# Patient Record
Sex: Male | Born: 1955
Health system: Southern US, Community
[De-identification: ages and names within clinical notes are randomized; demographics above are authoritative.]

## PROBLEM LIST (undated history)

## (undated) DIAGNOSIS — D6862 Lupus anticoagulant syndrome: Secondary | ICD-10-CM

## (undated) DIAGNOSIS — I10 Essential (primary) hypertension: Secondary | ICD-10-CM

## (undated) DIAGNOSIS — M359 Systemic involvement of connective tissue, unspecified: Secondary | ICD-10-CM

## (undated) DIAGNOSIS — Z87442 Personal history of urinary calculi: Secondary | ICD-10-CM

## (undated) DIAGNOSIS — F419 Anxiety disorder, unspecified: Secondary | ICD-10-CM

## (undated) DIAGNOSIS — I82409 Acute embolism and thrombosis of unspecified deep veins of unspecified lower extremity: Secondary | ICD-10-CM

## (undated) DIAGNOSIS — R519 Headache, unspecified: Secondary | ICD-10-CM

## (undated) DIAGNOSIS — M1990 Unspecified osteoarthritis, unspecified site: Secondary | ICD-10-CM

## (undated) DIAGNOSIS — E119 Type 2 diabetes mellitus without complications: Secondary | ICD-10-CM

## (undated) DIAGNOSIS — G4730 Sleep apnea, unspecified: Secondary | ICD-10-CM

## (undated) DIAGNOSIS — K429 Umbilical hernia without obstruction or gangrene: Secondary | ICD-10-CM

## (undated) DIAGNOSIS — F32A Depression, unspecified: Secondary | ICD-10-CM

## (undated) HISTORY — PX: PLANTAR FASCIA SURGERY: SHX746

## (undated) HISTORY — PX: URETEROSCOPY WITH HOLMIUM LASER LITHOTRIPSY: SHX6645

## (undated) HISTORY — PX: ROTATOR CUFF REPAIR: SHX139

## (undated) HISTORY — PX: THUMB FUSION: SUR636

## (undated) HISTORY — PX: CERVICAL FUSION: SHX112

---

## 2005-12-01 ENCOUNTER — Inpatient Hospital Stay: Payer: Self-pay | Admitting: Internal Medicine

## 2007-11-26 ENCOUNTER — Ambulatory Visit: Payer: Self-pay | Admitting: Internal Medicine

## 2009-03-08 ENCOUNTER — Ambulatory Visit: Payer: Self-pay | Admitting: Gastroenterology

## 2010-01-05 ENCOUNTER — Ambulatory Visit: Payer: Self-pay | Admitting: Orthopedic Surgery

## 2010-03-29 ENCOUNTER — Ambulatory Visit: Payer: Self-pay | Admitting: Oncology

## 2010-04-29 LAB — HEXAGONAL PHOSPHOLIPID NEUTRALIZATION

## 2010-05-30 ENCOUNTER — Ambulatory Visit: Payer: Self-pay | Admitting: Oncology

## 2010-06-03 LAB — CBC WITH DIFFERENTIAL/PLATELET
BASO%: 1.3 % (ref 0.0–2.0)
Basophils Absolute: 0.1 10*3/uL (ref 0.0–0.1)
EOS%: 2.8 % (ref 0.0–7.0)
Eosinophils Absolute: 0.2 10*3/uL (ref 0.0–0.5)
HCT: 49.2 % (ref 38.4–49.9)
HGB: 16.5 g/dL (ref 13.0–17.1)
LYMPH%: 24.1 % (ref 14.0–49.0)
MCH: 30 pg (ref 27.2–33.4)
MCHC: 33.5 g/dL (ref 32.0–36.0)
MCV: 89.5 fL (ref 79.3–98.0)
MONO#: 0.5 10*3/uL (ref 0.1–0.9)
MONO%: 9.7 % (ref 0.0–14.0)
NEUT#: 3.3 10*3/uL (ref 1.5–6.5)
NEUT%: 62.1 % (ref 39.0–75.0)
Platelets: 185 10*3/uL (ref 140–400)
RBC: 5.5 10*6/uL (ref 4.20–5.82)
RDW: 14.6 % (ref 11.0–14.6)
WBC: 5.3 10*3/uL (ref 4.0–10.3)
lymph#: 1.3 10*3/uL (ref 0.9–3.3)
nRBC: 0 % (ref 0–0)

## 2010-06-03 LAB — PROTIME-INR
INR: 2.8 (ref 2.00–3.50)
Protime: 33.6 Seconds — ABNORMAL HIGH (ref 10.6–13.4)

## 2010-06-09 ENCOUNTER — Inpatient Hospital Stay (HOSPITAL_COMMUNITY): Admission: EM | Admit: 2010-06-09 | Discharge: 2010-06-17 | Payer: Self-pay | Admitting: Oncology

## 2010-06-10 ENCOUNTER — Ambulatory Visit: Payer: Self-pay | Admitting: Internal Medicine

## 2010-06-12 ENCOUNTER — Encounter: Payer: Self-pay | Admitting: Neurosurgery

## 2010-06-30 ENCOUNTER — Ambulatory Visit: Payer: Self-pay | Admitting: Oncology

## 2010-07-01 LAB — CBC WITH DIFFERENTIAL/PLATELET
BASO%: 0.8 % (ref 0.0–2.0)
Basophils Absolute: 0.1 10*3/uL (ref 0.0–0.1)
EOS%: 2.7 % (ref 0.0–7.0)
Eosinophils Absolute: 0.2 10*3/uL (ref 0.0–0.5)
HCT: 43.8 % (ref 38.4–49.9)
HGB: 15 g/dL (ref 13.0–17.1)
LYMPH%: 19.8 % (ref 14.0–49.0)
MCH: 29.8 pg (ref 27.2–33.4)
MCHC: 34.2 g/dL (ref 32.0–36.0)
MCV: 86.9 fL (ref 79.3–98.0)
MONO#: 0.5 10*3/uL (ref 0.1–0.9)
MONO%: 6.8 % (ref 0.0–14.0)
NEUT#: 5.2 10*3/uL (ref 1.5–6.5)
NEUT%: 69.9 % (ref 39.0–75.0)
Platelets: 304 10*3/uL (ref 140–400)
RBC: 5.04 10*6/uL (ref 4.20–5.82)
RDW: 13.9 % (ref 11.0–14.6)
WBC: 7.4 10*3/uL (ref 4.0–10.3)
lymph#: 1.5 10*3/uL (ref 0.9–3.3)
nRBC: 0 % (ref 0–0)

## 2010-07-01 LAB — PROTIME-INR
INR: 2.5 (ref 2.00–3.50)
Protime: 30 Seconds — ABNORMAL HIGH (ref 10.6–13.4)

## 2010-09-17 ENCOUNTER — Ambulatory Visit: Payer: Self-pay | Admitting: Internal Medicine

## 2010-10-11 ENCOUNTER — Other Ambulatory Visit: Payer: Self-pay | Admitting: Neurosurgery

## 2010-10-22 LAB — BASIC METABOLIC PANEL
BUN: 13 mg/dL (ref 6–23)
BUN: 9 mg/dL (ref 6–23)
CO2: 29 mEq/L (ref 19–32)
CO2: 29 mEq/L (ref 19–32)
Calcium: 8.9 mg/dL (ref 8.4–10.5)
Calcium: 9.1 mg/dL (ref 8.4–10.5)
Chloride: 102 mEq/L (ref 96–112)
Chloride: 102 mEq/L (ref 96–112)
Creatinine, Ser: 1.27 mg/dL (ref 0.4–1.5)
Creatinine, Ser: 1.34 mg/dL (ref 0.4–1.5)
GFR calc Af Amer: >60 mL/min (ref >60)
GFR calc Af Amer: >60 mL/min (ref >60)
GFR calc non Af Amer: 56 mL/min — ABNORMAL LOW (ref >60)
GFR calc non Af Amer: 59 mL/min — ABNORMAL LOW (ref >60)
Glucose, Bld: 125 mg/dL — ABNORMAL HIGH (ref 70–99)
Glucose, Bld: 161 mg/dL — ABNORMAL HIGH (ref 70–99)
Potassium: 4.3 mEq/L (ref 3.5–5.1)
Potassium: 4.8 mEq/L (ref 3.5–5.1)
Sodium: 136 mEq/L (ref 135–145)
Sodium: 137 mEq/L (ref 135–145)

## 2010-10-22 LAB — HEPARIN LEVEL (UNFRACTIONATED)
Heparin Unfractionated: 0.59 IU/mL (ref 0.30–0.70)
Heparin Unfractionated: <0.1 IU/mL — ABNORMAL LOW (ref 0.30–0.70)
Heparin Unfractionated: <0.1 IU/mL — ABNORMAL LOW (ref 0.30–0.70)
Heparin Unfractionated: <0.1 IU/mL — ABNORMAL LOW (ref 0.30–0.70)
Heparin Unfractionated: <0.1 IU/mL — ABNORMAL LOW (ref 0.30–0.70)

## 2010-10-22 LAB — CBC
HCT: 42.3 % (ref 39.0–52.0)
HCT: 42.4 % (ref 39.0–52.0)
HCT: 43.3 % (ref 39.0–52.0)
HCT: 45.5 % (ref 39.0–52.0)
HCT: 46.1 % (ref 39.0–52.0)
HCT: 47.1 % (ref 39.0–52.0)
HCT: 47.3 % (ref 39.0–52.0)
Hemoglobin: 14 g/dL (ref 13.0–17.0)
Hemoglobin: 14.1 g/dL (ref 13.0–17.0)
Hemoglobin: 14.4 g/dL (ref 13.0–17.0)
Hemoglobin: 15.2 g/dL (ref 13.0–17.0)
Hemoglobin: 15.6 g/dL (ref 13.0–17.0)
Hemoglobin: 15.8 g/dL (ref 13.0–17.0)
Hemoglobin: 15.9 g/dL (ref 13.0–17.0)
MCH: 29.5 pg (ref 26.0–34.0)
MCH: 29.6 pg (ref 26.0–34.0)
MCH: 29.9 pg (ref 26.0–34.0)
MCH: 29.9 pg (ref 26.0–34.0)
MCH: 29.9 pg (ref 26.0–34.0)
MCH: 30 pg (ref 26.0–34.0)
MCH: 30.1 pg (ref 26.0–34.0)
MCHC: 33.1 g/dL (ref 30.0–36.0)
MCHC: 33.3 g/dL (ref 30.0–36.0)
MCHC: 33.3 g/dL (ref 30.0–36.0)
MCHC: 33.4 g/dL (ref 30.0–36.0)
MCHC: 33.4 g/dL (ref 30.0–36.0)
MCHC: 33.8 g/dL (ref 30.0–36.0)
MCHC: 33.8 g/dL (ref 30.0–36.0)
MCV: 87.5 fL (ref 78.0–100.0)
MCV: 88.7 fL (ref 78.0–100.0)
MCV: 88.8 fL (ref 78.0–100.0)
MCV: 89.6 fL (ref 78.0–100.0)
MCV: 89.8 fL (ref 78.0–100.0)
MCV: 90 fL (ref 78.0–100.0)
MCV: 90.2 fL (ref 78.0–100.0)
Platelets: 100 10*3/uL — ABNORMAL LOW (ref 150–400)
Platelets: 114 10*3/uL — ABNORMAL LOW (ref 150–400)
Platelets: 125 10*3/uL — ABNORMAL LOW (ref 150–400)
Platelets: 176 10*3/uL (ref 150–400)
Platelets: 182 10*3/uL (ref 150–400)
Platelets: 90 10*3/uL — ABNORMAL LOW (ref 150–400)
Platelets: 93 10*3/uL — ABNORMAL LOW (ref 150–400)
RBC: 4.69 MIL/uL (ref 4.22–5.81)
RBC: 4.71 MIL/uL (ref 4.22–5.81)
RBC: 4.88 MIL/uL (ref 4.22–5.81)
RBC: 5.08 MIL/uL (ref 4.22–5.81)
RBC: 5.19 MIL/uL (ref 4.22–5.81)
RBC: 5.27 MIL/uL (ref 4.22–5.81)
RBC: 5.38 MIL/uL (ref 4.22–5.81)
RDW: 13.8 % (ref 11.5–15.5)
RDW: 13.8 % (ref 11.5–15.5)
RDW: 13.8 % (ref 11.5–15.5)
RDW: 13.9 % (ref 11.5–15.5)
RDW: 14.1 % (ref 11.5–15.5)
RDW: 14.1 % (ref 11.5–15.5)
RDW: 14.1 % (ref 11.5–15.5)
WBC: 5 10*3/uL (ref 4.0–10.5)
WBC: 6.3 10*3/uL (ref 4.0–10.5)
WBC: 6.4 10*3/uL (ref 4.0–10.5)
WBC: 6.5 10*3/uL (ref 4.0–10.5)
WBC: 6.7 10*3/uL (ref 4.0–10.5)
WBC: 7.8 10*3/uL (ref 4.0–10.5)
WBC: 7.9 10*3/uL (ref 4.0–10.5)

## 2010-10-22 LAB — PROTIME-INR
INR: 0.96 (ref 0.00–1.49)
INR: 1.03 (ref 0.00–1.49)
Prothrombin Time: 13 seconds (ref 11.6–15.2)
Prothrombin Time: 13.7 seconds (ref 11.6–15.2)

## 2010-10-22 LAB — APTT
aPTT: 45 seconds — ABNORMAL HIGH (ref 24–37)
aPTT: 46 seconds — ABNORMAL HIGH (ref 24–37)

## 2010-10-22 LAB — DIFFERENTIAL
Basophils Absolute: 0.1 10*3/uL (ref 0.0–0.1)
Basophils Relative: 1 % (ref 0–1)
Eosinophils Absolute: 0.3 10*3/uL (ref 0.0–0.7)
Eosinophils Relative: 4 % (ref 0–5)
Lymphocytes Relative: 22 % (ref 12–46)
Lymphs Abs: 1.5 10*3/uL (ref 0.7–4.0)
Monocytes Absolute: 0.6 10*3/uL (ref 0.1–1.0)
Monocytes Relative: 9 % (ref 3–12)
Neutro Abs: 4.1 10*3/uL (ref 1.7–7.7)
Neutrophils Relative %: 63 % (ref 43–77)

## 2010-10-22 LAB — HEPARIN INDUCED THROMBOCYTOPENIA PNL
Heparin Induced Plt Ab: NEGATIVE
Patient O.D.: 0.321
UFH High Dose UFH H: 5 % Release
UFH Low Dose 0.1 IU/mL: 2 % Release
UFH Low Dose 0.5 IU/mL: 13 % Release
UFH SRA Result: NEGATIVE

## 2010-10-22 LAB — MRSA PCR SCREENING: MRSA by PCR: NEGATIVE

## 2010-10-23 LAB — TYPE AND SCREEN
ABO/RH(D): A NEG
Antibody Screen: NEGATIVE

## 2010-10-23 LAB — CBC
HCT: 44.5 % (ref 39.0–52.0)
HCT: 46.9 % (ref 39.0–52.0)
Hemoglobin: 15.1 g/dL (ref 13.0–17.0)
Hemoglobin: 15.5 g/dL (ref 13.0–17.0)
MCH: 29.6 pg (ref 26.0–34.0)
MCH: 29.8 pg (ref 26.0–34.0)
MCHC: 33 g/dL (ref 30.0–36.0)
MCHC: 33.9 g/dL (ref 30.0–36.0)
MCV: 87.8 fL (ref 78.0–100.0)
MCV: 89.5 fL (ref 78.0–100.0)
Platelets: 183 10*3/uL (ref 150–400)
Platelets: 184 10*3/uL (ref 150–400)
RBC: 5.07 MIL/uL (ref 4.22–5.81)
RBC: 5.24 MIL/uL (ref 4.22–5.81)
RDW: 13.9 % (ref 11.5–15.5)
RDW: 14.1 % (ref 11.5–15.5)
WBC: 5.2 10*3/uL (ref 4.0–10.5)
WBC: 6.2 10*3/uL (ref 4.0–10.5)

## 2010-10-23 LAB — DIFFERENTIAL
Basophils Absolute: 0.1 10*3/uL (ref 0.0–0.1)
Basophils Relative: 1 % (ref 0–1)
Eosinophils Absolute: 0.2 10*3/uL (ref 0.0–0.7)
Eosinophils Relative: 3 % (ref 0–5)
Lymphocytes Relative: 26 % (ref 12–46)
Lymphs Abs: 1.6 10*3/uL (ref 0.7–4.0)
Monocytes Absolute: 0.7 10*3/uL (ref 0.1–1.0)
Monocytes Relative: 11 % (ref 3–12)
Neutro Abs: 3.7 10*3/uL (ref 1.7–7.7)
Neutrophils Relative %: 60 % (ref 43–77)

## 2010-10-23 LAB — HEPARIN LEVEL (UNFRACTIONATED)
Heparin Unfractionated: 0.19 IU/mL — ABNORMAL LOW (ref 0.30–0.70)
Heparin Unfractionated: 0.23 IU/mL — ABNORMAL LOW (ref 0.30–0.70)
Heparin Unfractionated: 0.3 IU/mL (ref 0.30–0.70)

## 2010-10-23 LAB — COMPREHENSIVE METABOLIC PANEL
ALT: 41 U/L (ref 0–53)
AST: 29 U/L (ref 0–37)
Albumin: 3.6 g/dL (ref 3.5–5.2)
Alkaline Phosphatase: 26 U/L — ABNORMAL LOW (ref 39–117)
BUN: 21 mg/dL (ref 6–23)
CO2: 24 mEq/L (ref 19–32)
Calcium: 9 mg/dL (ref 8.4–10.5)
Chloride: 107 mEq/L (ref 96–112)
Creatinine, Ser: 1.47 mg/dL (ref 0.4–1.5)
GFR calc Af Amer: >60 mL/min (ref >60)
GFR calc non Af Amer: 50 mL/min — ABNORMAL LOW (ref >60)
Glucose, Bld: 130 mg/dL — ABNORMAL HIGH (ref 70–99)
Potassium: 3.7 mEq/L (ref 3.5–5.1)
Sodium: 137 mEq/L (ref 135–145)
Total Bilirubin: 0.9 mg/dL (ref 0.3–1.2)
Total Protein: 5.9 g/dL — ABNORMAL LOW (ref 6.0–8.3)

## 2010-10-23 LAB — PROTIME-INR
INR: 1.08 (ref 0.00–1.49)
INR: 1.08 (ref 0.00–1.49)
Prothrombin Time: 14.2 seconds (ref 11.6–15.2)
Prothrombin Time: 14.2 seconds (ref 11.6–15.2)

## 2010-10-23 LAB — LACTATE DEHYDROGENASE: LDH: 193 U/L (ref 94–250)

## 2010-10-23 LAB — ABO/RH: ABO/RH(D): A NEG

## 2010-10-23 LAB — APTT: aPTT: 50 seconds — ABNORMAL HIGH (ref 24–37)

## 2011-10-22 ENCOUNTER — Ambulatory Visit: Payer: Self-pay | Admitting: Specialist

## 2012-11-06 ENCOUNTER — Ambulatory Visit: Payer: Self-pay | Admitting: Neurosurgery

## 2013-12-21 DIAGNOSIS — M5090 Cervical disc disorder, unspecified, unspecified cervical region: Secondary | ICD-10-CM | POA: Insufficient documentation

## 2013-12-21 DIAGNOSIS — I82409 Acute embolism and thrombosis of unspecified deep veins of unspecified lower extremity: Secondary | ICD-10-CM | POA: Insufficient documentation

## 2013-12-21 DIAGNOSIS — D6862 Lupus anticoagulant syndrome: Secondary | ICD-10-CM | POA: Insufficient documentation

## 2014-05-23 DIAGNOSIS — I10 Essential (primary) hypertension: Secondary | ICD-10-CM | POA: Insufficient documentation

## 2016-01-09 DIAGNOSIS — E782 Mixed hyperlipidemia: Secondary | ICD-10-CM | POA: Insufficient documentation

## 2018-05-25 DIAGNOSIS — E1169 Type 2 diabetes mellitus with other specified complication: Secondary | ICD-10-CM | POA: Insufficient documentation

## 2018-07-01 DIAGNOSIS — M6530 Trigger finger, unspecified finger: Secondary | ICD-10-CM | POA: Insufficient documentation

## 2018-09-28 DIAGNOSIS — D6862 Lupus anticoagulant syndrome: Secondary | ICD-10-CM | POA: Diagnosis not present

## 2018-09-28 DIAGNOSIS — I82401 Acute embolism and thrombosis of unspecified deep veins of right lower extremity: Secondary | ICD-10-CM | POA: Diagnosis not present

## 2018-09-28 DIAGNOSIS — G5702 Lesion of sciatic nerve, left lower limb: Secondary | ICD-10-CM | POA: Diagnosis not present

## 2018-10-07 DIAGNOSIS — G4733 Obstructive sleep apnea (adult) (pediatric): Secondary | ICD-10-CM | POA: Diagnosis not present

## 2019-06-15 DIAGNOSIS — M961 Postlaminectomy syndrome, not elsewhere classified: Secondary | ICD-10-CM | POA: Insufficient documentation

## 2019-07-26 ENCOUNTER — Ambulatory Visit (INDEPENDENT_AMBULATORY_CARE_PROVIDER_SITE_OTHER): Payer: Commercial Managed Care - PPO | Admitting: Pharmacist

## 2019-07-26 ENCOUNTER — Encounter (INDEPENDENT_AMBULATORY_CARE_PROVIDER_SITE_OTHER): Payer: Self-pay

## 2019-07-26 ENCOUNTER — Other Ambulatory Visit: Payer: Self-pay

## 2019-07-26 DIAGNOSIS — Z7901 Long term (current) use of anticoagulants: Secondary | ICD-10-CM | POA: Diagnosis not present

## 2019-07-26 DIAGNOSIS — D6862 Lupus anticoagulant syndrome: Secondary | ICD-10-CM

## 2019-07-26 LAB — POCT INR: INR: 1 — AB (ref 2.0–3.0)

## 2020-03-29 ENCOUNTER — Other Ambulatory Visit: Payer: Self-pay | Admitting: Physician Assistant

## 2020-03-29 DIAGNOSIS — R319 Hematuria, unspecified: Secondary | ICD-10-CM

## 2020-04-10 ENCOUNTER — Other Ambulatory Visit: Payer: Self-pay

## 2020-04-10 ENCOUNTER — Ambulatory Visit
Admission: RE | Admit: 2020-04-10 | Discharge: 2020-04-10 | Disposition: A | Discharge status: Home / Self Care | Payer: Commercial Managed Care - PPO | Source: Ambulatory Visit | Attending: Physician Assistant | Admitting: Physician Assistant

## 2020-04-10 DIAGNOSIS — R319 Hematuria, unspecified: Secondary | ICD-10-CM | POA: Diagnosis present

## 2020-04-10 HISTORY — DX: Systemic involvement of connective tissue, unspecified: M35.9

## 2020-04-10 HISTORY — DX: Type 2 diabetes mellitus without complications: E11.9

## 2020-04-10 HISTORY — DX: Essential (primary) hypertension: I10

## 2020-04-10 MED ORDER — IOHEXOL 300 MG/ML  SOLN
125.0000 mL | Freq: Once | INTRAMUSCULAR | Status: AC | PRN
  Administered 2020-04-10: 125 mL via INTRAVENOUS

## 2020-04-12 ENCOUNTER — Ambulatory Visit: Payer: Commercial Managed Care - PPO | Admitting: Urology

## 2020-04-13 NOTE — Progress Notes (Signed)
04/17/2020 4:32 PM   Eddie Ryan 07-04-1956 [id number]  Referring provider: Mortimer Fries, PA-C [facility] [place],  [place] [address] Chief Complaint  Patient presents with   urothelial lesion    HPI: Eddie Ryan is a 64 y.o. male who presents today for evaluation and management of urothelial lesion.   UA on 03/28/20 noted gross hematuria, >50 RBCs and rare bacteria. Culture grew enterococcus faecalis   CT A/P with and without contrast on 04/10/2020 revealed in addition to a 1.4 cm calculus in the right renal pelvis, there is thickening of the urothelium in the right renal pelvis and a urothelial lesion starting at and extending immediately beyond the right ureteropelvic junction into the proximal third of the right ureter, highly concerning for primary urothelial neoplasm. Multiple Bosniak class 1 and Bosniak class 2 cysts in the kidneys bilaterally, as above.  He reports bleeding for 6-7 months. He reports bleeding for a 2-3 days. Symptoms having been on going since 08/2019. His has taken pain medication to control pain. He reports 1 kidney stone in the past that was managed by Dr. Bernardo Heater 20-25 years ago.   The patient is on Coumadin for lupus anticoagulant.  When he stated surgery in the past, he is had a Lovenox bridge.  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 Patient is unsure if he has been exposed to benzene.  Remote history of smoking, smoked for about 8 years.  He reports that cancer runs heavily on his father's side of the family. His father has pancreatic cancer. He grandfather had prostate cancer. His nephew passed from leukemia. Denies family history of bladder cancer.     PMH: Past Medical History:  Diagnosis Date   Collagen vascular disease ([facility])    Diabetes mellitus without complication ([facility])    Hypertension     Surgical History: History reviewed. No pertinent surgical history.  Home  Medications:  Allergies as of 04/17/2020      Reactions   Losartan Potassium-hctz Other (See Comments)      Medication List       Accurate as of April 17, 2020  4:32 PM. If you have any questions, ask your nurse or doctor.        STOP taking these medications   clindamycin-benzoyl peroxide gel Commonly known as: BENZACLIN Stopped by: Hollice Espy, MD   diclofenac Sodium 1 % Gel Commonly known as: VOLTAREN Stopped by: Hollice Espy, MD   enoxaparin 60 MG/0.6ML injection Commonly known as: LOVENOX Stopped by: Hollice Espy, MD   traMADol 50 MG tablet Commonly known as: ULTRAM Stopped by: Hollice Espy, MD     TAKE these medications   acetaminophen 500 MG tablet Commonly known as: TYLENOL Take by mouth.   ALPRAZolam 0.5 MG tablet Commonly known as: XANAX Take 0.5 mg by mouth 4 (four) times daily as needed.   buPROPion 150 MG 24 hr tablet Commonly known as: WELLBUTRIN XL Take 150 mg by mouth every morning.   Cholecalciferol 50 MCG (2000 UT) Tabs Take by mouth.   Cyanocobalamin 1000 MCG Subl Take by mouth.   diclofenac 50 MG EC tablet Commonly known as: VOLTAREN Take 1 tablet by mouth 2 (two) times daily with a meal.   DSS 100 MG Caps Take by mouth.   ergocalciferol 1.25 MG (50000 UT) capsule Commonly known as: VITAMIN D2 ergocalciferol (vitamin D2) 1,250 mcg (50,000 unit) capsule   escitalopram 20 MG tablet Commonly known as:  LEXAPRO Take by mouth.   gabapentin 300 MG capsule Commonly known as: NEURONTIN Take 300 mg by mouth daily.   loratadine 10 MG tablet Commonly known as: CLARITIN Take by mouth.   metFORMIN 500 MG tablet Commonly known as: GLUCOPHAGE Take 500 mg by mouth 2 (two) times daily.   sildenafil 100 MG tablet Commonly known as: VIAGRA Take by mouth.   telmisartan 80 MG tablet Commonly known as: MICARDIS   V-R MAGNESIUM 250 MG Tabs Generic drug: Magnesium Take by mouth.   warfarin 5 MG tablet Commonly known as:  COUMADIN warfarin 5 mg tablet  TAKE 1 TABLET (5 MG TOTAL) BY MOUTH NIGHTLY AND TAKE 1.5 TABLETS ON SATURDAY       Allergies:  Allergies  Allergen Reactions   Losartan Potassium-Hctz Other (See Comments)    Family History: History reviewed. No pertinent family history.  Social History:  has no history on file for tobacco use, alcohol use, and drug use.   Physical Exam: BP (!) 149/88    Pulse 86    Ht 6\' 2"  (1.88 m)    Wt 230 lb (104.3 kg)    BMI 29.53 kg/m   Constitutional:  Alert and oriented, No acute distress. HEENT: [place] AT, moist mucus membranes.  Trachea midline, no masses. Cardiovascular: No clubbing, cyanosis, or edema. Respiratory: Normal respiratory effort, no increased work of breathing. Skin: No rashes, bruises or suspicious lesions. Neurologic: Grossly intact, no focal deficits, moving all 4 extremities. Psychiatric: Normal mood and affect.  Urinalysis 3-10 RBCs.    CT abdomen pelvis (urogram) performed on 04/10/2020 was personally reviewed today.  Agree with radiologic interpretation.  There is a large at least 2.6 cm lesion involving the proximal third of the right ureter with luminal narrowing highly concerning for upper tract urothelial carcinoma.  There is also a nonobstructing 1.4 cm right renal pelvic stone.  Assessment & Plan:    1. Right renal pelvic stone 1.4 cm right renal calculus, likely cause of intermittent gross hematuria versus #2  Please see below.  We discussed today that we can address the stone at the same time as further intervention.  Risks and benefits of ureteroscopy were reviewed including but not limited to infection, bleeding, pain, ureteral injury which could require open surgery versus prolonged indwelling if ureteral perforation occurs, persistent stone disease, requirement for staged procedure, possible stent, and global anesthesia risks. Patient expressed understanding and desires to proceed with ureteroscopy.  -Urine culture and  cytology sent.   2. Right ureteral mass We discussed the differential diagnosis for this lesion which highly concerning for urothelial carcinoma.  There is no obvious metastatic disease.  I recommended proceeding on a fairly urgent basis once he is cleared from medical standpoint/anticoagulation for direct visualization via ureteroscopy, biopsy, and possible ablation.  We did briefly discuss that if this does in fact represent urothelial carcinoma, standard of care would be neoadjuvant chemo followed by nephro ureterectomy versus ablative therapy depending on if it is high-grade or low-grade.  He understands.  Discussed risk and benefits of ureteroscopy.   Patient is on Coumadin secondary to lupus anticoagulant. Will need Lovenox bridge by Dr. Sabra Heck.   [facility] [address], [place], [place] [address] [phone]  I, Selena Batten, am acting as a scribe for Dr. Hollice Espy.  I have reviewed the above documentation for accuracy and completeness, and I agree with the above.   Hollice Espy, MD  I spent 60 total minutes on the  day of the encounter including pre-visit review of the medical record, face-to-face time with the patient, and post visit ordering of labs/imaging/tests.

## 2020-04-13 NOTE — H&P (View-Only) (Signed)
04/17/2020 4:32 PM   Eddie Ryan Dec 11, 1955 [id number]  Referring provider: Mortimer Fries, PA-C [facility] [place],  [place] [address] Chief Complaint  Patient presents with  . urothelial lesion    HPI: Eddie Ryan is a 64 y.o. male who presents today for evaluation and management of urothelial lesion.   UA on 03/28/20 noted gross hematuria, >50 RBCs and rare bacteria. Culture grew enterococcus faecalis   CT A/P with and without contrast on 04/10/2020 revealed in addition to a 1.4 cm calculus in the right renal pelvis, there is thickening of the urothelium in the right renal pelvis and a urothelial lesion starting at and extending immediately beyond the right ureteropelvic junction into the proximal third of the right ureter, highly concerning for primary urothelial neoplasm. Multiple Bosniak class 1 and Bosniak class 2 cysts in the kidneys bilaterally, as above.  He reports bleeding for 6-7 months. He reports bleeding for a 2-3 days. Symptoms having been on going since 08/2019. His has taken pain medication to control pain. He reports 1 kidney stone in the past that was managed by Dr. Bernardo Heater 20-25 years ago.   The patient is on Coumadin for lupus anticoagulant.  When he stated surgery in the past, he is had a Lovenox bridge.  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 Patient is unsure if he has been exposed to benzene.  Remote history of smoking, smoked for about 8 years.  He reports that cancer runs heavily on his father's side of the family. His father has pancreatic cancer. He grandfather had prostate cancer. His nephew passed from leukemia. Denies family history of bladder cancer.     PMH: Past Medical History:  Diagnosis Date  . Collagen vascular disease ([facility])   . Diabetes mellitus without complication ([facility])   . Hypertension     Surgical History: History reviewed. No pertinent surgical history.  Home  Medications:  Allergies as of 04/17/2020      Reactions   Losartan Potassium-hctz Other (See Comments)      Medication List       Accurate as of April 17, 2020  4:32 PM. If you have any questions, ask your nurse or doctor.        STOP taking these medications   clindamycin-benzoyl peroxide gel Commonly known as: BENZACLIN Stopped by: Hollice Espy, MD   diclofenac Sodium 1 % Gel Commonly known as: VOLTAREN Stopped by: Hollice Espy, MD   enoxaparin 60 MG/0.6ML injection Commonly known as: LOVENOX Stopped by: Hollice Espy, MD   traMADol 50 MG tablet Commonly known as: ULTRAM Stopped by: Hollice Espy, MD     TAKE these medications   acetaminophen 500 MG tablet Commonly known as: TYLENOL Take by mouth.   ALPRAZolam 0.5 MG tablet Commonly known as: XANAX Take 0.5 mg by mouth 4 (four) times daily as needed.   buPROPion 150 MG 24 hr tablet Commonly known as: WELLBUTRIN XL Take 150 mg by mouth every morning.   Cholecalciferol 50 MCG (2000 UT) Tabs Take by mouth.   Cyanocobalamin 1000 MCG Subl Take by mouth.   diclofenac 50 MG EC tablet Commonly known as: VOLTAREN Take 1 tablet by mouth 2 (two) times daily with a meal.   DSS 100 MG Caps Take by mouth.   ergocalciferol 1.25 MG (50000 UT) capsule Commonly known as: VITAMIN D2 ergocalciferol (vitamin D2) 1,250 mcg (50,000 unit) capsule   escitalopram 20 MG tablet Commonly known as:  LEXAPRO Take by mouth.   gabapentin 300 MG capsule Commonly known as: NEURONTIN Take 300 mg by mouth daily.   loratadine 10 MG tablet Commonly known as: CLARITIN Take by mouth.   metFORMIN 500 MG tablet Commonly known as: GLUCOPHAGE Take 500 mg by mouth 2 (two) times daily.   sildenafil 100 MG tablet Commonly known as: VIAGRA Take by mouth.   telmisartan 80 MG tablet Commonly known as: MICARDIS   V-R MAGNESIUM 250 MG Tabs Generic drug: Magnesium Take by mouth.   warfarin 5 MG tablet Commonly known as:  COUMADIN warfarin 5 mg tablet  TAKE 1 TABLET (5 MG TOTAL) BY MOUTH NIGHTLY AND TAKE 1.5 TABLETS ON SATURDAY       Allergies:  Allergies  Allergen Reactions  . Losartan Potassium-Hctz Other (See Comments)    Family History: History reviewed. No pertinent family history.  Social History:  has no history on file for tobacco use, alcohol use, and drug use.   Physical Exam: BP (!) 149/88   Pulse 86   Ht 6\' 2"  (1.88 m)   Wt 230 lb (104.3 kg)   BMI 29.53 kg/m   Constitutional:  Alert and oriented, No acute distress. HEENT: [place] AT, moist mucus membranes.  Trachea midline, no masses. Cardiovascular: No clubbing, cyanosis, or edema. Respiratory: Normal respiratory effort, no increased work of breathing. Skin: No rashes, bruises or suspicious lesions. Neurologic: Grossly intact, no focal deficits, moving all 4 extremities. Psychiatric: Normal mood and affect.  Urinalysis 3-10 RBCs.    CT abdomen pelvis (urogram) performed on 04/10/2020 was personally reviewed today.  Agree with radiologic interpretation.  There is a large at least 2.6 cm lesion involving the proximal third of the right ureter with luminal narrowing highly concerning for upper tract urothelial carcinoma.  There is also a nonobstructing 1.4 cm right renal pelvic stone.  Assessment & Plan:    1. Right renal pelvic stone 1.4 cm right renal calculus, likely cause of intermittent gross hematuria versus #2  Please see below.  We discussed today that we can address the stone at the same time as further intervention.  Risks and benefits of ureteroscopy were reviewed including but not limited to infection, bleeding, pain, ureteral injury which could require open surgery versus prolonged indwelling if ureteral perforation occurs, persistent stone disease, requirement for staged procedure, possible stent, and global anesthesia risks. Patient expressed understanding and desires to proceed with ureteroscopy.  -Urine culture and  cytology sent.   2. Right ureteral mass We discussed the differential diagnosis for this lesion which highly concerning for urothelial carcinoma.  There is no obvious metastatic disease.  I recommended proceeding on a fairly urgent basis once he is cleared from medical standpoint/anticoagulation for direct visualization via ureteroscopy, biopsy, and possible ablation.  We did briefly discuss that if this does in fact represent urothelial carcinoma, standard of care would be neoadjuvant chemo followed by nephro ureterectomy versus ablative therapy depending on if it is high-grade or low-grade.  He understands.  Discussed risk and benefits of ureteroscopy.   Patient is on Coumadin secondary to lupus anticoagulant. Will need Lovenox bridge by Dr. Sabra Heck.   [facility] [address], [place],  [address] [phone]  I, Selena Batten, am acting as a scribe for Dr. Hollice Espy.  I have reviewed the above documentation for accuracy and completeness, and I agree with the above.   Hollice Espy, MD  I spent 60 total minutes on the day of the encounter  including pre-visit review of the medical record, face-to-face time with the patient, and post visit ordering of labs/imaging/tests.

## 2020-04-17 ENCOUNTER — Other Ambulatory Visit: Payer: Self-pay

## 2020-04-17 ENCOUNTER — Ambulatory Visit: Payer: Commercial Managed Care - PPO | Admitting: Urology

## 2020-04-17 ENCOUNTER — Encounter: Payer: Self-pay | Admitting: Urology

## 2020-04-17 ENCOUNTER — Other Ambulatory Visit: Payer: Self-pay | Admitting: Urology

## 2020-04-17 ENCOUNTER — Other Ambulatory Visit: Payer: Self-pay | Admitting: Radiology

## 2020-04-17 VITALS — BP 149/88 | HR 86 | Ht 74.0 in | Wt 230.0 lb

## 2020-04-17 DIAGNOSIS — N2889 Other specified disorders of kidney and ureter: Secondary | ICD-10-CM

## 2020-04-17 DIAGNOSIS — N200 Calculus of kidney: Secondary | ICD-10-CM

## 2020-04-17 DIAGNOSIS — N398 Other specified disorders of urinary system: Secondary | ICD-10-CM | POA: Diagnosis not present

## 2020-04-18 LAB — URINALYSIS, COMPLETE
Bilirubin, UA: NEGATIVE
Ketones, UA: NEGATIVE
Leukocytes,UA: NEGATIVE
Nitrite, UA: NEGATIVE
Protein,UA: NEGATIVE
Specific Gravity, UA: 1.025 (ref 1.005–1.030)
Urobilinogen, Ur: 0.2 mg/dL (ref 0.2–1.0)
pH, UA: 5 (ref 5.0–7.5)

## 2020-04-18 LAB — MICROSCOPIC EXAMINATION: Bacteria, UA: NONE SEEN

## 2020-04-18 LAB — CYTOLOGY - NON PAP

## 2020-04-19 ENCOUNTER — Other Ambulatory Visit: Payer: Self-pay

## 2020-04-19 ENCOUNTER — Encounter
Admission: RE | Admit: 2020-04-19 | Discharge: 2020-04-19 | Disposition: A | Discharge status: Home / Self Care | Payer: Commercial Managed Care - PPO | Source: Ambulatory Visit | Attending: Urology | Admitting: Urology

## 2020-04-19 HISTORY — DX: Sleep apnea, unspecified: G47.30

## 2020-04-19 HISTORY — DX: Anxiety disorder, unspecified: F41.9

## 2020-04-19 HISTORY — DX: Personal history of urinary calculi: Z87.442

## 2020-04-19 HISTORY — DX: Depression, unspecified: F32.A

## 2020-04-19 HISTORY — DX: Acute embolism and thrombosis of unspecified deep veins of unspecified lower extremity: I82.409

## 2020-04-19 NOTE — Patient Instructions (Signed)
Your procedure is scheduled on: 04/26/20 Report to [facility]. To find out your arrival time please call [phone] between 1PM - 3PM on 04/25/20.  Remember: Instructions that are not followed completely may result in serious medical risk, up to and including death, or upon the discretion of your surgeon and anesthesiologist your surgery may need to be rescheduled.     _X__ 1. Do not eat food after midnight the night before your procedure.                 No gum chewing or hard candies. You may drink clear liquids up to 2 hours                 before you are scheduled to arrive for your surgery- DO not drink clear                 liquids within 2 hours of the start of your surgery.                 Clear Liquids include:  water, apple juice without pulp, clear carbohydrate                 drink such as Clearfast or Gatorade, Black Coffee or Tea (Do not add                 anything to coffee or tea). Diabetics water only  __X__2.  On the morning of surgery brush your teeth with toothpaste and water, you                 may rinse your mouth with mouthwash if you wish.  Do not swallow any              toothpaste of mouthwash.     _X__ 3.  No Alcohol for 24 hours before or after surgery.   _X__ 4.  Do Not Smoke or use e-cigarettes For 24 Hours Prior to Your Surgery.                 Do not use any chewable tobacco products for at least 6 hours prior to                 surgery.  ____  5.  Bring all medications with you on the day of surgery if instructed.   __X__  6.  Notify your doctor if there is any change in your medical condition      (cold, fever, infections).     Do not wear jewelry, make-up, hairpins, clips or nail polish. Do not wear lotions, powders, or perfumes.  Do not shave 48 hours prior to surgery. Men may shave face and neck. Do not bring valuables to the hospital.    [facility] is not responsible for any belongings or  valuables.  Contacts, dentures/partials or body piercings may not be worn into surgery. Bring a case for your contacts, glasses or hearing aids, a denture cup will be supplied. Leave your suitcase in the car. After surgery it may be brought to your room. For patients admitted to the hospital, discharge time is determined by your treatment team.   Patients discharged the day of surgery will not be allowed to drive home.   Please read over the following fact sheets that you were given:   MRSA Information  __X__ Take these medicines the morning of surgery with A SIP OF WATER:  1. ALPRAZolam (XANAX) 0.5 MG tablet  2. buPROPion (WELLBUTRIN XL) 150 MG 24 hr tablet  3. escitalopram (LEXAPRO) 20 MG tablet  4. gabapentin (NEURONTIN) 300 MG capsule  5. loratadine (CLARITIN) 10 MG tablet  6. oxyCODONE-acetaminophen (PERCOCET/ROXICET) 5-325 MG tablet IF NEEDED  ____ Fleet Enema (as directed)   ____ Use CHG Soap/SAGE wipes as directed  ____ Use inhalers on the day of surgery  __X__ Stop metformin/Janumet/Farxiga 2 days prior to surgery    ____ Take 1/2 of usual insulin dose the night before surgery. No insulin the morning          of surgery.   __X__ Stop Blood Thinners Coumadin or Warfarin/Plavix/Xarelto/Pleta/Pradaxa/Eliquis/Effient/Aspirin   AS INSTRUCTED AND START LOVENOX BRIDGE   __X__ Stop Anti-inflammatories 7 days before surgery such as Advil, Ibuprofen, Motrin,  BC or Goodies Powder, Naprosyn, Naproxen, Aleve, Aspirin    __X__ Stop all herbal supplements, fish oil or vitamin E until after surgery.    __X__ Bring C-Pap to the hospital.

## 2020-04-20 ENCOUNTER — Other Ambulatory Visit: Payer: Self-pay

## 2020-04-20 ENCOUNTER — Encounter
Admission: RE | Admit: 2020-04-20 | Discharge: 2020-04-20 | Disposition: A | Discharge status: Home / Self Care | Payer: Commercial Managed Care - PPO | Source: Ambulatory Visit | Attending: Urology | Admitting: Urology

## 2020-04-20 DIAGNOSIS — I10 Essential (primary) hypertension: Secondary | ICD-10-CM | POA: Diagnosis not present

## 2020-04-20 DIAGNOSIS — Z01810 Encounter for preprocedural cardiovascular examination: Secondary | ICD-10-CM | POA: Insufficient documentation

## 2020-04-22 LAB — CULTURE, URINE COMPREHENSIVE

## 2020-04-24 ENCOUNTER — Other Ambulatory Visit: Payer: Self-pay

## 2020-04-24 ENCOUNTER — Other Ambulatory Visit
Admission: RE | Admit: 2020-04-24 | Discharge: 2020-04-24 | Disposition: A | Discharge status: Home / Self Care | Payer: Commercial Managed Care - PPO | Source: Ambulatory Visit | Attending: Urology | Admitting: Urology

## 2020-04-24 DIAGNOSIS — Z01812 Encounter for preprocedural laboratory examination: Secondary | ICD-10-CM | POA: Diagnosis present

## 2020-04-24 DIAGNOSIS — Z20822 Contact with and (suspected) exposure to covid-19: Secondary | ICD-10-CM | POA: Diagnosis not present

## 2020-04-25 LAB — SARS CORONAVIRUS 2 (TAT 6-24 HRS): SARS Coronavirus 2: NEGATIVE

## 2020-04-26 ENCOUNTER — Ambulatory Visit: Payer: Commercial Managed Care - PPO | Admitting: Certified Registered"

## 2020-04-26 ENCOUNTER — Ambulatory Visit
Admission: RE | Admit: 2020-04-26 | Discharge: 2020-04-26 | Disposition: A | Discharge status: Home / Self Care | Payer: Commercial Managed Care - PPO | Attending: Urology | Admitting: Urology

## 2020-04-26 ENCOUNTER — Encounter
Admission: RE | Disposition: A | Discharge status: Home / Self Care | Payer: Self-pay | Source: Home / Self Care | Attending: Urology

## 2020-04-26 ENCOUNTER — Encounter: Payer: Self-pay | Admitting: Urology

## 2020-04-26 ENCOUNTER — Ambulatory Visit: Payer: Commercial Managed Care - PPO

## 2020-04-26 ENCOUNTER — Other Ambulatory Visit: Payer: Self-pay

## 2020-04-26 DIAGNOSIS — M359 Systemic involvement of connective tissue, unspecified: Secondary | ICD-10-CM | POA: Insufficient documentation

## 2020-04-26 DIAGNOSIS — R310 Gross hematuria: Secondary | ICD-10-CM | POA: Insufficient documentation

## 2020-04-26 DIAGNOSIS — F329 Major depressive disorder, single episode, unspecified: Secondary | ICD-10-CM | POA: Diagnosis not present

## 2020-04-26 DIAGNOSIS — E119 Type 2 diabetes mellitus without complications: Secondary | ICD-10-CM | POA: Diagnosis not present

## 2020-04-26 DIAGNOSIS — Z7984 Long term (current) use of oral hypoglycemic drugs: Secondary | ICD-10-CM | POA: Insufficient documentation

## 2020-04-26 DIAGNOSIS — Z87442 Personal history of urinary calculi: Secondary | ICD-10-CM | POA: Insufficient documentation

## 2020-04-26 DIAGNOSIS — Z79899 Other long term (current) drug therapy: Secondary | ICD-10-CM | POA: Insufficient documentation

## 2020-04-26 DIAGNOSIS — F419 Anxiety disorder, unspecified: Secondary | ICD-10-CM | POA: Diagnosis not present

## 2020-04-26 DIAGNOSIS — Z981 Arthrodesis status: Secondary | ICD-10-CM | POA: Insufficient documentation

## 2020-04-26 DIAGNOSIS — I10 Essential (primary) hypertension: Secondary | ICD-10-CM | POA: Diagnosis not present

## 2020-04-26 DIAGNOSIS — Z86718 Personal history of other venous thrombosis and embolism: Secondary | ICD-10-CM | POA: Diagnosis not present

## 2020-04-26 DIAGNOSIS — Z87891 Personal history of nicotine dependence: Secondary | ICD-10-CM | POA: Insufficient documentation

## 2020-04-26 DIAGNOSIS — N200 Calculus of kidney: Secondary | ICD-10-CM | POA: Insufficient documentation

## 2020-04-26 DIAGNOSIS — D4122 Neoplasm of uncertain behavior of left ureter: Secondary | ICD-10-CM

## 2020-04-26 DIAGNOSIS — G4730 Sleep apnea, unspecified: Secondary | ICD-10-CM | POA: Diagnosis not present

## 2020-04-26 DIAGNOSIS — N2889 Other specified disorders of kidney and ureter: Secondary | ICD-10-CM | POA: Diagnosis not present

## 2020-04-26 DIAGNOSIS — N201 Calculus of ureter: Secondary | ICD-10-CM | POA: Diagnosis not present

## 2020-04-26 HISTORY — PX: URETERAL BIOPSY: SHX6688

## 2020-04-26 HISTORY — PX: CYSTOSCOPY W/ RETROGRADES: SHX1426

## 2020-04-26 HISTORY — PX: CYSTOSCOPY/URETEROSCOPY/HOLMIUM LASER/STENT PLACEMENT: SHX6546

## 2020-04-26 LAB — GLUCOSE, CAPILLARY
Glucose-Capillary: 154 mg/dL — ABNORMAL HIGH (ref 70–99)
Glucose-Capillary: 172 mg/dL — ABNORMAL HIGH (ref 70–99)

## 2020-04-26 SURGERY — CYSTOSCOPY/URETEROSCOPY/HOLMIUM LASER/STENT PLACEMENT
Anesthesia: General | Laterality: Right

## 2020-04-26 MED ORDER — PHENAZOPYRIDINE HCL 200 MG PO TABS: 200.0000 mg | ORAL_TABLET | Freq: Three times a day (TID) | ORAL | 0 refills | Status: DC | PRN

## 2020-04-26 MED ORDER — ONDANSETRON HCL 4 MG/2ML IJ SOLN
INTRAMUSCULAR | Status: AC
  Filled 2020-04-26: qty 2

## 2020-04-26 MED ORDER — LACTATED RINGERS IV SOLN: INTRAVENOUS | Status: DC | PRN

## 2020-04-26 MED ORDER — SEVOFLURANE IN SOLN
RESPIRATORY_TRACT | Status: AC
  Filled 2020-04-26: qty 250

## 2020-04-26 MED ORDER — CEFAZOLIN SODIUM-DEXTROSE 2-4 GM/100ML-% IV SOLN
INTRAVENOUS | Status: AC
  Filled 2020-04-26: qty 100

## 2020-04-26 MED ORDER — HYDROCODONE-ACETAMINOPHEN 5-325 MG PO TABS: 1.0000 | ORAL_TABLET | Freq: Four times a day (QID) | ORAL | 0 refills | Status: DC | PRN

## 2020-04-26 MED ORDER — FAMOTIDINE 20 MG PO TABS
ORAL_TABLET | ORAL | Status: AC
  Administered 2020-04-26: 20 mg via ORAL
  Filled 2020-04-26: qty 1

## 2020-04-26 MED ORDER — MIDAZOLAM HCL 2 MG/2ML IJ SOLN
INTRAMUSCULAR | Status: DC | PRN
  Administered 2020-04-26: 2 mg via INTRAVENOUS

## 2020-04-26 MED ORDER — DEXAMETHASONE SODIUM PHOSPHATE 10 MG/ML IJ SOLN
INTRAMUSCULAR | Status: DC | PRN
  Administered 2020-04-26: 10 mg via INTRAVENOUS

## 2020-04-26 MED ORDER — FENTANYL CITRATE (PF) 100 MCG/2ML IJ SOLN: 25.0000 ug | INTRAMUSCULAR | Status: DC | PRN

## 2020-04-26 MED ORDER — CEFAZOLIN SODIUM-DEXTROSE 2-4 GM/100ML-% IV SOLN
2.0000 g | INTRAVENOUS | Status: AC
  Administered 2020-04-26: 2 g via INTRAVENOUS

## 2020-04-26 MED ORDER — CHLORHEXIDINE GLUCONATE 0.12 % MT SOLN: 15.0000 mL | Freq: Once | OROMUCOSAL | Status: AC

## 2020-04-26 MED ORDER — OXYBUTYNIN CHLORIDE 5 MG PO TABS: 5.0000 mg | ORAL_TABLET | Freq: Three times a day (TID) | ORAL | 0 refills | Status: DC | PRN

## 2020-04-26 MED ORDER — DEXAMETHASONE SODIUM PHOSPHATE 10 MG/ML IJ SOLN
INTRAMUSCULAR | Status: AC
  Filled 2020-04-26: qty 1

## 2020-04-26 MED ORDER — LIDOCAINE HCL (PF) 2 % IJ SOLN
INTRAMUSCULAR | Status: AC
  Filled 2020-04-26: qty 5

## 2020-04-26 MED ORDER — IOPAMIDOL (ISOVUE-200) INJECTION 41%
INTRAVENOUS | Status: DC | PRN
  Administered 2020-04-26: 40 mL via INTRAVENOUS

## 2020-04-26 MED ORDER — SUCCINYLCHOLINE CHLORIDE 20 MG/ML IJ SOLN
INTRAMUSCULAR | Status: DC | PRN
  Administered 2020-04-26: 100 mg via INTRAVENOUS

## 2020-04-26 MED ORDER — PROPOFOL 10 MG/ML IV BOLUS
INTRAVENOUS | Status: AC
  Filled 2020-04-26: qty 40

## 2020-04-26 MED ORDER — LIDOCAINE HCL (CARDIAC) PF 100 MG/5ML IV SOSY
PREFILLED_SYRINGE | INTRAVENOUS | Status: DC | PRN
  Administered 2020-04-26: 100 mg via INTRAVENOUS

## 2020-04-26 MED ORDER — FAMOTIDINE 20 MG PO TABS: 20.0000 mg | ORAL_TABLET | Freq: Once | ORAL | Status: AC

## 2020-04-26 MED ORDER — ROCURONIUM BROMIDE 10 MG/ML (PF) SYRINGE
PREFILLED_SYRINGE | INTRAVENOUS | Status: AC
  Filled 2020-04-26: qty 10

## 2020-04-26 MED ORDER — MIDAZOLAM HCL 2 MG/2ML IJ SOLN
INTRAMUSCULAR | Status: AC
  Filled 2020-04-26: qty 2

## 2020-04-26 MED ORDER — FENTANYL CITRATE (PF) 100 MCG/2ML IJ SOLN
INTRAMUSCULAR | Status: DC | PRN
Start: 2020-04-26 — End: 2020-04-26
  Administered 2020-04-26: 100 ug via INTRAVENOUS

## 2020-04-26 MED ORDER — SODIUM CHLORIDE 0.9 % IV SOLN: INTRAVENOUS | Status: DC

## 2020-04-26 MED ORDER — ORAL CARE MOUTH RINSE: 15.0000 mL | Freq: Once | OROMUCOSAL | Status: AC

## 2020-04-26 MED ORDER — SUGAMMADEX SODIUM 200 MG/2ML IV SOLN
INTRAVENOUS | Status: DC | PRN
  Administered 2020-04-26: 200 mg via INTRAVENOUS

## 2020-04-26 MED ORDER — CHLORHEXIDINE GLUCONATE 0.12 % MT SOLN
OROMUCOSAL | Status: AC
  Administered 2020-04-26: 15 mL via OROMUCOSAL
  Filled 2020-04-26: qty 15

## 2020-04-26 MED ORDER — ROCURONIUM BROMIDE 100 MG/10ML IV SOLN
INTRAVENOUS | Status: DC | PRN
  Administered 2020-04-26: 70 mg via INTRAVENOUS
  Administered 2020-04-26: 10 mg via INTRAVENOUS

## 2020-04-26 MED ORDER — PROPOFOL 10 MG/ML IV BOLUS
INTRAVENOUS | Status: DC | PRN
  Administered 2020-04-26: 200 mg via INTRAVENOUS

## 2020-04-26 MED ORDER — PHENYLEPHRINE HCL (PRESSORS) 10 MG/ML IV SOLN
INTRAVENOUS | Status: DC | PRN
  Administered 2020-04-26 (×2): 200 ug via INTRAVENOUS

## 2020-04-26 MED ORDER — ONDANSETRON HCL 4 MG/2ML IJ SOLN: 4.0000 mg | Freq: Once | INTRAMUSCULAR | Status: DC | PRN

## 2020-04-26 MED ORDER — FENTANYL CITRATE (PF) 100 MCG/2ML IJ SOLN
INTRAMUSCULAR | Status: AC
  Filled 2020-04-26: qty 2

## 2020-04-26 SURGICAL SUPPLY — 38 items
BAG DRAIN CYSTO-URO LG1000N (MISCELLANEOUS) ×4 IMPLANT
BASKET ZERO TIP 1.9FR (BASKET) ×4 IMPLANT
BRUSH SCRUB EZ  4% CHG (MISCELLANEOUS)
BRUSH SCRUB EZ 1% IODOPHOR (MISCELLANEOUS) ×4 IMPLANT
BRUSH SCRUB EZ 4% CHG (MISCELLANEOUS) IMPLANT
BSKT STON RTRVL ZERO TP 1.9FR (BASKET) ×2
CATH URETL 5X70 OPEN END (CATHETERS) ×4 IMPLANT
CNTNR SPEC 2.5X3XGRAD LEK (MISCELLANEOUS) ×2
CONT SPEC 4OZ STER OR WHT (MISCELLANEOUS) ×2
CONT SPEC 4OZ STRL OR WHT (MISCELLANEOUS) ×2
CONTAINER SPEC 2.5X3XGRAD LEK (MISCELLANEOUS) ×2 IMPLANT
DRAPE UTILITY 15X26 TOWEL STRL (DRAPES) ×4 IMPLANT
DRSG TELFA 4X3 1S NADH ST (GAUZE/BANDAGES/DRESSINGS) ×4 IMPLANT
FIBER LASER TRACTIP 200 (UROLOGICAL SUPPLIES) ×4 IMPLANT
FORCEPS BIOP PIRANHA Y (CUTTING FORCEPS) ×4 IMPLANT
GLOVE BIO SURGEON STRL SZ 6.5 (GLOVE) ×12 IMPLANT
GLOVE BIO SURGEONS STRL SZ 6.5 (GLOVE) ×4
GOWN STRL REUS W/ TWL LRG LVL3 (GOWN DISPOSABLE) ×4 IMPLANT
GOWN STRL REUS W/TWL LRG LVL3 (GOWN DISPOSABLE) ×8
GUIDEWIRE GREEN .038 145CM (MISCELLANEOUS) IMPLANT
GUIDEWIRE STR DUAL SENSOR (WIRE) ×4 IMPLANT
INFUSOR MANOMETER BAG 3000ML (MISCELLANEOUS) ×4 IMPLANT
INTRODUCER DILATOR DOUBLE (INTRODUCER) IMPLANT
KIT TURNOVER CYSTO (KITS) ×4 IMPLANT
NDL SAFETY ECLIPSE 18X1.5 (NEEDLE) ×2 IMPLANT
NEEDLE HYPO 18GX1.5 SHARP (NEEDLE) ×4
PACK CYSTO AR (MISCELLANEOUS) ×4 IMPLANT
SET CYSTO W/LG BORE CLAMP LF (SET/KITS/TRAYS/PACK) ×4 IMPLANT
SHEATH URETERAL 12FRX35CM (MISCELLANEOUS) ×4 IMPLANT
SOL .9 NS 3000ML IRR  AL (IV SOLUTION) ×2
SOL .9 NS 3000ML IRR AL (IV SOLUTION) ×2
SOL .9 NS 3000ML IRR UROMATIC (IV SOLUTION) ×2 IMPLANT
STENT URET 6FRX24 CONTOUR (STENTS) IMPLANT
STENT URET 6FRX26 CONTOUR (STENTS) IMPLANT
SURGILUBE 2OZ TUBE FLIPTOP (MISCELLANEOUS) ×4 IMPLANT
WATER STERILE IRR 1000ML POUR (IV SOLUTION) ×4 IMPLANT
WATER STERILE IRR 3000ML UROMA (IV SOLUTION) ×4 IMPLANT
WIRE AMPLATZ SSTIFF .035X260CM (WIRE) ×4 IMPLANT

## 2020-04-26 NOTE — Anesthesia Preprocedure Evaluation (Signed)
Anesthesia Evaluation  Patient identified by MRN, date of birth, ID band Patient awake    Reviewed: Allergy & Precautions, H&P , NPO status , Patient's Chart, lab work & pertinent test results, reviewed documented beta blocker date and time   Airway Mallampati: II  TM Distance: >3 FB Neck ROM: full    Dental  (+) Teeth Intact   Pulmonary sleep apnea and Continuous Positive Airway Pressure Ventilation , former smoker,    Pulmonary exam normal        Cardiovascular Exercise Tolerance: Good hypertension, On Medications negative cardio ROS Normal cardiovascular exam Rhythm:regular Rate:Normal     Neuro/Psych Anxiety Depression negative neurological ROS  negative psych ROS   GI/Hepatic negative GI ROS, Neg liver ROS,   Endo/Other  negative endocrine ROSdiabetes, Well Controlled, Type 2, Oral Hypoglycemic Agents  Renal/GU negative Renal ROS  negative genitourinary   Musculoskeletal   Abdominal   Peds  Hematology negative hematology ROS (+)   Anesthesia Other Findings Past Medical History: No date: Anxiety No date: Collagen vascular disease ([facility]) No date: Depression No date: Diabetes mellitus without complication ([facility]) No date: DVT (deep venous thrombosis) ([facility]) No date: History of kidney stones No date: Hypertension No date: Sleep apnea Past Surgical History: No date: CERVICAL FUSION No date: PLANTAR FASCIA SURGERY; Bilateral No date: ROTATOR CUFF REPAIR; Left No date: THUMB FUSION No date: URETEROSCOPY WITH HOLMIUM LASER LITHOTRIPSY BMI    Body Mass Index: 29.53 kg/m     Reproductive/Obstetrics negative OB ROS                             Anesthesia Physical Anesthesia Plan  ASA: III  Anesthesia Plan: General ETT   Post-op Pain Management:    Induction:   PONV Risk Score and Plan:   Airway Management Planned:   Additional Equipment:   Intra-op Plan:   Post-operative  Plan:   Informed Consent: I have reviewed the patients History and Physical, chart, labs and discussed the procedure including the risks, benefits and alternatives for the proposed anesthesia with the patient or authorized representative who has indicated his/her understanding and acceptance.     Dental Advisory Given  Plan Discussed with: CRNA  Anesthesia Plan Comments:         Anesthesia Quick Evaluation

## 2020-04-26 NOTE — OR Nursing (Signed)
Per Dr. Erlene Quan, secure chat, she discussed with patient preop regarding resuming coumadin.  Advises patient understands resumption of the med.  Added to med section of discharge instructions for pt to resume the med as per his PCP instructions.

## 2020-04-26 NOTE — OR Nursing (Signed)
Discharge has been pending transportation. °

## 2020-04-26 NOTE — Op Note (Signed)
Date of procedure: 04/26/20  Preoperative diagnosis:  1. Left proximal ureteral mass 2. Large left renal pelvic stone 3. Abnormal urine cytology  Postoperative diagnosis:  1. Left renal pelvic stone 2. Abnormal urine cytology 3. No ureteral mass identified  Procedure: 1. Cystoscopy 2. Bilateral retrograde pyelogram 3. Left ureteroscopy 4. Laser lithotripsy 5. Left ureteral stent placement 6. Basket extraction of stone fragments 7. Left proximal ureteral biopsy 8. Interpretation of fluoroscopy less than 30 minutes  Surgeon: Hollice Espy, MD  Anesthesia: General  Complications: None  Intraoperative findings: No obvious left proximal ureteral pathology identified.  Subtly hyperemic but no tumors masses or lesions appreciated.  Retrograde pyelogram was also unremarkable other than filling defect in the renal pelvis.  Large 1.4 cm left renal pelvic stone obliterated.  All stone fragments removed.  Few small proximal ureteral biopsies performed as precaution in light of suspicious urine cytology.  EBL: minimal  Specimens: Left renal pelvic biopsy, stone fragment  Drains: 6 x 24 French double-J ureteral stent on right  Indication: Eddie Ryan is a 64 y.o. patient with gross hematuria found to have a suspicious appearing left proximal ureteral mass as well as a 1.5 cm renal pelvic stone..  After reviewing the management options for treatment, he elected to proceed with the above surgical procedure(s). We have discussed the potential benefits and risks of the procedure, side effects of the proposed treatment, the likelihood of the patient achieving the goals of the procedure, and any potential problems that might occur during the procedure or recuperation. Informed consent has been obtained.  Description of procedure:  The patient was taken to the operating room and general anesthesia was induced.  The patient was placed in the dorsal lithotomy position, prepped and draped in the  usual sterile fashion, and preoperative antibiotics were administered. A preoperative time-out was performed.   21 [place] scope was advanced per urethra into the bladder.  Careful inspection of the bladder yielded unremarkable findings, no erythema, masses, lesions, or any other concern for tumors or malignancy.  Attention was first turned to the left ureter orifice which was cannulated using a 5 [place] open-ended ureteral catheter.  Gentle retrograde pyelogram was performed on the side which revealed no hydroureteronephrosis or filling defects.  Images were saved to PACS.  The same procedure was performed on the right side.  Interestingly, this retrograde pyelogram was also fairly unremarkable and there was no obvious lesion within the left proximal ureter suggestive of a filling defect.  There was a filling defect in the renal pelvis consistent with known stone.  A wire was in place up to the level of the kidney and snapped in place as a safety wire.  A semirigid ureteroscope was then brought in and advanced up to the level of the proximal ureter.  This went quite easily.  There is no obvious lesions or masses.  The proximal ureter was very slightly erythematous but this was very minor and without previous imaging, would never been considered pathologic.  A second Super Stiff wire was in place up to the level of the kidney through the scope and the scope was removed.  A Cook 12/14 French ureteral access sheath was then placed up to level the proximal ureter and the inner lumen and working wire were removed.  Stool lumen flexible digital ureteroscope was then advanced into the renal pelvis where the stone was encountered.  A 200 m laser fiber was then brought in using settings initially of 0.2 and 40 Hz  and later 1.2 J and 15 Hz, the stone was fragmented into tiny pieces.  The remainder the pieces were then extracted via 1.9 French tipless nitinol basket to the entire renal unit was clear.  A retrograde  pyelogram was performed to create a roadmap.  Each and every calyx was directly visualized and there is no contrast extravasation and no additional fragments identified.  The scope was then backed out into the proximal ureter.  I elected to take a few small biopsies of the urothelium as a precaution especially in the setting of suspicious suspicious urine cytology and CT scan findings.  Took approximately 3 or 4 small biopsies but these were relatively minuscule.  Hemostasis was reasonable.  I then backed the scope to lay down the ureter removing the access sheath along the way.  There are no additional tumors, masses, lesions, or stone fragments identified.  Wire was then backloaded over rigid cystoscope and a 6 x 24 French double-J ureteral stent was advanced over the wire up to level the renal pelvis.  The wire was partially drawn till full coils noted both within the renal pelvis as well as within the bladder.  The bladder was then drained.  The patient was then cleaned and dried, repositioned in supine position, reversed myesthesia, and taken to the PACU in stable condition.  Plan: We will have the patient follow-up with me next week in the office for possibly stent removal pending his pathology results.  If his pathology results are normal, stent will be removed.  I like to follow-up with him with repeat imaging in approximately a month in the form of CT urogram as well as repeat cytology given the negative ureteroscopic findings as long as the biopsies are negative.  Intraoperative findings were discussed with his fiance.  She is agreeable this plan.  Hollice Espy, M.D.

## 2020-04-26 NOTE — Interval H&P Note (Signed)
History and Physical Interval Note:  04/26/2020 7:21 AM  Eddie Ryan  has presented today for surgery, with the diagnosis of right renal stone, possible renal pelvic mass.  The various methods of treatment have been discussed with the patient and family. After consideration of risks, benefits and other options for treatment, the patient has consented to  Procedure(s): CYSTOSCOPY/URETEROSCOPY/HOLMIUM LASER/STENT PLACEMENT (Right) URETERAL BIOPSY with ablation (Right) as a surgical intervention.  The patient's history has been reviewed, patient examined, no change in status, stable for surgery.  I have reviewed the patient's chart and labs.  Questions were answered to the patient's satisfaction.    RRR CTAB  Hollice Espy

## 2020-04-26 NOTE — Anesthesia Procedure Notes (Signed)
Procedure Name: Intubation Performed by: Gaynelle Cage, CRNA Pre-anesthesia Checklist: Patient identified, Emergency Drugs available, Suction available and Patient being monitored Patient Re-evaluated:Patient Re-evaluated prior to induction Oxygen Delivery Method: Circle system utilized Preoxygenation: Pre-oxygenation with 100% oxygen Induction Type: IV induction Laryngoscope Size: McGraph and 4 Grade View: Grade II Tube type: Oral Tube size: 8.0 mm Number of attempts: 1 Airway Equipment and Method: Stylet and Video-laryngoscopy Placement Confirmation: ETT inserted through vocal cords under direct vision,  positive ETCO2 and breath sounds checked- equal and bilateral Secured at: 24 cm Tube secured with: Tape Dental Injury: Teeth and Oropharynx as per pre-operative assessment

## 2020-04-26 NOTE — Anesthesia Postprocedure Evaluation (Signed)
Anesthesia Post Note  Patient: Eddie Ryan  Procedure(s) Performed: CYSTOSCOPY/URETEROSCOPY/HOLMIUM LASER/STENT PLACEMENT (Right ) URETERAL BIOPSY with ablation (Right ) CYSTOSCOPY WITH RETROGRADE PYELOGRAM (Left )  Patient location during evaluation: PACU Anesthesia Type: General Level of consciousness: awake and alert Pain management: pain level controlled Vital Signs Assessment: post-procedure vital signs reviewed and stable Respiratory status: spontaneous breathing, nonlabored ventilation, respiratory function stable and patient connected to nasal cannula oxygen Cardiovascular status: blood pressure returned to baseline and stable Postop Assessment: no apparent nausea or vomiting Anesthetic complications: no   No complications documented.   Last Vitals:  Vitals:   04/26/20 1026 04/26/20 1043  BP: 128/88 133/76  Pulse: 72 66  Resp: 16 (!) 118  Temp:    SpO2: 96% 95%    Last Pain:  Vitals:   04/26/20 1043  TempSrc:   PainSc: 3                  Molli Barrows

## 2020-04-26 NOTE — Discharge Instructions (Addendum)
You have a ureteral stent in place.  This is a tube that extends from your kidney to your bladder.  This may cause urinary bleeding, burning with urination, and urinary frequency.  Please call our office or present to the ED if you develop fevers >101 or pain which is not able to be controlled with oral pain medications.  You may be given either Flomax and/ or ditropan to help with bladder spasms and stent pain in addition to pain medications.   ° °[facility] °[address], [address] °[place], [place] [address] °[phone] ° ° ° °AMBULATORY SURGERY  °DISCHARGE INSTRUCTIONS ° ° °1) The drugs that you were given will stay in your system until tomorrow so for the next 24 hours you should not: ° °A) Drive an automobile °B) Make any legal decisions °C) Drink any alcoholic beverage ° ° °2) You may resume regular meals tomorrow.  Today it is better to start with liquids and gradually work up to solid foods. ° °You may eat anything you prefer, but it is better to start with liquids, then soup and crackers, and gradually work up to solid foods. ° ° °3) Please notify your doctor immediately if you have any unusual bleeding, trouble breathing, redness and pain at the surgery site, drainage, fever, or pain not relieved by medication. ° ° ° °4) Additional Instructions: ° ° ° ° ° ° ° °Please contact your physician with any problems or Same Day Surgery at [phone], Monday through Friday 6 am to 4 pm, or [place] [facility] number at [phone]. °

## 2020-04-26 NOTE — Transfer of Care (Signed)
Immediate Anesthesia Transfer of Care Note  Patient: Eddie Ryan  Procedure(s) Performed: CYSTOSCOPY/URETEROSCOPY/HOLMIUM LASER/STENT PLACEMENT (Right ) URETERAL BIOPSY with ablation (Right ) CYSTOSCOPY WITH RETROGRADE PYELOGRAM (Left )  Patient Location: PACU  Anesthesia Type:General  Level of Consciousness: sedated  Airway & Oxygen Therapy: Patient Spontanous Breathing and Patient connected to face mask oxygen  Post-op Assessment: Report given to RN and Post -op Vital signs reviewed and stable  Post vital signs: Reviewed and stable  Last Vitals:  Vitals Value Taken Time  BP 115/76 04/26/20 0912  Temp    Pulse 70 04/26/20 0912  Resp 13 04/26/20 0912  SpO2 95 % 04/26/20 0912  Vitals shown include unvalidated device data.  Last Pain:  Vitals:   04/26/20 0630  TempSrc: Temporal  PainSc: 1          Complications: No complications documented.

## 2020-04-27 LAB — SURGICAL PATHOLOGY

## 2020-05-02 ENCOUNTER — Encounter: Payer: Commercial Managed Care - PPO | Admitting: Urology

## 2020-05-02 ENCOUNTER — Ambulatory Visit (INDEPENDENT_AMBULATORY_CARE_PROVIDER_SITE_OTHER): Payer: Commercial Managed Care - PPO | Admitting: Urology

## 2020-05-02 ENCOUNTER — Other Ambulatory Visit: Payer: Self-pay

## 2020-05-02 DIAGNOSIS — N398 Other specified disorders of urinary system: Secondary | ICD-10-CM

## 2020-05-02 DIAGNOSIS — N200 Calculus of kidney: Secondary | ICD-10-CM | POA: Diagnosis not present

## 2020-05-02 LAB — CALCULI, WITH PHOTOGRAPH (CLINICAL LAB)
Calcium Oxalate Dihydrate: 50 %
Calcium Oxalate Monohydrate: 50 %
Weight Calculi: 36 mg

## 2020-05-02 MED ORDER — CEPHALEXIN 500 MG PO CAPS: 500.0000 mg | ORAL_CAPSULE | Freq: Three times a day (TID) | ORAL | 0 refills | Status: AC

## 2020-05-02 NOTE — Progress Notes (Signed)
05/02/2020 10:56 AM   Eddie Ryan April 16, 1956 [id number]  Referring provider: Rusty Aus, MD [facility] [place],  [place] [address] Chief Complaint  Patient presents with  . Cysto Stent Removal    HPI: Eddie Ryan is a 64 y.o. male who presents today for a stent removal.   UA on 03/28/20 noted gross hematuria, >50 RBCs and rare bacteria. Culture grew enterococcus faecalis   CT A/P with and without contrast on 04/10/2020 revealed in addition to a 1.4 cm calculus in the right renal pelvis, there is thickening of the urothelium in the right renal pelvis and a urothelial lesion starting at and extending immediately beyond the right ureteropelvic junction into the proximal third of the right ureter, highly concerning for primary urothelial neoplasm. Multiple Bosniak class 1 and Bosniak class 2 cysts in the kidneys bilaterally, as above.  The patient is on Coumadin for lupus anticoagulant.  When he stated surgery in the past, he is had a Lovenox bridge.  S/p left ureteroscopy and laser lithotripsy on 04/26/2020. No obvious left proximal ureteral pathology identified.  Subtly hyperemic but no tumors masses or lesions appreciated.  Retrograde pyelogram was also unremarkable other than filling defect in the renal pelvis.  Large 1.4 cm left renal pelvic stone obliterated.  All stone fragments removed.  Few small proximal ureteral biopsies performed as precaution in light of suspicious urine cytology.  Surgical pathology consistent with partially denuded benign urothelium with mucosal hyperemia.  No evidence of malignancy.  He is having irritation/discomfort from his stent.  No fevers or chills.   PMH: Past Medical History:  Diagnosis Date  . Anxiety   . Collagen vascular disease ([place])   . Depression   . Diabetes mellitus without complication ([place])   . DVT (deep venous thrombosis) ([place])   . History of kidney stones   . Hypertension     . Sleep apnea     Surgical History: Past Surgical History:  Procedure Laterality Date  . CERVICAL FUSION    . CYSTOSCOPY W/ RETROGRADES Left 04/26/2020   Procedure: CYSTOSCOPY WITH RETROGRADE PYELOGRAM;  Surgeon: Hollice Espy, MD;  Location: [facility] ORS;  Service: Urology;  Laterality: Left;  . CYSTOSCOPY/URETEROSCOPY/HOLMIUM LASER/STENT PLACEMENT Right 04/26/2020   Procedure: CYSTOSCOPY/URETEROSCOPY/HOLMIUM LASER/STENT PLACEMENT;  Surgeon: Hollice Espy, MD;  Location: [facility] ORS;  Service: Urology;  Laterality: Right;  . PLANTAR FASCIA SURGERY Bilateral   . ROTATOR CUFF REPAIR Left   . THUMB FUSION    . URETERAL BIOPSY Right 04/26/2020   Procedure: URETERAL BIOPSY with ablation;  Surgeon: Hollice Espy, MD;  Location: [facility] ORS;  Service: Urology;  Laterality: Right;  . URETEROSCOPY WITH HOLMIUM LASER LITHOTRIPSY      Home Medications:  Allergies as of 05/02/2020      Reactions   Losartan Potassium-hctz Other (See Comments)   Unknown reaction.   Morphine And Related Itching   WITH PROLONGED USE      Medication List       Accurate as of May 02, 2020 11:59 PM. If you have any questions, ask your nurse or doctor.        STOP taking these medications   HYDROcodone-acetaminophen 5-325 MG tablet Commonly known as: NORCO/VICODIN Stopped by: Hollice Espy, MD   phenazopyridine 200 MG tablet Commonly known as: Pyridium Stopped by: Hollice Espy, MD     TAKE these medications   ALPRAZolam 0.5 MG tablet Commonly known as: XANAX Take 0.5 mg by mouth in the  morning and at bedtime.   buPROPion 150 MG 24 hr tablet Commonly known as: WELLBUTRIN XL Take 300 mg by mouth daily.   cephALEXin 500 MG capsule Commonly known as: Keflex Take 1 capsule (500 mg total) by mouth 3 (three) times daily for 7 days. Started by: Hollice Espy, MD   diclofenac 50 MG EC tablet Commonly known as: VOLTAREN Take 50 mg by mouth daily.   diclofenac Sodium 1 % Gel Commonly known as:  VOLTAREN Apply 1 g topically 4 (four) times daily as needed (thumb as needed for pain.).   docusate sodium 100 MG capsule Commonly known as: COLACE Take 200 mg by mouth daily.   ergocalciferol 1.25 MG (50000 UT) capsule Commonly known as: VITAMIN D2 Take 50,000 Units by mouth every Monday.   escitalopram 20 MG tablet Commonly known as: LEXAPRO Take 20 mg by mouth daily.   gabapentin 300 MG capsule Commonly known as: NEURONTIN Take 300 mg by mouth daily.   loratadine 10 MG tablet Commonly known as: CLARITIN Take 10 mg by mouth daily.   Magnesium 500 MG Tabs Take 1,000 mg by mouth daily.   metFORMIN 500 MG tablet Commonly known as: GLUCOPHAGE Take 1,000 mg by mouth daily.   oxybutynin 5 MG tablet Commonly known as: DITROPAN Take 1 tablet (5 mg total) by mouth every 8 (eight) hours as needed for bladder spasms.   telmisartan 80 MG tablet Commonly known as: MICARDIS Take 40 mg by mouth every evening.   VITAMIN B-12 PO Take 1 capsule by mouth daily. Pure Encapsulations B12 Folate Capsules   warfarin 5 MG tablet Commonly known as: COUMADIN Take 5 mg by mouth every evening. Take 1.5 tablets (7.5 mg) by mouth on Saturdays in the evening & take 1 tablet (5 mg) by mouth on Sundays, Mondays, Tuesdays, Wednesdays, Thursdays & Fridays.   Zinc 50 MG Tabs Take 50 mg by mouth daily.       Allergies:  Allergies  Allergen Reactions  . Losartan Potassium-Hctz Other (See Comments)    Unknown reaction.  . Morphine And Related Itching    WITH PROLONGED USE    Family History: No family history on file.  Social History:  reports that he has quit smoking. He has never used smokeless tobacco. He reports that he does not drink alcohol and does not use drugs.   Physical Exam:  Constitutional:  Alert and oriented, No acute distress. HEENT: [place] AT, moist mucus membranes.  Trachea midline, no masses. Cardiovascular: No clubbing, cyanosis, or edema. Respiratory: Normal  respiratory effort, no increased work of breathing. Skin: No rashes, bruises or suspicious lesions. Neurologic: Grossly intact, no focal deficits, moving all 4 extremities. Psychiatric: Normal mood and affect.  Urinalysis [place] positive, nitrates WBCs, etc.  Assessment & Plan:    1. Right nephrolithiasis Stent not removed due to UA, urine appears possibly grossly infected and due to the risk for sepsis/bacteremia, will hold off and treat with antibiotics prior to stent removal Will reschedule stent removal for 05/08/2020.  - Keflex 500 mg TID x 7days Rx sent.  -Urine culture sent.  2. Right proximal ureteral mass  No mass identified most likely inflammatory related. Surgical pathology is reassuring Etiology of abnormal urine cytology unclear?  Inflammatory changes We will plan to remove stent and repeat imaging along with cytology to reassess Plan for CT urogram in 6 weeks for reassurance.   Reschedule stent removal   [facility] [address] [place], [place] [address] [phone]  [phone]  Fransico Him, am acting as a scribe for Dr. Hollice Espy.  I have reviewed the above documentation for accuracy and completeness, and I agree with the above.   Hollice Espy, MD

## 2020-05-04 LAB — URINALYSIS, COMPLETE
Bilirubin, UA: NEGATIVE
Glucose, UA: NEGATIVE
Nitrite, UA: POSITIVE — AB
Specific Gravity, UA: >1.03 — ABNORMAL HIGH (ref 1.005–1.030)
Urobilinogen, Ur: 1 mg/dL (ref 0.2–1.0)
pH, UA: 5.5 (ref 5.0–7.5)

## 2020-05-04 LAB — MICROSCOPIC EXAMINATION: RBC, Urine: >30 /hpf — AB (ref 0–2)

## 2020-05-07 ENCOUNTER — Telehealth: Payer: Self-pay

## 2020-05-07 LAB — CULTURE, URINE COMPREHENSIVE

## 2020-05-07 MED ORDER — SULFAMETHOXAZOLE-TRIMETHOPRIM 800-160 MG PO TABS: 1.0000 | ORAL_TABLET | Freq: Two times a day (BID) | ORAL | 0 refills | Status: DC

## 2020-05-07 NOTE — Telephone Encounter (Signed)
-----   Message from Hollice Espy, MD sent at 05/07/2020  4:16 PM EDT ----- Bacteria was resistant to the antibiotics prescribed.  Please change him to Bactrim DS twice daily x7 days.  Also, please reschedule his stent removal to Thursday.  Like him to be on at least a few days antibiotics before you remove it.  Hollice Espy, MD

## 2020-05-07 NOTE — Telephone Encounter (Signed)
Left message on pt home phone and cell. Sent antibiotics to pharm. Moved appt to Thursday am.

## 2020-05-08 ENCOUNTER — Encounter: Payer: Self-pay | Admitting: Urology

## 2020-05-08 MED ORDER — SULFAMETHOXAZOLE-TRIMETHOPRIM 800-160 MG PO TABS: 1.0000 | ORAL_TABLET | Freq: Two times a day (BID) | ORAL | 0 refills | Status: DC

## 2020-05-08 NOTE — Telephone Encounter (Signed)
Notified patient as instructed, patient pleased. Discussed follow-up appointments, patient agrees  

## 2020-05-08 NOTE — Telephone Encounter (Signed)
-----   Message from Hollice Espy, MD sent at 05/07/2020  4:16 PM EDT ----- Bacteria was resistant to the antibiotics prescribed.  Please change him to Bactrim DS twice daily x7 days.  Also, please reschedule his stent removal to Thursday.  Like him to be on at least a few days antibiotics before you remove it.  Hollice Espy, MD

## 2020-05-08 NOTE — Addendum Note (Signed)
Addended by: Alvera Novel on: 05/08/2020 03:55 PM   Modules accepted: Orders

## 2020-05-10 ENCOUNTER — Ambulatory Visit (INDEPENDENT_AMBULATORY_CARE_PROVIDER_SITE_OTHER): Payer: Commercial Managed Care - PPO | Admitting: Urology

## 2020-05-10 ENCOUNTER — Other Ambulatory Visit: Payer: Self-pay

## 2020-05-10 ENCOUNTER — Encounter: Payer: Self-pay | Admitting: Urology

## 2020-05-10 VITALS — BP 123/75 | HR 84 | Ht 74.0 in | Wt 223.0 lb

## 2020-05-10 DIAGNOSIS — N398 Other specified disorders of urinary system: Secondary | ICD-10-CM

## 2020-05-10 DIAGNOSIS — N5203 Combined arterial insufficiency and corporo-venous occlusive erectile dysfunction: Secondary | ICD-10-CM

## 2020-05-10 DIAGNOSIS — N200 Calculus of kidney: Secondary | ICD-10-CM | POA: Diagnosis not present

## 2020-05-10 LAB — URINALYSIS, COMPLETE
Bilirubin, UA: NEGATIVE
Ketones, UA: NEGATIVE
Leukocytes,UA: NEGATIVE
Nitrite, UA: NEGATIVE
Specific Gravity, UA: >1.03 — ABNORMAL HIGH (ref 1.005–1.030)
Urobilinogen, Ur: 0.2 mg/dL (ref 0.2–1.0)
pH, UA: 5.5 (ref 5.0–7.5)

## 2020-05-10 LAB — MICROSCOPIC EXAMINATION
Bacteria, UA: NONE SEEN
RBC, Urine: >30 /hpf — AB (ref 0–2)

## 2020-05-10 NOTE — Progress Notes (Signed)
   05/10/20  CC:  Chief Complaint  Patient presents with  . Cysto Stent Removal    HPI: 64 year old male with possible right proximal ureteral mass status post negative biopsy and right renal pelvic calculus status post ureteroscopy who presents today for stent removal.  Stent removal has been delayed by UTI which is currently being treated.  Urine appears much more clear today.  He is otherwise symptomatic.  He also mentions today that has been struggling with erectile dysfunction would like to address this.   Blood pressure 123/75, pulse 84, height 6\' 2"  (1.88 m), weight 223 lb (101.2 kg). NED. A&Ox3.   No respiratory distress   Abd soft, NT, ND Normal phallus with bilateral descended testicles  Cystoscopy/ Stent removal procedure  Patient identification was confirmed, informed consent was obtained, and patient was prepped using Betadine solution.  Lidocaine jelly was administered per urethral meatus.    Preoperative abx where received prior to procedure.    Procedure: - Flexible cystoscope introduced, without any difficulty.   - Thorough search of the bladder revealed:    normal urethral meatus  Stent seen emanating from right ureteral orifice, grasped with stent graspers, and removed in entirety.     Post-Procedure: - Patient tolerated the procedure well   Assessment/ Plan:    1. Right renal stone Stent removed without difficulty today  Continue/complete antibiotics as previously prescribed  Warning symptoms reviewed  We will follow up with CT scan - Urinalysis, Complete - CT HEMATURIA WORKUP; Future  2. Urothelial lesion Masslike filling defect of the right proximal ureter.  Ureteroscopically unremarkable with negative biopsy, suspect inflammatory  Plan repeat CT urogram and cytology in 6 weeks.  He is agreeable this plan.  3. Combined arterial insufficiency and corporo-venous occlusive erectile dysfunction Defer this discussion today although he was  given a handout about various treatment options, will address at his subsequent appointment   Return for 6wk w/CTscan results.  Hollice Espy, MD

## 2020-06-07 DIAGNOSIS — N200 Calculus of kidney: Secondary | ICD-10-CM | POA: Insufficient documentation

## 2020-06-19 ENCOUNTER — Ambulatory Visit: Payer: Self-pay | Admitting: Urology

## 2020-06-19 NOTE — Progress Notes (Deleted)
06/19/2020 9:30 AM   Eddie Ryan 1955-12-16 [id number]  Referring provider: Rusty Aus, MD [facility] [place],  [place] [address]  No chief complaint on file.   HPI:    PMH: Past Medical History:  Diagnosis Date  . Anxiety   . Collagen vascular disease ([place])   . Depression   . Diabetes mellitus without complication ([place])   . DVT (deep venous thrombosis) ([place])   . History of kidney stones   . Hypertension   . Sleep apnea     Surgical History: Past Surgical History:  Procedure Laterality Date  . CERVICAL FUSION    . CYSTOSCOPY W/ RETROGRADES Left 04/26/2020   Procedure: CYSTOSCOPY WITH RETROGRADE PYELOGRAM;  Surgeon: Hollice Espy, MD;  Location: [facility] ORS;  Service: Urology;  Laterality: Left;  . CYSTOSCOPY/URETEROSCOPY/HOLMIUM LASER/STENT PLACEMENT Right 04/26/2020   Procedure: CYSTOSCOPY/URETEROSCOPY/HOLMIUM LASER/STENT PLACEMENT;  Surgeon: Hollice Espy, MD;  Location: [facility] OR[facility];  Service: Urology;  Laterality: Right;  . PLANTAR FASCIA SURGERY Bilateral   . ROTATOR CUFF REPAIR Left   . THUMB FUSION    . URETERAL BIOPSY Right 04/26/2020   Procedure: URETERAL BIOPSY with ablation;  Surgeon: Hollice Espy, MD;  Location: [facility] OR[facility];  Service: Urology;  Laterality: Right;  . URETEROSCOPY WITH HOLMIUM LASER LITHOTRIPSY      Home Medications:  Allergies as of 06/19/2020      Reactions   Losartan Potassium-hctz Other (See Comments)   Unknown reaction.   Morphine And Related Itching   WITH PROLONGED USE      Medication List       Accurate as of June 19, 2020  9:30 AM. If you have any questions, ask your nurse or doctor.        ALPRAZolam 0.5 MG tablet Commonly known as: XANAX Take 0.5 mg by mouth in the morning and at bedtime.   buPROPion 150 MG 24 hr tablet Commonly known as: WELLBUTRIN XL Take 300 mg by mouth daily.   diclofenac 50 MG EC tablet Commonly known as: VOLTAREN Take 50 mg by mouth  daily.   diclofenac Sodium 1 % Gel Commonly known as: VOLTAREN Apply 1 g topically 4 (four) times daily as needed (thumb as needed for pain.).   docusate sodium 100 MG capsule Commonly known as: COLACE Take 200 mg by mouth daily.   ergocalciferol 1.25 MG (50000 UT) capsule Commonly known as: VITAMIN D2 Take 50,000 Units by mouth every Monday.   escitalopram 20 MG tablet Commonly known as: LEXAPRO Take 20 mg by mouth daily.   gabapentin 300 MG capsule Commonly known as: NEURONTIN Take 300 mg by mouth daily.   loratadine 10 MG tablet Commonly known as: CLARITIN Take 10 mg by mouth daily.   Magnesium 500 MG Tabs Take 1,000 mg by mouth daily.   metFORMIN 500 MG tablet Commonly known as: GLUCOPHAGE Take 1,000 mg by mouth daily.   oxybutynin 5 MG tablet Commonly known as: DITROPAN Take 1 tablet (5 mg total) by mouth every 8 (eight) hours as needed for bladder spasms.   sulfamethoxazole-trimethoprim 800-160 MG tablet Commonly known as: BACTRIM DS Take 1 tablet by mouth every 12 (twelve) hours.   telmisartan 80 MG tablet Commonly known as: MICARDIS Take 40 mg by mouth every evening.   VITAMIN B-12 PO Take 1 capsule by mouth daily. Pure Encapsulations B12 Folate Capsules   warfarin 5 MG tablet Commonly known as: COUMADIN Take 5 mg by mouth every evening. Take 1.5 tablets (  7.5 mg) by mouth on Saturdays in the evening & take 1 tablet (5 mg) by mouth on Sundays, Mondays, Tuesdays, Wednesdays, Thursdays & Fridays.   Zinc 50 MG Tabs Take 50 mg by mouth daily.       Allergies:  Allergies  Allergen Reactions  . Losartan Potassium-Hctz Other (See Comments)    Unknown reaction.  . Morphine And Related Itching    WITH PROLONGED USE    Family History: No family history on file.  Social History:  reports that he has quit smoking. He has never used smokeless tobacco. He reports that he does not drink alcohol and does not use drugs.   Physical Exam: There were no  vitals taken for this visit.  Constitutional:  Alert and oriented, No acute distress. HEENT: [place] AT, moist mucus membranes.  Trachea midline, no masses. Cardiovascular: No clubbing, cyanosis, or edema. Respiratory: Normal respiratory effort, no increased work of breathing. GI: Abdomen is soft, nontender, nondistended, no abdominal masses GU: No CVA tenderness Lymph: No cervical or inguinal lymphadenopathy. Skin: No rashes, bruises or suspicious lesions. Neurologic: Grossly intact, no focal deficits, moving all 4 extremities. Psychiatric: Normal mood and affect.  Laboratory Data: Lab Results  Component Value Date   WBC 7.4 07/01/2010   HGB 15.0 07/01/2010   HCT 43.8 07/01/2010   MCV 86.9 07/01/2010   PLT 304 07/01/2010    Lab Results  Component Value Date   CREATININE 1.34 06/16/2010    No results found for: PSA  No results found for: TESTOSTERONE  No results found for: HGBA1C  Urinalysis    Component Value Date/Time   APPEARANCEUR Cloudy (A) 05/10/2020 0938   GLUCOSEU 1+ (A) 05/10/2020 0938   BILIRUBINUR Negative 05/10/2020 0938   PROTEINUR 2+ (A) 05/10/2020 0938   NITRITE Negative 05/10/2020 0938   LEUKOCYTESUR Negative 05/10/2020 0938    Lab Results  Component Value Date   LABMICR See below: 05/10/2020   WBCUA 6-10 (A) 05/10/2020   LABEPIT 0-10 05/10/2020   BACTERIA None seen 05/10/2020    Pertinent Imaging: *** No results found for this or any previous visit.  No results found for this or any previous visit.  No results found for this or any previous visit.  No results found for this or any previous visit.  No results found for this or any previous visit.  No results found for this or any previous visit.  No results found for this or any previous visit.  No results found for this or any previous visit.   Assessment & Plan:    There are no diagnoses linked to this encounter.  No follow-ups on file.  Hollice Espy, MD  [facility]  [facility] [address] [place], [place] [address] [phone]

## 2020-06-21 ENCOUNTER — Other Ambulatory Visit: Payer: Self-pay

## 2020-06-21 ENCOUNTER — Telehealth: Payer: Self-pay | Admitting: Urology

## 2020-06-21 ENCOUNTER — Ambulatory Visit
Admission: RE | Admit: 2020-06-21 | Discharge: 2020-06-21 | Disposition: A | Discharge status: Home / Self Care | Payer: Commercial Managed Care - PPO | Source: Ambulatory Visit | Attending: Urology | Admitting: Urology

## 2020-06-21 DIAGNOSIS — N200 Calculus of kidney: Secondary | ICD-10-CM | POA: Diagnosis not present

## 2020-06-21 MED ORDER — IOHEXOL 350 MG/ML SOLN
100.0000 mL | Freq: Once | INTRAVENOUS | Status: AC | PRN
  Administered 2020-06-21: 100 mL via INTRAVENOUS

## 2020-06-21 NOTE — Telephone Encounter (Signed)
Called patient regarding CT scan results.  Left message on machine to return call.  Hollice Espy, MD

## 2020-06-22 ENCOUNTER — Other Ambulatory Visit: Payer: Self-pay

## 2020-06-22 ENCOUNTER — Other Ambulatory Visit: Payer: Self-pay | Admitting: Radiology

## 2020-06-22 DIAGNOSIS — N398 Other specified disorders of urinary system: Secondary | ICD-10-CM

## 2020-06-22 DIAGNOSIS — R9341 Abnormal radiologic findings on diagnostic imaging of renal pelvis, ureter, or bladder: Secondary | ICD-10-CM

## 2020-06-22 DIAGNOSIS — N200 Calculus of kidney: Secondary | ICD-10-CM

## 2020-06-22 NOTE — Telephone Encounter (Signed)
CT scan results discussed with patient.  He has an enlarging right proximal ureteral mass.  We discussed that this was not seen on ureteroscopy and mucosal biopsies of this area were negative.  Despite this, the lesion appears to be enlarging without evidence of metastatic disease/lymphadenopathy.  Case was discussed extensively with interventional radiology, Dr. Yehuda Mao as well as Dr. Rainey Pines.  They agree that they may be able to percutaneously biopsy the lesion given that is not intraluminal.  In order to facilitate this and avoid injury to the ureter itself, they prefer to have a ureteral stent placed at the time of biopsy.  I called the patient to discuss this is a plan.  He was agreeable.  If are going to be going to the operating room to place a stent, I like to do a second look ureteroscopy to ensure that the lesion does not eroded into the mucosa at this point time given its fairly significant interval growth.  He is agreeable with this as well.  As per on previous occasion, he will need a Lovenox bridge which was managed by his primary care physician, Dr. Sabra Heck.  We will reach out to him to help make arrangements.  All questions were answered today.  Hollice Espy, MD

## 2020-06-22 NOTE — Telephone Encounter (Signed)
Pt returned your call and wanted to let you know he will have his phone by his side all day.

## 2020-06-25 ENCOUNTER — Other Ambulatory Visit: Payer: Self-pay

## 2020-06-25 ENCOUNTER — Other Ambulatory Visit: Payer: Commercial Managed Care - PPO

## 2020-06-26 ENCOUNTER — Encounter
Admission: RE | Admit: 2020-06-26 | Discharge: 2020-06-26 | Disposition: A | Discharge status: Home / Self Care | Payer: Commercial Managed Care - PPO | Source: Ambulatory Visit | Attending: Urology | Admitting: Urology

## 2020-06-26 LAB — MICROSCOPIC EXAMINATION: Bacteria, UA: NONE SEEN

## 2020-06-26 LAB — URINALYSIS, COMPLETE
Bilirubin, UA: NEGATIVE
Ketones, UA: NEGATIVE
Leukocytes,UA: NEGATIVE
Nitrite, UA: NEGATIVE
Protein,UA: NEGATIVE
RBC, UA: NEGATIVE
Specific Gravity, UA: 1.025 (ref 1.005–1.030)
Urobilinogen, Ur: 0.2 mg/dL (ref 0.2–1.0)
pH, UA: 6 (ref 5.0–7.5)

## 2020-06-26 NOTE — Patient Instructions (Addendum)
Your procedure is scheduled on: 07/02/20 Report to [facility]. To find out your arrival time please call [phone] between 1PM - 3PM on 06/29/20 .  Remember: Instructions that are not followed completely may result in serious medical risk, up to and including death, or upon the discretion of your surgeon and anesthesiologist your surgery may need to be rescheduled.     _X__ 1. Do not eat food after midnight the night before your procedure.                 No gum chewing or hard candies. You may drink clear liquids up to 2 hours                 before you are scheduled to arrive for your surgery- DO not drink clear                 liquids within 2 hours of the start of your surgery.                 Clear Liquids include:  water, apple juice without pulp, clear carbohydrate                 drink such as Clearfast or Gatorade, Black Coffee or Tea (Do not add                 anything to coffee or tea). Diabetics water only  __X__2.  On the morning of surgery brush your teeth with toothpaste and water, you                 may rinse your mouth with mouthwash if you wish.  Do not swallow any              toothpaste of mouthwash.     _X__ 3.  No Alcohol for 24 hours before or after surgery.   _X__ 4.  Do Not Smoke or use e-cigarettes For 24 Hours Prior to Your Surgery.                 Do not use any chewable tobacco products for at least 6 hours prior to                 surgery.  ____  5.  Bring all medications with you on the day of surgery if instructed.   __X__  6.  Notify your doctor if there is any change in your medical condition      (cold, fever, infections).     Do not wear jewelry, make-up, hairpins, clips or nail polish. Do not wear lotions, powders, or perfumes.  Do not shave 48 hours prior to surgery. Men may shave face and neck. Do not bring valuables to the hospital.    [facility] is not responsible for any belongings  or valuables.  Contacts, dentures/partials or body piercings may not be worn into surgery. Bring a case for your contacts, glasses or hearing aids, a denture cup will be supplied. Leave your suitcase in the car. After surgery it may be brought to your room. For patients admitted to the hospital, discharge time is determined by your treatment team.   Patients discharged the day of surgery will not be allowed to drive home.   Please read over the following fact sheets that you were given:   MRSA Information  __X__ Take these medicines the morning of surgery with A SIP OF WATER:  1. ALPRAZolam (XANAX) 0.5 MG tablet  2. buPROPion (WELLBUTRIN XL) 300 MG 24 hr tablet  3. escitalopram (LEXAPRO) 20 MG tablet  4. loratadine (CLARITIN) 10 MG tablet  5.  6.  ____ Fleet Enema (as directed)   ____ Use CHG Soap/SAGE wipes as directed  ____ Use inhalers on the day of surgery  ____ Stop metformin/Janumet/Farxiga 2 days prior to surgery    ____ Take 1/2 of usual insulin dose the night before surgery. No insulin the morning          of surgery.   __X__ Stop Blood Thinners Coumadin start Lovenox as instructed     Or contact your Surgeon, Cardiologist or Medical Doctor regarding  ability to stop your blood thinners  __X__ Stop Anti-inflammatories 7 days before surgery such as Advil, Ibuprofen, Motrin,  BC or Goodies Powder, Naprosyn, Naproxen, Aleve, Aspirin    __X__ Stop all herbal supplements, fish oil or vitamin E until after surgery.    ____ Bring C-Pap to the hospital.    HOLD ETODOLAC STARTING TODAY 06/26/20

## 2020-06-27 LAB — CULTURE, URINE COMPREHENSIVE

## 2020-06-28 ENCOUNTER — Other Ambulatory Visit
Admission: RE | Admit: 2020-06-28 | Discharge: 2020-06-28 | Disposition: A | Discharge status: Home / Self Care | Payer: Commercial Managed Care - PPO | Source: Ambulatory Visit | Attending: Urology | Admitting: Urology

## 2020-06-28 ENCOUNTER — Other Ambulatory Visit: Payer: Self-pay

## 2020-06-28 DIAGNOSIS — Z20822 Contact with and (suspected) exposure to covid-19: Secondary | ICD-10-CM | POA: Diagnosis not present

## 2020-06-28 DIAGNOSIS — Z01812 Encounter for preprocedural laboratory examination: Secondary | ICD-10-CM | POA: Insufficient documentation

## 2020-06-29 LAB — SARS CORONAVIRUS 2 (TAT 6-24 HRS): SARS Coronavirus 2: NEGATIVE

## 2020-07-01 MED ORDER — SODIUM CHLORIDE 0.9 % IV SOLN: INTRAVENOUS | Status: DC

## 2020-07-01 MED ORDER — CEFAZOLIN SODIUM-DEXTROSE 2-4 GM/100ML-% IV SOLN
2.0000 g | INTRAVENOUS | Status: AC
  Administered 2020-07-02: 2 g via INTRAVENOUS

## 2020-07-01 MED ORDER — FAMOTIDINE 20 MG PO TABS: 20.0000 mg | ORAL_TABLET | Freq: Once | ORAL | Status: AC

## 2020-07-01 MED ORDER — CHLORHEXIDINE GLUCONATE 0.12 % MT SOLN: 15.0000 mL | Freq: Once | OROMUCOSAL | Status: AC

## 2020-07-01 MED ORDER — ORAL CARE MOUTH RINSE: 15.0000 mL | Freq: Once | OROMUCOSAL | Status: AC

## 2020-07-02 ENCOUNTER — Other Ambulatory Visit: Payer: Self-pay

## 2020-07-02 ENCOUNTER — Ambulatory Visit: Payer: Commercial Managed Care - PPO | Admitting: Urgent Care

## 2020-07-02 ENCOUNTER — Other Ambulatory Visit: Payer: Self-pay | Admitting: Radiology

## 2020-07-02 ENCOUNTER — Encounter: Payer: Self-pay | Admitting: Urology

## 2020-07-02 ENCOUNTER — Encounter
Admission: RE | Disposition: A | Discharge status: Home / Self Care | Payer: Self-pay | Source: Home / Self Care | Attending: Urology

## 2020-07-02 ENCOUNTER — Ambulatory Visit
Admission: RE | Admit: 2020-07-02 | Discharge: 2020-07-02 | Disposition: A | Discharge status: Home / Self Care | Payer: Commercial Managed Care - PPO | Attending: Urology | Admitting: Urology

## 2020-07-02 ENCOUNTER — Ambulatory Visit: Payer: Commercial Managed Care - PPO

## 2020-07-02 DIAGNOSIS — Z7901 Long term (current) use of anticoagulants: Secondary | ICD-10-CM | POA: Diagnosis not present

## 2020-07-02 DIAGNOSIS — N2889 Other specified disorders of kidney and ureter: Secondary | ICD-10-CM | POA: Diagnosis present

## 2020-07-02 DIAGNOSIS — Z79899 Other long term (current) drug therapy: Secondary | ICD-10-CM | POA: Insufficient documentation

## 2020-07-02 DIAGNOSIS — Z87891 Personal history of nicotine dependence: Secondary | ICD-10-CM | POA: Insufficient documentation

## 2020-07-02 DIAGNOSIS — D4121 Neoplasm of uncertain behavior of right ureter: Secondary | ICD-10-CM

## 2020-07-02 DIAGNOSIS — N398 Other specified disorders of urinary system: Secondary | ICD-10-CM

## 2020-07-02 DIAGNOSIS — Z7984 Long term (current) use of oral hypoglycemic drugs: Secondary | ICD-10-CM | POA: Diagnosis not present

## 2020-07-02 DIAGNOSIS — R9341 Abnormal radiologic findings on diagnostic imaging of renal pelvis, ureter, or bladder: Secondary | ICD-10-CM

## 2020-07-02 HISTORY — PX: CYSTOSCOPY WITH URETEROSCOPY AND STENT PLACEMENT: SHX6377

## 2020-07-02 LAB — PROTIME-INR
INR: 1 (ref 0.8–1.2)
Prothrombin Time: 12.7 seconds (ref 11.4–15.2)

## 2020-07-02 LAB — GLUCOSE, CAPILLARY
Glucose-Capillary: 114 mg/dL — ABNORMAL HIGH (ref 70–99)
Glucose-Capillary: 91 mg/dL (ref 70–99)
Glucose-Capillary: 97 mg/dL (ref 70–99)

## 2020-07-02 SURGERY — CYSTOURETEROSCOPY, WITH STENT INSERTION
Anesthesia: General | Laterality: Right

## 2020-07-02 MED ORDER — MIDAZOLAM HCL 2 MG/2ML IJ SOLN
INTRAMUSCULAR | Status: AC
  Filled 2020-07-02: qty 2

## 2020-07-02 MED ORDER — ONDANSETRON HCL 4 MG/2ML IJ SOLN: 4.0000 mg | Freq: Once | INTRAMUSCULAR | Status: DC | PRN

## 2020-07-02 MED ORDER — ONDANSETRON HCL 4 MG/2ML IJ SOLN
INTRAMUSCULAR | Status: AC
  Filled 2020-07-02: qty 2

## 2020-07-02 MED ORDER — MIDAZOLAM HCL 2 MG/2ML IJ SOLN
INTRAMUSCULAR | Status: DC | PRN
  Administered 2020-07-02: 2 mg via INTRAVENOUS

## 2020-07-02 MED ORDER — IOHEXOL 180 MG/ML  SOLN
INTRAMUSCULAR | Status: DC | PRN
  Administered 2020-07-02: 40 mL

## 2020-07-02 MED ORDER — FENTANYL CITRATE (PF) 100 MCG/2ML IJ SOLN
INTRAMUSCULAR | Status: DC | PRN
  Administered 2020-07-02 (×2): 50 ug via INTRAVENOUS

## 2020-07-02 MED ORDER — FENTANYL CITRATE (PF) 100 MCG/2ML IJ SOLN: 25.0000 ug | INTRAMUSCULAR | Status: DC | PRN

## 2020-07-02 MED ORDER — CHLORHEXIDINE GLUCONATE 0.12 % MT SOLN
OROMUCOSAL | Status: AC
  Administered 2020-07-02: 15 mL via OROMUCOSAL
  Filled 2020-07-02: qty 15

## 2020-07-02 MED ORDER — CEFAZOLIN SODIUM-DEXTROSE 2-4 GM/100ML-% IV SOLN
INTRAVENOUS | Status: AC
  Filled 2020-07-02: qty 100

## 2020-07-02 MED ORDER — ACETAMINOPHEN 10 MG/ML IV SOLN
INTRAVENOUS | Status: DC | PRN
  Administered 2020-07-02: 1000 mg via INTRAVENOUS

## 2020-07-02 MED ORDER — LIDOCAINE HCL (CARDIAC) PF 100 MG/5ML IV SOSY
PREFILLED_SYRINGE | INTRAVENOUS | Status: DC | PRN
  Administered 2020-07-02: 60 mg via INTRAVENOUS

## 2020-07-02 MED ORDER — FAMOTIDINE 20 MG PO TABS
ORAL_TABLET | ORAL | Status: AC
  Administered 2020-07-02: 20 mg via ORAL
  Filled 2020-07-02: qty 1

## 2020-07-02 MED ORDER — FENTANYL CITRATE (PF) 100 MCG/2ML IJ SOLN
INTRAMUSCULAR | Status: AC
  Filled 2020-07-02: qty 2

## 2020-07-02 MED ORDER — ONDANSETRON HCL 4 MG/2ML IJ SOLN
INTRAMUSCULAR | Status: DC | PRN
  Administered 2020-07-02: 4 mg via INTRAVENOUS

## 2020-07-02 MED ORDER — OXYBUTYNIN CHLORIDE 5 MG PO TABS: 5.0000 mg | ORAL_TABLET | Freq: Three times a day (TID) | ORAL | 0 refills | Status: DC | PRN

## 2020-07-02 MED ORDER — PROPOFOL 10 MG/ML IV BOLUS
INTRAVENOUS | Status: DC | PRN
  Administered 2020-07-02: 150 mg via INTRAVENOUS
  Administered 2020-07-02: 50 mg via INTRAVENOUS

## 2020-07-02 MED ORDER — LIDOCAINE HCL (PF) 2 % IJ SOLN
INTRAMUSCULAR | Status: AC
  Filled 2020-07-02: qty 5

## 2020-07-02 MED ORDER — PHENYLEPHRINE HCL (PRESSORS) 10 MG/ML IV SOLN
INTRAVENOUS | Status: DC | PRN
  Administered 2020-07-02: 100 ug via INTRAVENOUS

## 2020-07-02 MED ORDER — OXYCODONE-ACETAMINOPHEN 5-325 MG PO TABS
1.0000 | ORAL_TABLET | ORAL | 0 refills | Status: DC | PRN
Start: 2020-07-02 — End: 2023-02-18

## 2020-07-02 MED ORDER — TAMSULOSIN HCL 0.4 MG PO CAPS: 0.4000 mg | ORAL_CAPSULE | Freq: Every day | ORAL | 0 refills | Status: DC

## 2020-07-02 MED ORDER — PROPOFOL 10 MG/ML IV BOLUS
INTRAVENOUS | Status: AC
  Filled 2020-07-02: qty 20

## 2020-07-02 SURGICAL SUPPLY — 22 items
BAG DRAIN CYSTO-URO LG1000N (MISCELLANEOUS) ×3 IMPLANT
BRUSH SCRUB EZ  4% CHG (MISCELLANEOUS)
BRUSH SCRUB EZ 4% CHG (MISCELLANEOUS) IMPLANT
CATH URETL 5X70 OPEN END (CATHETERS) ×3 IMPLANT
DRSG TELFA 4X3 1S NADH ST (GAUZE/BANDAGES/DRESSINGS) IMPLANT
FORCEPS BIOP PIRANHA Y (CUTTING FORCEPS) IMPLANT
GLOVE BIO SURGEON STRL SZ 6.5 (GLOVE) ×2 IMPLANT
GLOVE BIO SURGEONS STRL SZ 6.5 (GLOVE) ×1
GOWN STRL REUS W/ TWL LRG LVL3 (GOWN DISPOSABLE) ×2 IMPLANT
GOWN STRL REUS W/TWL LRG LVL3 (GOWN DISPOSABLE) ×6
GUIDEWIRE GREEN .038 145CM (MISCELLANEOUS) ×3 IMPLANT
GUIDEWIRE STR DUAL SENSOR (WIRE) ×3 IMPLANT
INFUSOR MANOMETER BAG 3000ML (MISCELLANEOUS) ×3 IMPLANT
KIT TURNOVER CYSTO (KITS) ×3 IMPLANT
NDL SAFETY ECLIPSE 18X1.5 (NEEDLE) IMPLANT
NEEDLE HYPO 18GX1.5 SHARP (NEEDLE)
PACK CYSTO AR (MISCELLANEOUS) ×3 IMPLANT
SET CYSTO W/LG BORE CLAMP LF (SET/KITS/TRAYS/PACK) ×3 IMPLANT
STENT URET 6FRX26 CONTOUR (STENTS) ×3 IMPLANT
SURGILUBE 2OZ TUBE FLIPTOP (MISCELLANEOUS) ×3 IMPLANT
WATER STERILE IRR 1000ML POUR (IV SOLUTION) ×3 IMPLANT
WATER STERILE IRR 3000ML UROMA (IV SOLUTION) IMPLANT

## 2020-07-02 NOTE — Anesthesia Procedure Notes (Signed)
Procedure Name: LMA Insertion Date/Time: 07/02/2020 4:21 PM Performed by: Lerry Liner, CRNA Pre-anesthesia Checklist: Patient identified, Emergency Drugs available, Suction available and Patient being monitored Patient Re-evaluated:Patient Re-evaluated prior to induction Oxygen Delivery Method: Circle system utilized Preoxygenation: Pre-oxygenation with 100% oxygen Induction Type: IV induction Ventilation: Mask ventilation without difficulty LMA: LMA inserted LMA Size: 5.0 Number of attempts: 1 Tube secured with: Tape Dental Injury: Teeth and Oropharynx as per pre-operative assessment

## 2020-07-02 NOTE — Discharge Instructions (Signed)
You have a ureteral stent in place.  This is a tube that extends from your kidney to your bladder.  This may cause urinary bleeding, burning with urination, and urinary frequency.  Please call our office or present to the ED if you develop fevers >101 or pain which is not able to be controlled with oral pain medications.  You may be given either Flomax and/ or ditropan to help with bladder spasms and stent pain in addition to pain medications.    You are on a Lovenox bridge.  You may take your Lovenox this evening as well as tomorrow evening.  On Wednesday morning, please do not take your Lovenox for the biopsy procedure in interventional radiology.  Once this is complete, you may resume all oral meds as prescribed by your primary care physician.  [facility] [address] [place], [place] [address] [phone]    AMBULATORY SURGERY  DISCHARGE INSTRUCTIONS   1) The drugs that you were given will stay in your system until tomorrow so for the next 24 hours you should not:  A) Drive an automobile B) Make any legal decisions C) Drink any alcoholic beverage   2) You may resume regular meals tomorrow.  Today it is better to start with liquids and gradually work up to solid foods.  You may eat anything you prefer, but it is better to start with liquids, then soup and crackers, and gradually work up to solid foods.   3) Please notify your doctor immediately if you have any unusual bleeding, trouble breathing, redness and pain at the surgery site, drainage, fever, or pain not relieved by medication.  4) Your post-operative visit with Dr.                                     is: Date:                        Time:    Please call to schedule your post-operative visit.  5) Additional Instructions:

## 2020-07-02 NOTE — Op Note (Signed)
Date of procedure: 07/02/20  Preoperative diagnosis:  1. Right ureteral mass  Postoperative diagnosis:  1. Same as above  Procedure: 1. Cystoscopy 2. Right retrograde pyelogram 3. Right diagnostic ureteroscopy 4. Right ureteral stent placement  Surgeon: Hollice Espy, MD  Anesthesia: General  Complications: None  Intraoperative findings: Slight narrowing of ureter, several centimeters in length the location of tumor corresponding to CT scan but no obvious filling defect.  Normal ureteroscopy with normal urothelium.  No intraluminal ureteral tumor identified.  Stent placed.  EBL: Minimal  Specimens: None  Drains: 6 x 26 French double-J ureteral stent on right  Indication: Eddie Ryan is a 64 y.o. patient with enlarging right proximal ureteral mass.  After reviewing the management options for treatment, he elected to proceed with the above surgical procedure(s). We have discussed the potential benefits and risks of the procedure, side effects of the proposed treatment, the likelihood of the patient achieving the goals of the procedure, and any potential problems that might occur during the procedure or recuperation. Informed consent has been obtained.  Description of procedure:  The patient was taken to the operating room and general anesthesia was induced.  The patient was placed in the dorsal lithotomy position, prepped and draped in the usual sterile fashion, and preoperative antibiotics were administered. A preoperative time-out was performed.   A 21 French scope was advanced per urethra into the bladder.  The bladder was inspected.  It was unremarkable without tumors masses or lesions.  Attention was turned to the right ureteral orifice.  The UO was intubated using a 5 [place] open-ended ureteral catheter.  Retrograde pyelogram was performed using straight of contrast.  This revealed no hydroureteronephrosis or filling defects.  There was some slight narrowing of the ureter,  several centimeters in length corresponding to the location of the tumor on CT scan but this was not a filling defect per se.  The upper tract proximal to this was nondilated.  At this point time, the sensor wire was placed up to the level of the kidney which was snapped in place as a safety wire.  A semirigid ureteroscope was then brought in and was able to be advanced up to the proximal ureter almost to the level of the UPJ.  This was in fact completely normal.  The ureter was slightly more narrow at this location but the urothelium was intact and was essentially unremarkable.  There is no papillary tumors, mucosal changes, or any other indication that the tumor was intraluminal.  Pictures were taken printed and sent with the patient's chart.  Next, a Super Stiff wire was placed through the ureteroscope.  The semirigid was removed and a 7 [place] digital flexible ureteroscope was advanced over this wire up to the level of the renal pelvis which went easily under fluoroscopic guidance.  The wire was withdrawn.  Each and every calyx was then directly visualized.  There were no stones, tumors, lesions or any other abnormal findings throughout the entirety of the upper tract collecting system.  The scope was then backed down the length of the ureter along side of the safety wire.  Again, the proximal ureter was essentially normal as previously described.  Finally, the safety wire was backloaded over rigid cystoscope and a 6 x 26 French double-J ureteral stent was placed.  The wire was withdrawn a full coils noted both within the renal pelvis as well as within the bladder.  The bladder was then drained.  The patient was then cleaned  and dried, repositioned in supine position, reversed from anesthesia, and taken to the PACU in stable condition.  Plan: Intraoperative findings were discussed with his wife.  No intraluminal ureteral tumor was identified.  He is currently on a Lovenox bridge and is scheduled to undergo  CT-guided biopsy with interventional radiology on Wednesday.  We will have him return to the office next week possibly for cystoscopy, stent removal and to review pathology.  Hollice Espy, M.D.

## 2020-07-02 NOTE — H&P (Signed)
Chief Complaint: Enlarging right proximal ureteral mass  History of Present Illness: Eddie Ryan is a 64 y.o. year old with enlarging right proximal ureteral mass.  Please see previous notes for details.  Follow-up CT scan performed last week shows that the mass of the right proximal ureter appears to be enlarging.  He previously underwent right ureteroscopy with laser lithotripsy of an renal pelvic stone as well as biopsy at which time no intraluminal mass was identified.  He did have an abnormal urine cytology.  Mucosal biopsies were negative.  Follow-up CT scan indicates that the mass the right proximal ureter is in fact enlarging.  No lymphadenopathy.  Case discussed with interventional radiology.  Plan for second look ureteroscopy today and possible interventional biopsy after stent placement.  All questions answered.  Lovenox bridge arrange for this procedure today.  Past Medical History:  Diagnosis Date  . Anxiety   . Collagen vascular disease ([place])   . Depression   . Diabetes mellitus without complication ([place])   . DVT (deep venous thrombosis) ([place])   . History of kidney stones   . Hypertension   . Sleep apnea     Past Surgical History:  Procedure Laterality Date  . CERVICAL FUSION    . CYSTOSCOPY W/ RETROGRADES Left 04/26/2020   Procedure: CYSTOSCOPY WITH RETROGRADE PYELOGRAM;  Surgeon: Hollice Espy, MD;  Location: [facility];  Service: Urology;  Laterality: Left;  . CYSTOSCOPY/URETEROSCOPY/HOLMIUM LASER/STENT PLACEMENT Right 04/26/2020   Procedure: CYSTOSCOPY/URETEROSCOPY/HOLMIUM LASER/STENT PLACEMENT;  Surgeon: Hollice Espy, MD;  Location: [facility];  Service: Urology;  Laterality: Right;  . PLANTAR FASCIA SURGERY Bilateral   . ROTATOR CUFF REPAIR Left   . THUMB FUSION    . URETERAL BIOPSY Right 04/26/2020   Procedure: URETERAL BIOPSY with ablation;  Surgeon: Hollice Espy, MD;  Location: [facility];  Service: Urology;  Laterality: Right;  . URETEROSCOPY  WITH HOLMIUM LASER LITHOTRIPSY      Home Medications:  Current Meds  Medication Sig  . ALPRAZolam (XANAX) 0.5 MG tablet Take 0.5 mg by mouth in the morning and at bedtime.   Marland Kitchen buPROPion (WELLBUTRIN XL) 150 MG 24 hr tablet Take 300 mg by mouth daily.   . Cyanocobalamin (VITAMIN B-12 PO) Take 1 capsule by mouth daily. Pure Encapsulations B12 Folate Capsules  . diclofenac Sodium (VOLTAREN) 1 % GEL Apply 1 g topically 4 (four) times daily as needed (thumb as needed for pain.).  Marland Kitchen docusate sodium (COLACE) 100 MG capsule Take 200 mg by mouth daily.  Marland Kitchen enoxaparin (LOVENOX) 40 MG/0.4ML injection Inject 40 mg into the skin daily.  . ergocalciferol (VITAMIN D2) 1.25 MG (50000 UT) capsule Take 50,000 Units by mouth every Monday.   . escitalopram (LEXAPRO) 20 MG tablet Take 20 mg by mouth daily.   Marland Kitchen etodolac (LODINE) 400 MG tablet Take 400 mg by mouth daily with breakfast.  . glipiZIDE (GLUCOTROL XL) 10 MG 24 hr tablet Take 10 mg by mouth daily with breakfast.  . loratadine (CLARITIN) 10 MG tablet Take 10 mg by mouth daily.   . Magnesium 500 MG TABS Take 1,000 mg by mouth daily.  Marland Kitchen telmisartan (MICARDIS) 80 MG tablet Take 40 mg by mouth every evening.   . warfarin (COUMADIN) 5 MG tablet Take 5 mg by mouth every evening. Take 1.5 tablets (7.5 mg) by mouth on Saturdays in the evening & take 1 tablet (5 mg) by mouth on Sundays, Mondays, Tuesdays, Wednesdays, Thursdays & Fridays.  . [DISCONTINUED]  Zinc 50 MG TABS Take 50 mg by mouth daily.    Allergies:  Allergies  Allergen Reactions  . Losartan Potassium-Hctz Other (See Comments)    Unknown reaction.  . Morphine And Related Itching    WITH PROLONGED USE    History reviewed. No pertinent family history.  Social History:  reports that he has quit smoking. He has never used smokeless tobacco. He reports that he does not drink alcohol and does not use drugs.  ROS: A complete review of systems was performed.  All systems are negative except for  pertinent findings as noted.  Physical Exam:  Vital signs in last 24 hours: Temp:  [97 F (36.1 C)] 97 F (36.1 C) (11/22 1129) Pulse Rate:  [72] 72 (11/22 1129) Resp:  [18] 18 (11/22 1129) BP: (121)/(87) 121/87 (11/22 1129) SpO2:  [99 %] 99 % (11/22 1129) Weight:  [104.3 kg] 104.3 kg (11/22 1129) Constitutional:  Alert and oriented, No acute distress HEENT: [place] AT, moist mucus membranes.  Trachea midline, no masses Cardiovascular: Regular rate and rhythm, no clubbing, cyanosis, or edema. Respiratory: Normal respiratory effort, lungs clear bilaterally Neurologic: Grossly intact, no focal deficits, moving all 4 extremities Psychiatric: Normal mood and affect   Laboratory Data:  No results for input(s): WBC, HGB, HCT in the last 72 hours. No results for input(s): NA, K, CL, CO2, GLUCOSE, BUN, CREATININE, CALCIUM in the last 72 hours. Recent Labs    07/02/20 1202  INR 1.0   No results for input(s): LABURIN in the last 72 hours. Results for orders placed or performed during the hospital encounter of 06/28/20  SARS CORONAVIRUS 2 (TAT 6-24 HRS) Nasopharyngeal Nasopharyngeal Swab     Status: None   Collection Time: 06/28/20 11:47 AM   Specimen: Nasopharyngeal Swab  Result Value Ref Range Status   SARS Coronavirus 2 NEGATIVE NEGATIVE Final    Comment: (NOTE) SARS-CoV-2 target nucleic acids are NOT DETECTED.  The SARS-CoV-2 RNA is generally detectable in upper and lower respiratory specimens during the acute phase of infection. Negative results do not preclude SARS-CoV-2 infection, do not rule out co-infections with other pathogens, and should not be used as the sole basis for treatment or other patient management decisions. Negative results must be combined with clinical observations, patient history, and epidemiological information. The expected result is Negative.  Fact Sheet for Patients: SugarRoll.be  Fact Sheet for Healthcare  Providers: https://www.woods-mathews.com/  This test is not yet approved or cleared by the [place] FDA and  has been authorized for detection and/or diagnosis of SARS-CoV-2 by FDA under an Emergency Use Authorization (EUA). This EUA will remain  in effect (meaning this test can be used) for the duration of the COVID-19 declaration under Se ction 564(b)(1) of the Act, 21 U.S.C. section 360bbb-3(b)(1), unless the authorization is terminated or revoked sooner.  Performed at [facility], [place] [address] [place], [place] [address]    CLINICAL DATA:  Right ureteral mass.  EXAM: CT ABDOMEN AND PELVIS WITHOUT AND WITH CONTRAST  TECHNIQUE: Multidetector CT imaging of the abdomen and pelvis was performed following the standard protocol before and following the bolus administration of intravenous contrast.  CONTRAST:  159mL OMNIPAQUE IOHEXOL 350 MG/ML SOLN  COMPARISON:  04/10/2020  FINDINGS: Lower chest: No acute abnormality.  Hepatobiliary: No focal liver abnormality is seen. No gallstones, gallbladder wall thickening, or biliary dilatation.  Pancreas: Unremarkable. No pancreatic ductal dilatation or surrounding inflammatory changes.  Spleen: Normal in size without focal abnormality.  Adrenals/Urinary  Tract: Normal appearance of the adrenal glands.  Bilateral kidney cysts. The largest arises from the posterolateral cortex of the right inferior pole measuring 5 cm, image 46/9. No nephrolithiasis identified bilaterally.  Enhancing soft tissue lesion involving the proximal right ureter measures 2.3 x 2.3 by 3.5 cm, image 55/17 and image 63/2. Additional, mild circumferential soft tissue thickening of the proximal ureter extends for approximately 2.6 cm, image 64/20. Previously this lesion measured 1.5 x 1.1 by 2.9 cm. No additional urinary tract lesions identified. Bladder unremarkable.1  Stomach/Bowel: Stomach is unremarkable. The appendix  is visualized and appears normal. No bowel wall thickening, inflammation, or distension.  Vascular/Lymphatic: Aortic atherosclerosis. No aneurysm. No retroperitoneal adenopathy identified. Left external iliac lymph node measures 1.2 cm short axis, image 77/9. Unchanged from previous exam. No inguinal adenopathy.  Reproductive: Prostate is unremarkable.  Other: Fat containing left inguinal hernia. Small fat containing periumbilical hernia is noted measuring 2.8 cm. No free fluid or fluid collections.  Musculoskeletal: No acute or significant osseous findings. Degenerative disc disease noted at L5-S1.  IMPRESSION: 1. Interval increase in size of enhancing soft tissue mass involving the proximal right ureter compatible with urothelial neoplasm. 2. Unchanged mildly enlarged left external iliac lymph node. No retroperitoneal adenopathy noted. 3. Bilateral kidney cysts. 4. Fat containing left inguinal and periumbilical hernias. 5. Aortic atherosclerosis.  Aortic Atherosclerosis (ICD10-I70.0).   Electronically Signed   By: Kerby Moors M.D.   On: 06/21/2020 10:24   Impression/ Plan:   1.  Right proximal ureteral mass  Plan as outlined above  Risk of bleeding infection, damage surrounding structures amongst others all discussed.  All questions answered.  07/02/2020, 4:01 PM  Hollice Espy,  MD

## 2020-07-02 NOTE — Anesthesia Postprocedure Evaluation (Signed)
Anesthesia Post Note  Patient: Eddie Ryan  Procedure(s) Performed: CYSTOSCOPY WITH URETEROSCOPY AND STENT PLACEMENT (Right )  Patient location during evaluation: PACU Anesthesia Type: General Level of consciousness: awake and alert Pain management: pain level controlled Vital Signs Assessment: post-procedure vital signs reviewed and stable Respiratory status: spontaneous breathing, nonlabored ventilation, respiratory function stable and patient connected to nasal cannula oxygen Cardiovascular status: blood pressure returned to baseline and stable Postop Assessment: no apparent nausea or vomiting Anesthetic complications: no   No complications documented.   Last Vitals:  Vitals:   07/02/20 1729 07/02/20 1743  BP: 117/78 (!) 130/95  Pulse: 67 61  Resp: 15 13  Temp: 36.9 C (!) 36.3 C  SpO2: 93% 95%    Last Pain:  Vitals:   07/02/20 1743  TempSrc:   PainSc: 0-No pain                 Arita Miss

## 2020-07-02 NOTE — Transfer of Care (Signed)
Immediate Anesthesia Transfer of Care Note  Patient: Eddie Ryan  Procedure(s) Performed: CYSTOSCOPY WITH URETEROSCOPY AND STENT PLACEMENT (Right )  Patient Location: PACU  Anesthesia Type:General  Level of Consciousness: drowsy  Airway & Oxygen Therapy: Patient Spontanous Breathing and Patient connected to face mask oxygen  Post-op Assessment: Report given to RN  Post vital signs: stable  Last Vitals:  Vitals Value Taken Time  BP    Temp    Pulse    Resp    SpO2      Last Pain:  Vitals:   07/02/20 1129  TempSrc: Tympanic  PainSc: 0-No pain         Complications: No complications documented.

## 2020-07-02 NOTE — Anesthesia Preprocedure Evaluation (Addendum)
Anesthesia Evaluation  Patient identified by MRN, date of birth, ID band Patient awake    Reviewed: Allergy & Precautions, NPO status , Patient's Chart, lab work & pertinent test results  History of Anesthesia Complications Negative for: history of anesthetic complications  Airway Mallampati: II  TM Distance: >3 FB Neck ROM: Full    Dental no notable dental hx.    Pulmonary sleep apnea and Continuous Positive Airway Pressure Ventilation , neg COPD, former smoker,    breath sounds clear to auscultation- rhonchi (-) wheezing      Cardiovascular hypertension, Pt. on medications (-) CAD, (-) Past MI, (-) Cardiac Stents and (-) CABG  Rhythm:Regular Rate:Normal - Systolic murmurs and - Diastolic murmurs    Neuro/Psych neg Seizures PSYCHIATRIC DISORDERS Anxiety Depression negative neurological ROS     GI/Hepatic negative GI ROS, Neg liver ROS,   Endo/Other  diabetes, Oral Hypoglycemic Agents  Renal/GU negative Renal ROS     Musculoskeletal negative musculoskeletal ROS (+)   Abdominal (+) - obese,   Peds  Hematology negative hematology ROS (+)   Anesthesia Other Findings Past Medical History: No date: Anxiety No date: Collagen vascular disease ([facility]) No date: Depression No date: Diabetes mellitus without complication ([facility]) No date: DVT (deep venous thrombosis) ([facility]) No date: History of kidney stones No date: Hypertension No date: Sleep apnea   Reproductive/Obstetrics                             Anesthesia Physical Anesthesia Plan  ASA: III  Anesthesia Plan: General   Post-op Pain Management:    Induction: Intravenous  PONV Risk Score and Plan: 1 and Ondansetron and Dexamethasone  Airway Management Planned: LMA  Additional Equipment:   Intra-op Plan:   Post-operative Plan:   Informed Consent: I have reviewed the patients History and Physical, chart, labs and discussed the  procedure including the risks, benefits and alternatives for the proposed anesthesia with the patient or authorized representative who has indicated his/her understanding and acceptance.     Dental advisory given  Plan Discussed with: CRNA and Anesthesiologist  Anesthesia Plan Comments:        Anesthesia Quick Evaluation

## 2020-07-03 ENCOUNTER — Ambulatory Visit: Payer: Self-pay | Admitting: Urology

## 2020-07-03 ENCOUNTER — Encounter: Payer: Self-pay | Admitting: Urology

## 2020-07-03 NOTE — Progress Notes (Signed)
Patient on schedule for Ureteral Biopsy 07/04/2020. Called and spoke with patient with pre procedure questions given. Made aware to be here @ 0930, NPO after [place] prior to procedure, as well as have driver for discharge post procedure/discharge. Holding am dose of Glypizide. Stated understanding.

## 2020-07-04 ENCOUNTER — Other Ambulatory Visit: Payer: Self-pay

## 2020-07-04 ENCOUNTER — Ambulatory Visit
Admission: RE | Admit: 2020-07-04 | Discharge: 2020-07-04 | Disposition: A | Discharge status: Home / Self Care | Payer: Commercial Managed Care - PPO | Source: Ambulatory Visit | Attending: Urology | Admitting: Urology

## 2020-07-04 DIAGNOSIS — N398 Other specified disorders of urinary system: Secondary | ICD-10-CM

## 2020-07-04 DIAGNOSIS — Z87891 Personal history of nicotine dependence: Secondary | ICD-10-CM | POA: Insufficient documentation

## 2020-07-04 DIAGNOSIS — N2889 Other specified disorders of kidney and ureter: Secondary | ICD-10-CM | POA: Insufficient documentation

## 2020-07-04 DIAGNOSIS — R9341 Abnormal radiologic findings on diagnostic imaging of renal pelvis, ureter, or bladder: Secondary | ICD-10-CM | POA: Diagnosis not present

## 2020-07-04 LAB — CBC WITH DIFFERENTIAL/PLATELET
Abs Immature Granulocytes: 0.02 10*3/uL (ref 0.00–0.07)
Basophils Absolute: 0 10*3/uL (ref 0.0–0.1)
Basophils Relative: 1 %
Eosinophils Absolute: 0.3 10*3/uL (ref 0.0–0.5)
Eosinophils Relative: 5 %
HCT: 42.4 % (ref 39.0–52.0)
Hemoglobin: 13.9 g/dL (ref 13.0–17.0)
Immature Granulocytes: 0 %
Lymphocytes Relative: 22 %
Lymphs Abs: 1.1 10*3/uL (ref 0.7–4.0)
MCH: 28.9 pg (ref 26.0–34.0)
MCHC: 32.8 g/dL (ref 30.0–36.0)
MCV: 88.1 fL (ref 80.0–100.0)
Monocytes Absolute: 0.4 10*3/uL (ref 0.1–1.0)
Monocytes Relative: 7 %
Neutro Abs: 3.4 10*3/uL (ref 1.7–7.7)
Neutrophils Relative %: 65 %
Platelets: 153 10*3/uL (ref 150–400)
RBC: 4.81 MIL/uL (ref 4.22–5.81)
RDW: 13.9 % (ref 11.5–15.5)
WBC: 5.2 10*3/uL (ref 4.0–10.5)
nRBC: 0 % (ref 0.0–0.2)

## 2020-07-04 LAB — BASIC METABOLIC PANEL
Anion gap: 10 (ref 5–15)
BUN: 17 mg/dL (ref 8–23)
CO2: 25 mmol/L (ref 22–32)
Calcium: 9 mg/dL (ref 8.9–10.3)
Chloride: 103 mmol/L (ref 98–111)
Creatinine, Ser: 1.5 mg/dL — ABNORMAL HIGH (ref 0.61–1.24)
GFR, Estimated: 52 mL/min — ABNORMAL LOW (ref >60)
Glucose, Bld: 142 mg/dL — ABNORMAL HIGH (ref 70–99)
Potassium: 4.3 mmol/L (ref 3.5–5.1)
Sodium: 138 mmol/L (ref 135–145)

## 2020-07-04 LAB — GLUCOSE, CAPILLARY: Glucose-Capillary: 128 mg/dL — ABNORMAL HIGH (ref 70–99)

## 2020-07-04 MED ORDER — MIDAZOLAM HCL 5 MG/5ML IJ SOLN
INTRAMUSCULAR | Status: AC
  Filled 2020-07-04: qty 5

## 2020-07-04 MED ORDER — MIDAZOLAM HCL 2 MG/2ML IJ SOLN
INTRAMUSCULAR | Status: AC | PRN
  Administered 2020-07-04: 2 mg via INTRAVENOUS
  Administered 2020-07-04: 1 mg via INTRAVENOUS

## 2020-07-04 MED ORDER — HEPARIN SOD (PORK) LOCK FLUSH 100 UNIT/ML IV SOLN
INTRAVENOUS | Status: AC
  Filled 2020-07-04: qty 5

## 2020-07-04 MED ORDER — SODIUM CHLORIDE 0.9 % IV SOLN: INTRAVENOUS | Status: DC

## 2020-07-04 MED ORDER — FENTANYL CITRATE (PF) 100 MCG/2ML IJ SOLN
INTRAMUSCULAR | Status: AC | PRN
Start: 2020-07-04 — End: 2020-07-04
  Administered 2020-07-04: 50 ug via INTRAVENOUS
  Administered 2020-07-04: 25 ug via INTRAVENOUS

## 2020-07-04 MED ORDER — FENTANYL CITRATE (PF) 100 MCG/2ML IJ SOLN
INTRAMUSCULAR | Status: AC
  Filled 2020-07-04: qty 2

## 2020-07-04 NOTE — Procedures (Signed)
Interventional Radiology Procedure Note  Procedure: CT guided bx right ureteral mass  Complications: None  Estimated Blood Loss: None  Recommendations: - DC home   Signed,  Criselda Peaches, MD

## 2020-07-04 NOTE — H&P (Signed)
Chief Complaint: Patient was seen in consultation today for right ureteral mass at the request of [place]  Referring Physician(s): Corder  Patient Status: [facility] - Out-pt  History of Present Illness: Eddie Ryan is a 64 y.o. male with an enlarging exophytic mass arising from the right ureter.  Prior ureteroscopy was negative.  He has had a ureteral stent placed and presents today for CT guided biopsy.  He presents this morning in his usual state of health and has no new complaints.  He does complain of chronic cervical spine pain but that is his baseline.   Past Medical History:  Diagnosis Date  . Anxiety   . Collagen vascular disease ([place])   . Depression   . Diabetes mellitus without complication ([place])   . DVT (deep venous thrombosis) ([place])   . History of kidney stones   . Hypertension   . Sleep apnea     Past Surgical History:  Procedure Laterality Date  . CERVICAL FUSION    . CYSTOSCOPY W/ RETROGRADES Left 04/26/2020   Procedure: CYSTOSCOPY WITH RETROGRADE PYELOGRAM;  Surgeon: Hollice Espy, MD;  Location: [facility] ORS;  Service: Urology;  Laterality: Left;  . CYSTOSCOPY WITH URETEROSCOPY AND STENT PLACEMENT Right 07/02/2020   Procedure: CYSTOSCOPY WITH URETEROSCOPY AND STENT PLACEMENT;  Surgeon: Hollice Espy, MD;  Location: [facility] ORS;  Service: Urology;  Laterality: Right;  . CYSTOSCOPY/URETEROSCOPY/HOLMIUM LASER/STENT PLACEMENT Right 04/26/2020   Procedure: CYSTOSCOPY/URETEROSCOPY/HOLMIUM LASER/STENT PLACEMENT;  Surgeon: Hollice Espy, MD;  Location: [facility] ORS;  Service: Urology;  Laterality: Right;  . PLANTAR FASCIA SURGERY Bilateral   . ROTATOR CUFF REPAIR Left   . THUMB FUSION    . URETERAL BIOPSY Right 04/26/2020   Procedure: URETERAL BIOPSY with ablation;  Surgeon: Hollice Espy, MD;  Location: [facility] ORS;  Service: Urology;  Laterality: Right;  . URETEROSCOPY WITH HOLMIUM LASER LITHOTRIPSY      Allergies: Losartan potassium-hctz and Morphine  and related  Medications: Prior to Admission medications   Medication Sig Start Date End Date Taking? Authorizing Provider  ALPRAZolam Duanne Moron) 0.5 MG tablet Take 0.5 mg by mouth in the morning and at bedtime.  01/29/20  Yes [provider]  buPROPion (WELLBUTRIN XL) 150 MG 24 hr tablet Take 300 mg by mouth daily.  01/29/20  Yes [provider]  Cyanocobalamin (VITAMIN B-12 PO) Take 1 capsule by mouth daily. Pure Encapsulations B12 Folate Capsules   Yes [provider]  docusate sodium (COLACE) 100 MG capsule Take 200 mg by mouth daily.   Yes [provider]  ergocalciferol (VITAMIN D2) 1.25 MG (50000 UT) capsule Take 50,000 Units by mouth every Monday.    Yes [provider]  escitalopram (LEXAPRO) 20 MG tablet Take 20 mg by mouth daily.  01/29/20  Yes [provider]  etodolac (LODINE) 400 MG tablet Take 400 mg by mouth daily with breakfast. 06/07/20  Yes [provider]  glipiZIDE (GLUCOTROL XL) 10 MG 24 hr tablet Take 10 mg by mouth daily with breakfast. 06/07/20  Yes [provider]  loratadine (CLARITIN) 10 MG tablet Take 10 mg by mouth daily.    Yes [provider]  Magnesium 500 MG TABS Take 1,000 mg by mouth daily.   Yes [provider]  tamsulosin (FLOMAX) 0.4 MG CAPS capsule Take 1 capsule (0.4 mg total) by mouth daily. 07/02/20  Yes Hollice Espy, MD  telmisartan (MICARDIS) 80 MG tablet Take 40 mg by mouth every evening.  01/29/20  Yes [provider]  warfarin (  COUMADIN) 5 MG tablet Take 5 mg by mouth every evening. Take 1.5 tablets (7.5 mg) by mouth on Saturdays in the evening & take 1 tablet (5 mg) by mouth on Sundays, Mondays, Tuesdays, Wednesdays, Thursdays & Fridays. 03/23/19  Yes [provider]  diclofenac Sodium (VOLTAREN) 1 % GEL Apply 1 g topically 4 (four) times daily as needed (thumb as needed for pain.). Patient not taking: Reported on 07/04/2020    [provider]  enoxaparin (LOVENOX) 40 MG/0.4ML injection Inject 40 mg into the skin daily.    [provider]  meclizine (ANTIVERT) 25 MG tablet Take 25 mg by mouth 2 (two) times daily as needed for dizziness. Patient not taking: Reported on 07/02/2020 06/07/20   [provider]  oxybutynin (DITROPAN) 5 MG tablet Take 1 tablet (5 mg total) by mouth every 8 (eight) hours as needed for bladder spasms. 07/02/20   Hollice Espy, MD  oxyCODONE-acetaminophen (PERCOCET) 5-325 MG tablet Take 1-2 tablets by mouth every 4 (four) hours as needed for moderate pain or severe pain. 07/02/20   Hollice Espy, MD     History reviewed. No pertinent family history.  Social History   Socioeconomic History  . Marital status: Single    Spouse name: Not on file  . Number of children: 0  . Years of education: Not on file  . 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 Smoking status: Former Smoker    Types: Cigarettes    Quit date: 1981    Years since quitting: 40.9  . Smokeless tobacco: Never Used  Vaping Use  . Vaping Use: Never used  Substance and Sexual Activity  . Alcohol use: Never  . Drug use: Never  . Sexual activity: Not on file  Other Topics Concern  . Not on file  Social History Narrative   Lives by himself..   Social Determinants of Health   Financial Resource Strain:   . Difficulty of Paying Living Expenses: Not on file  Food Insecurity:   . Worried About Charity fundraiser in the Last Year: Not on file  . Ran Out of Food in the Last Year: Not on file  Transportation Needs:   . Lack of Transportation (Medical): Not on file  . Lack of Transportation (Non-Medical): Not on file  Physical Activity:   . Days of Exercise per Week: Not on file  . Minutes of Exercise per Session: Not on file  Stress:   . Feeling of Stress : Not on file  Social Connections:   . Frequency of  Communication with Friends and Family: Not on file  . Frequency of Social Gatherings with Friends and Family: Not on file  . Attends Religious Services: Not on file  . Active Member of Clubs or Organizations: Not on file  . Attends Archivist Meetings: Not on file  . Marital Status: Not on file    ECOG Status: 0 - Asymptomatic  Review of Systems: A 12 point ROS discussed and pertinent positives are indicated in the HPI above.  All other systems are negative.  Review of Systems  Vital Signs: BP 110/76   Pulse 68   Temp 98.3 F (36.8 C) (Oral)   Resp 20   Ht 6\' 2"  (1.88 m)   Wt 104.3 kg   SpO2 94%   BMI 29.53 kg/m   Physical Exam Constitutional:  Appearance: Normal appearance.  HENT:     Head: Normocephalic and atraumatic.     Mouth/Throat:     Mouth: Mucous membranes are moist.  Eyes:     General: No scleral icterus. Cardiovascular:     Rate and Rhythm: Normal rate and regular rhythm.     Pulses: Normal pulses.     Heart sounds: Normal heart sounds.  Pulmonary:     Effort: Pulmonary effort is normal.     Breath sounds: Normal breath sounds.  Skin:    General: Skin is warm and dry.  Neurological:     Mental Status: He is alert and oriented to person, place, and time.  Psychiatric:        Mood and Affect: Mood normal.        Behavior: Behavior normal.     Imaging: DG OR UROLOGY CYSTO IMAGE ([facility] ONLY)  Result Date: 07/02/2020 There is no interpretation for this exam.  This order is for images obtained during a surgical procedure.  Please See "Surgeries" Tab for more information regarding the procedure.   CT HEMATURIA WORKUP  Result Date: 06/21/2020 CLINICAL DATA:  Right ureteral mass. EXAM: CT ABDOMEN AND PELVIS WITHOUT AND WITH CONTRAST TECHNIQUE: Multidetector CT imaging of the abdomen and pelvis was performed following the standard protocol before and following the bolus administration of intravenous contrast. CONTRAST:  169mL OMNIPAQUE  IOHEXOL 350 MG/ML SOLN COMPARISON:  04/10/2020 FINDINGS: Lower chest: No acute abnormality. Hepatobiliary: No focal liver abnormality is seen. No gallstones, gallbladder wall thickening, or biliary dilatation. Pancreas: Unremarkable. No pancreatic ductal dilatation or surrounding inflammatory changes. Spleen: Normal in size without focal abnormality. Adrenals/Urinary Tract: Normal appearance of the adrenal glands. Bilateral kidney cysts. The largest arises from the posterolateral cortex of the right inferior pole measuring 5 cm, image 46/9. No nephrolithiasis identified bilaterally. Enhancing soft tissue lesion involving the proximal right ureter measures 2.3 x 2.3 by 3.5 cm, image 55/17 and image 63/2. Additional, mild circumferential soft tissue thickening of the proximal ureter extends for approximately 2.6 cm, image 64/20. Previously this lesion measured 1.5 x 1.1 by 2.9 cm. No additional urinary tract lesions identified. Bladder unremarkable.1 Stomach/Bowel: Stomach is unremarkable. The appendix is visualized and appears normal. No bowel wall thickening, inflammation, or distension. Vascular/Lymphatic: Aortic atherosclerosis. No aneurysm. No retroperitoneal adenopathy identified. Left external iliac lymph node measures 1.2 cm short axis, image 77/9. Unchanged from previous exam. No inguinal adenopathy. Reproductive: Prostate is unremarkable. Other: Fat containing left inguinal hernia. Small fat containing periumbilical hernia is noted measuring 2.8 cm. No free fluid or fluid collections. Musculoskeletal: No acute or significant osseous findings. Degenerative disc disease noted at L5-S1. IMPRESSION: 1. Interval increase in size of enhancing soft tissue mass involving the proximal right ureter compatible with urothelial neoplasm. 2. Unchanged mildly enlarged left external iliac lymph node. No retroperitoneal adenopathy noted. 3. Bilateral kidney cysts. 4. Fat containing left inguinal and periumbilical hernias. 5.  Aortic atherosclerosis. Aortic Atherosclerosis (ICD10-I70.0). Electronically Signed   By: Kerby Moors M.D.   On: 06/21/2020 10:24    Labs:  CBC: No results for input(s): WBC, HGB, HCT, PLT in the last 8760 hours.  COAGS: Recent Labs    07/26/19 0000 07/02/20 1202  INR 1.0* 1.0    BMP: No results for input(s): NA, K, CL, CO2, GLUCOSE, BUN, CALCIUM, CREATININE, GFRNONAA, GFRAA in the last 8760 hours.  Invalid input(s): CMP  LIVER FUNCTION TESTS: No results for input(s): BILITOT, AST, ALT, ALKPHOS, PROT, ALBUMIN in the last 8760  hours.  TUMOR MARKERS: No results for input(s): AFPTM, CEA, CA199, CHROMGRNA in the last 8760 hours.  Assessment and Plan:  Right ureteral mass, exophytic and not visible by ureteroscopy.    1.) Proceed with CT guided core biopsy as planned.   Risks and benefits of right ureteral mass was discussed with the patient and/or patient's family including, but not limited to bleeding, infection, damage to adjacent structures or low yield requiring additional tests.  All of the questions were answered and there is agreement to proceed.  Consent signed and in chart.    Thank you for this interesting consult.  I greatly enjoyed meeting Eddie Ryan and look forward to participating in their care.  A copy of this report was sent to the requesting provider on this date.  Electronically Signed: Criselda Peaches, MD 07/04/2020, 10:23 AM   I spent a total of 15 Minutes  in face to face in clinical consultation, greater than 50% of which was counseling/coordinating care for right ureteral mass.

## 2020-07-08 ENCOUNTER — Encounter: Payer: Self-pay | Admitting: Emergency Medicine

## 2020-07-08 ENCOUNTER — Emergency Department
Admission: EM | Admit: 2020-07-08 | Discharge: 2020-07-08 | Disposition: A | Discharge status: Home / Self Care | Payer: Commercial Managed Care - PPO | Attending: Emergency Medicine | Admitting: Emergency Medicine

## 2020-07-08 ENCOUNTER — Emergency Department: Payer: Commercial Managed Care - PPO

## 2020-07-08 ENCOUNTER — Other Ambulatory Visit: Payer: Self-pay

## 2020-07-08 DIAGNOSIS — Z7901 Long term (current) use of anticoagulants: Secondary | ICD-10-CM | POA: Insufficient documentation

## 2020-07-08 DIAGNOSIS — Z87891 Personal history of nicotine dependence: Secondary | ICD-10-CM | POA: Diagnosis not present

## 2020-07-08 DIAGNOSIS — L600 Ingrowing nail: Secondary | ICD-10-CM | POA: Diagnosis not present

## 2020-07-08 DIAGNOSIS — Z86718 Personal history of other venous thrombosis and embolism: Secondary | ICD-10-CM | POA: Insufficient documentation

## 2020-07-08 DIAGNOSIS — E119 Type 2 diabetes mellitus without complications: Secondary | ICD-10-CM | POA: Insufficient documentation

## 2020-07-08 DIAGNOSIS — Z7984 Long term (current) use of oral hypoglycemic drugs: Secondary | ICD-10-CM | POA: Diagnosis not present

## 2020-07-08 DIAGNOSIS — M79604 Pain in right leg: Secondary | ICD-10-CM | POA: Insufficient documentation

## 2020-07-08 DIAGNOSIS — I10 Essential (primary) hypertension: Secondary | ICD-10-CM | POA: Insufficient documentation

## 2020-07-08 DIAGNOSIS — Z79899 Other long term (current) drug therapy: Secondary | ICD-10-CM | POA: Insufficient documentation

## 2020-07-08 LAB — CBC
HCT: 43.3 % (ref 39.0–52.0)
Hemoglobin: 14.2 g/dL (ref 13.0–17.0)
MCH: 28.9 pg (ref 26.0–34.0)
MCHC: 32.8 g/dL (ref 30.0–36.0)
MCV: 88 fL (ref 80.0–100.0)
Platelets: 123 10*3/uL — ABNORMAL LOW (ref 150–400)
RBC: 4.92 MIL/uL (ref 4.22–5.81)
RDW: 14 % (ref 11.5–15.5)
WBC: 4.7 10*3/uL (ref 4.0–10.5)
nRBC: 0 % (ref 0.0–0.2)

## 2020-07-08 LAB — BASIC METABOLIC PANEL
Anion gap: 9 (ref 5–15)
BUN: 21 mg/dL (ref 8–23)
CO2: 23 mmol/L (ref 22–32)
Calcium: 9.2 mg/dL (ref 8.9–10.3)
Chloride: 102 mmol/L (ref 98–111)
Creatinine, Ser: 1.65 mg/dL — ABNORMAL HIGH (ref 0.61–1.24)
GFR, Estimated: 46 mL/min — ABNORMAL LOW (ref >60)
Glucose, Bld: 280 mg/dL — ABNORMAL HIGH (ref 70–99)
Potassium: 4.4 mmol/L (ref 3.5–5.1)
Sodium: 134 mmol/L — ABNORMAL LOW (ref 135–145)

## 2020-07-08 LAB — PROTIME-INR
INR: 1.1 (ref 0.8–1.2)
Prothrombin Time: 13.6 seconds (ref 11.4–15.2)

## 2020-07-08 NOTE — ED Triage Notes (Signed)
Pt reports was on coumadin and then transitioned to Lovenox for his procedure and took his last injection of that on Friday. Pt reports pain is only in his right foot great toe.

## 2020-07-08 NOTE — ED Triage Notes (Signed)
Pt reports had 2 procedures done this past week and now has tinglings in the back of his leg and is concerned about a DVT. Pt reports hx of DVTS and called his MD and was advised to come to the ED.

## 2020-07-08 NOTE — ED Provider Notes (Signed)
[facility] Emergency Department Provider Note   ____________________________________________   First MD Initiated Contact with Patient 07/08/20 1017     (approximate)  I have reviewed the triage vital signs and the nursing notes.   HISTORY  Chief Complaint Leg Pain    HPI Eddie Ryan is a 64 y.o. male with past medical history of hypertension, diabetes, and DVT on Coumadin who presents to the ED complaining of leg pain.  Patient reports that he had 2 separate procedures performed 5 and 6 days ago, for which he needed to be bridged to Lovenox from his usual Coumadin.  2 days ago, he noticed some pain to his right calf which seemed to move down his leg.  He has also had significant pain at the distal portion of his right toe.  At one point he thought he might have an ingrown toenail and attempted to treat this himself at home by removing part of his toenail a couple of days ago.  Due to the pain in his leg, he was concerned about recurrent DVT, spoke with his PCPs office who recommended he come to the ED for further evaluation.  Patient denies any chest pain or shortness of breath.        Past Medical History:  Diagnosis Date  . Anxiety   . Collagen vascular disease ([place])   . Depression   . Diabetes mellitus without complication ([place])   . DVT (deep venous thrombosis) ([place])   . History of kidney stones   . Hypertension   . Sleep apnea     There are no problems to display for this patient.   Past Surgical History:  Procedure Laterality Date  . CERVICAL FUSION    . CYSTOSCOPY W/ RETROGRADES Left 04/26/2020   Procedure: CYSTOSCOPY WITH RETROGRADE PYELOGRAM;  Surgeon: Hollice Espy, MD;  Location: [facility] ORS;  Service: Urology;  Laterality: Left;  . CYSTOSCOPY WITH URETEROSCOPY AND STENT PLACEMENT Right 07/02/2020   Procedure: CYSTOSCOPY WITH URETEROSCOPY AND STENT PLACEMENT;  Surgeon: Hollice Espy, MD;  Location: [facility] ORS;  Service: Urology;   Laterality: Right;  . CYSTOSCOPY/URETEROSCOPY/HOLMIUM LASER/STENT PLACEMENT Right 04/26/2020   Procedure: CYSTOSCOPY/URETEROSCOPY/HOLMIUM LASER/STENT PLACEMENT;  Surgeon: Hollice Espy, MD;  Location: [facility] ORS;  Service: Urology;  Laterality: Right;  . PLANTAR FASCIA SURGERY Bilateral   . ROTATOR CUFF REPAIR Left   . THUMB FUSION    . URETERAL BIOPSY Right 04/26/2020   Procedure: URETERAL BIOPSY with ablation;  Surgeon: Hollice Espy, MD;  Location: [facility] ORS;  Service: Urology;  Laterality: Right;  . URETEROSCOPY WITH HOLMIUM LASER LITHOTRIPSY      Prior to Admission medications   Medication Sig Start Date End Date Taking? Authorizing Provider  ALPRAZolam Duanne Moron) 0.5 MG tablet Take 0.5 mg by mouth in the morning and at bedtime.  01/29/20   [provider]  buPROPion (WELLBUTRIN XL) 150 MG 24 hr tablet Take 300 mg by mouth daily.  01/29/20   [provider]  Cyanocobalamin (VITAMIN B-12 PO) Take 1 capsule by mouth daily. Pure Encapsulations B12 Folate Capsules    [provider]  diclofenac Sodium (VOLTAREN) 1 % GEL Apply 1 g topically 4 (four) times daily as needed (thumb as needed for pain.). Patient not taking: Reported on 07/04/2020    [provider]  docusate sodium (COLACE) 100 MG capsule Take 200 mg by mouth daily.    [provider]  enoxaparin (LOVENOX) 40 MG/0.4ML injection Inject 40 mg into the skin daily.  [provider]  ergocalciferol (VITAMIN D2) 1.25 MG (50000 UT) capsule Take 50,000 Units by mouth every Monday.     [provider]  escitalopram (LEXAPRO) 20 MG tablet Take 20 mg by mouth daily.  01/29/20   [provider]  etodolac (LODINE) 400 MG tablet Take 400 mg by mouth daily with breakfast. 06/07/20   [provider]  glipiZIDE (GLUCOTROL XL) 10 MG 24 hr tablet Take 10 mg by mouth daily with breakfast. 06/07/20   [provider]  loratadine (CLARITIN) 10 MG tablet Take 10 mg by  mouth daily.     [provider]  Magnesium 500 MG TABS Take 1,000 mg by mouth daily.    [provider]  meclizine (ANTIVERT) 25 MG tablet Take 25 mg by mouth 2 (two) times daily as needed for dizziness. Patient not taking: Reported on 07/02/2020 06/07/20   [provider]  oxybutynin (DITROPAN) 5 MG tablet Take 1 tablet (5 mg total) by mouth every 8 (eight) hours as needed for bladder spasms. 07/02/20   Hollice Espy, MD  oxyCODONE-acetaminophen (PERCOCET) 5-325 MG tablet Take 1-2 tablets by mouth every 4 (four) hours as needed for moderate pain or severe pain. 07/02/20   Hollice Espy, MD  tamsulosin (FLOMAX) 0.4 MG CAPS capsule Take 1 capsule (0.4 mg total) by mouth daily. 07/02/20   Hollice Espy, MD  telmisartan (MICARDIS) 80 MG tablet Take 40 mg by mouth every evening.  01/29/20   [provider]  warfarin (COUMADIN) 5 MG tablet Take 5 mg by mouth every evening. Take 1.5 tablets (7.5 mg) by mouth on Saturdays in the evening & take 1 tablet (5 mg) by mouth on Sundays, Mondays, Tuesdays, Wednesdays, Thursdays & Fridays. 03/23/19   [provider]    Allergies Losartan potassium-hctz and Morphine and related  No family history on file.  Social History Social History   Tobacco Use  . Smoking status: Former Smoker    Types: Cigarettes    Quit date: 1981    Years since quitting: 40.9  . Smokeless tobacco: Never Used  Vaping Use  . Vaping Use: Never used  Substance Use Topics  . Alcohol use: Never  . Drug use: Never    Review of Systems  Constitutional: No fever/chills Eyes: No visual changes. ENT: No sore throat. Cardiovascular: Denies chest pain. Respiratory: Denies shortness of breath. Gastrointestinal: No abdominal pain.  No nausea, no vomiting.  No diarrhea.  No constipation. Genitourinary: Negative for dysuria. Musculoskeletal: Negative for back pain.  Positive for right leg pain. Skin: Negative for rash. Neurological:  Negative for headaches, focal weakness or numbness.  ____________________________________________   PHYSICAL EXAM:  VITAL SIGNS: ED Triage Vitals  Enc Vitals Group     BP 07/08/20 0828 119/69     Pulse Rate 07/08/20 0828 99     Resp 07/08/20 0828 20     Temp 07/08/20 0828 98.5 F (36.9 C)     Temp Source 07/08/20 0828 Oral     SpO2 07/08/20 0828 96 %     Weight 07/08/20 0825 230 lb (104.3 kg)     Height 07/08/20 0825 6\' 2"  (1.88 m)     Head Circumference --      Peak Flow --      Pain Score 07/08/20 0825 5     Pain Loc --      Pain Edu? --      Excl. in [place]? --     Constitutional: Alert and oriented.  Eyes: Conjunctivae are normal. Head: Atraumatic. Nose: No congestion/rhinnorhea. Mouth/Throat: Mucous membranes are moist. Neck: Normal ROM Cardiovascular: Normal rate, regular rhythm. Grossly normal heart sounds.  2+ DP pulse on right. Respiratory: Normal respiratory effort.  No retractions. Lungs CTAB. Gastrointestinal: Soft and nontender. No distention. Genitourinary: deferred Musculoskeletal: No lower extremity tenderness nor edema.  No calf tenderness noted bilaterally.  Tenderness to palpation at the distal right great toe, no tenderness at the base of the great toe.  No edema, erythema or warmth.  Cap refill less than 2 seconds in all toes on right. Neurologic:  Normal speech and language. No gross focal neurologic deficits are appreciated. Skin:  Skin is warm, dry and intact. No rash noted. Psychiatric: Mood and affect are normal. Speech and behavior are normal.  ____________________________________________   LABS (all labs ordered are listed, but only abnormal results are displayed)  Labs Reviewed  CBC - Abnormal; Notable for the following components:      Result Value   Platelets 123 (*)    All other components within normal limits  BASIC METABOLIC PANEL - Abnormal; Notable for the following components:   Sodium 134 (*)    Glucose, Bld 280 (*)     Creatinine, Ser 1.65 (*)    GFR, Estimated 46 (*)    All other components within normal limits  PROTIME-INR    PROCEDURES  Procedure(s) performed (including Critical Care):  Procedures   ____________________________________________   INITIAL IMPRESSION / ASSESSMENT AND PLAN / ED COURSE       64 year old male with past medical history of hypertension, diabetes, DVT on Coumadin who presents to the ED for pain in his right calf as well as his distal right toe.  No swelling or calf tenderness noted and patient is neurovascularly intact to his distal right lower extremity.  Ultrasound was negative for DVT, labs show chronic kidney disease that is similar to previous.  Much of his pain is centered around the distal portion of his right great toe, where he appears to have pain around his partially treated ingrown toenail.  No evidence of cellulitis or tenderness at the base of the toes to suggest gout.  He is appropriate for discharge home with podiatry follow-up, patient counseled to follow-up with his PCP and return to the ED for new worsening symptoms.  Patient agrees with plan.      ____________________________________________   FINAL CLINICAL IMPRESSION(S) / ED DIAGNOSES  Final diagnoses:  Right leg pain  Ingrown toenail     ED Discharge Orders    None       Note:  This document was prepared using Dragon voice recognition software and may include unintentional dictation errors.   Blake Divine, MD 07/08/20 1041

## 2020-07-08 NOTE — ED Notes (Signed)
Pt unable to sign for discharge d/t no signature pad °

## 2020-07-09 ENCOUNTER — Encounter: Payer: Self-pay | Admitting: Urology

## 2020-07-11 DIAGNOSIS — C8590 Non-Hodgkin lymphoma, unspecified, unspecified site: Secondary | ICD-10-CM

## 2020-07-11 HISTORY — DX: Non-Hodgkin lymphoma, unspecified, unspecified site: C85.90

## 2020-07-13 ENCOUNTER — Encounter: Payer: Self-pay | Admitting: Urology

## 2020-07-16 ENCOUNTER — Telehealth: Payer: Self-pay | Admitting: Urology

## 2020-07-16 DIAGNOSIS — C969 Malignant neoplasm of lymphoid, hematopoietic and related tissue, unspecified: Secondary | ICD-10-CM

## 2020-07-16 NOTE — Telephone Encounter (Signed)
Called patient Friday 12/3 to discuss pathology.  Appears that the periureteral mass is in fact likely lymphoma.  Referral to medical oncology and PET scan order placed.  Follow-up for stent removal this week as scheduled.  Hollice Espy, MD

## 2020-07-17 ENCOUNTER — Other Ambulatory Visit: Payer: Self-pay

## 2020-07-17 ENCOUNTER — Encounter: Payer: Self-pay | Admitting: Urology

## 2020-07-17 ENCOUNTER — Ambulatory Visit (INDEPENDENT_AMBULATORY_CARE_PROVIDER_SITE_OTHER): Payer: Commercial Managed Care - PPO | Admitting: Urology

## 2020-07-17 VITALS — BP 136/78 | HR 75

## 2020-07-17 DIAGNOSIS — N200 Calculus of kidney: Secondary | ICD-10-CM | POA: Diagnosis not present

## 2020-07-17 MED ORDER — CIPROFLOXACIN HCL 500 MG PO TABS
500.0000 mg | ORAL_TABLET | Freq: Once | ORAL | Status: AC
  Administered 2020-07-17: 500 mg via ORAL

## 2020-07-17 NOTE — Progress Notes (Signed)
   07/17/20  CC:  Chief Complaint  Patient presents with  . Cysto Stent Removal    HPI: 64 year old male with right proximal periureteral mass now status post percutaneous biopsy revealing diffuse B-cell lymphoma.  He presents today for stent removal which is pleased following diagnostic ureteroscopy for the purpose of ureteral identification for biopsy.  PET scan and oncology evaluation pending.  UA today is mildly suspicious although he is otherwise asymptomatic, afebrile.  Cipro given today.  Today's Vitals   07/17/20 1338  BP: 136/78  Pulse: 75   NED. A&Ox3.   No respiratory distress   Abd soft, NT, ND Normal phallus with bilateral descended testicles  Cystoscopy/ Stent removal procedure  Patient identification was confirmed, informed consent was obtained, and patient was prepped using Betadine solution.  Lidocaine jelly was administered per urethral meatus.    Preoperative abx where received prior to procedure.    Procedure: - Flexible cystoscope introduced, without any difficulty.   - Thorough search of the bladder revealed:    normal urethral meatus  Stent seen emanating from right ureteral orifice, grasped with stent graspers, and removed in entirety.     Post-Procedure: - Patient tolerated the procedure well   Assessment/ Plan:  1.  B-cell lymphoma Newly diagnosed, PET scan pending Follow-up with Dr. Janese Banks later this week as scheduled Stent was removed today as this mass although periureteral is not causing significant hydronephrosis or obstruction at this time, remove for comfort   Follow-up in 1 year for surveillance of stones   Hollice Espy, MD

## 2020-07-18 ENCOUNTER — Other Ambulatory Visit: Payer: Self-pay | Admitting: *Deleted

## 2020-07-18 DIAGNOSIS — C8590 Non-Hodgkin lymphoma, unspecified, unspecified site: Secondary | ICD-10-CM

## 2020-07-18 LAB — URINALYSIS, COMPLETE
Bilirubin, UA: NEGATIVE
Glucose, UA: NEGATIVE
Nitrite, UA: POSITIVE — AB
Specific Gravity, UA: >1.03 — ABNORMAL HIGH (ref 1.005–1.030)
Urobilinogen, Ur: 1 mg/dL (ref 0.2–1.0)
pH, UA: 5 (ref 5.0–7.5)

## 2020-07-18 LAB — MICROSCOPIC EXAMINATION: RBC, Urine: >30 /hpf — AB (ref 0–2)

## 2020-07-19 ENCOUNTER — Other Ambulatory Visit: Payer: Self-pay

## 2020-07-19 ENCOUNTER — Inpatient Hospital Stay: Payer: Commercial Managed Care - PPO

## 2020-07-19 ENCOUNTER — Other Ambulatory Visit: Payer: Self-pay | Admitting: *Deleted

## 2020-07-19 ENCOUNTER — Encounter: Payer: Self-pay | Admitting: Oncology

## 2020-07-19 ENCOUNTER — Inpatient Hospital Stay: Payer: Commercial Managed Care - PPO | Attending: Oncology | Admitting: Oncology

## 2020-07-19 VITALS — HR 76 | Temp 98.1°F | Resp 16 | Ht 74.0 in | Wt 225.3 lb

## 2020-07-19 DIAGNOSIS — Z806 Family history of leukemia: Secondary | ICD-10-CM | POA: Insufficient documentation

## 2020-07-19 DIAGNOSIS — C8580 Other specified types of non-Hodgkin lymphoma, unspecified site: Secondary | ICD-10-CM

## 2020-07-19 DIAGNOSIS — C8516 Unspecified B-cell lymphoma, intrapelvic lymph nodes: Secondary | ICD-10-CM | POA: Insufficient documentation

## 2020-07-19 DIAGNOSIS — Z885 Allergy status to narcotic agent status: Secondary | ICD-10-CM | POA: Diagnosis not present

## 2020-07-19 DIAGNOSIS — Z79899 Other long term (current) drug therapy: Secondary | ICD-10-CM | POA: Insufficient documentation

## 2020-07-19 DIAGNOSIS — M79604 Pain in right leg: Secondary | ICD-10-CM | POA: Insufficient documentation

## 2020-07-19 DIAGNOSIS — Z7901 Long term (current) use of anticoagulants: Secondary | ICD-10-CM | POA: Insufficient documentation

## 2020-07-19 DIAGNOSIS — Z87891 Personal history of nicotine dependence: Secondary | ICD-10-CM | POA: Insufficient documentation

## 2020-07-19 DIAGNOSIS — Z86718 Personal history of other venous thrombosis and embolism: Secondary | ICD-10-CM | POA: Diagnosis not present

## 2020-07-19 DIAGNOSIS — D6862 Lupus anticoagulant syndrome: Secondary | ICD-10-CM | POA: Insufficient documentation

## 2020-07-19 DIAGNOSIS — I700 Atherosclerosis of aorta: Secondary | ICD-10-CM | POA: Diagnosis not present

## 2020-07-19 DIAGNOSIS — Z8042 Family history of malignant neoplasm of prostate: Secondary | ICD-10-CM | POA: Insufficient documentation

## 2020-07-19 DIAGNOSIS — N281 Cyst of kidney, acquired: Secondary | ICD-10-CM | POA: Insufficient documentation

## 2020-07-19 DIAGNOSIS — Z800 Family history of malignant neoplasm of digestive organs: Secondary | ICD-10-CM | POA: Insufficient documentation

## 2020-07-19 DIAGNOSIS — K4090 Unilateral inguinal hernia, without obstruction or gangrene, not specified as recurrent: Secondary | ICD-10-CM | POA: Diagnosis not present

## 2020-07-19 DIAGNOSIS — Z87442 Personal history of urinary calculi: Secondary | ICD-10-CM | POA: Diagnosis not present

## 2020-07-19 DIAGNOSIS — Z7189 Other specified counseling: Secondary | ICD-10-CM

## 2020-07-19 DIAGNOSIS — D6861 Antiphospholipid syndrome: Secondary | ICD-10-CM

## 2020-07-19 LAB — COMPREHENSIVE METABOLIC PANEL
ALT: 47 U/L — ABNORMAL HIGH (ref 0–44)
AST: 37 U/L (ref 15–41)
Albumin: 3.9 g/dL (ref 3.5–5.0)
Alkaline Phosphatase: 48 U/L (ref 38–126)
Anion gap: 11 (ref 5–15)
BUN: 17 mg/dL (ref 8–23)
CO2: 24 mmol/L (ref 22–32)
Calcium: 9.1 mg/dL (ref 8.9–10.3)
Chloride: 102 mmol/L (ref 98–111)
Creatinine, Ser: 1.39 mg/dL — ABNORMAL HIGH (ref 0.61–1.24)
GFR, Estimated: 57 mL/min — ABNORMAL LOW (ref >60)
Glucose, Bld: 188 mg/dL — ABNORMAL HIGH (ref 70–99)
Potassium: 3.9 mmol/L (ref 3.5–5.1)
Sodium: 137 mmol/L (ref 135–145)
Total Bilirubin: 0.6 mg/dL (ref 0.3–1.2)
Total Protein: 6.7 g/dL (ref 6.5–8.1)

## 2020-07-19 LAB — HEPATITIS B CORE ANTIBODY, TOTAL: Hep B Core Total Ab: NONREACTIVE

## 2020-07-19 LAB — URIC ACID: Uric Acid, Serum: 6.9 mg/dL (ref 3.7–8.6)

## 2020-07-19 LAB — CBC WITH DIFFERENTIAL/PLATELET
Abs Immature Granulocytes: 0.01 10*3/uL (ref 0.00–0.07)
Basophils Absolute: 0.1 10*3/uL (ref 0.0–0.1)
Basophils Relative: 1 %
Eosinophils Absolute: 0.2 10*3/uL (ref 0.0–0.5)
Eosinophils Relative: 2 %
HCT: 44.8 % (ref 39.0–52.0)
Hemoglobin: 14.8 g/dL (ref 13.0–17.0)
Immature Granulocytes: 0 %
Lymphocytes Relative: 24 %
Lymphs Abs: 1.7 10*3/uL (ref 0.7–4.0)
MCH: 28.6 pg (ref 26.0–34.0)
MCHC: 33 g/dL (ref 30.0–36.0)
MCV: 86.5 fL (ref 80.0–100.0)
Monocytes Absolute: 0.5 10*3/uL (ref 0.1–1.0)
Monocytes Relative: 7 %
Neutro Abs: 4.6 10*3/uL (ref 1.7–7.7)
Neutrophils Relative %: 66 %
Platelets: 208 10*3/uL (ref 150–400)
RBC: 5.18 MIL/uL (ref 4.22–5.81)
RDW: 13.8 % (ref 11.5–15.5)
WBC: 7 10*3/uL (ref 4.0–10.5)
nRBC: 0 % (ref 0.0–0.2)

## 2020-07-19 LAB — HEPATITIS B SURFACE ANTIGEN: Hepatitis B Surface Ag: NONREACTIVE

## 2020-07-19 LAB — LACTATE DEHYDROGENASE: LDH: 136 U/L (ref 98–192)

## 2020-07-19 LAB — HEPATITIS C ANTIBODY: HCV Ab: NONREACTIVE

## 2020-07-20 ENCOUNTER — Telehealth: Payer: Self-pay | Admitting: *Deleted

## 2020-07-20 ENCOUNTER — Encounter: Payer: Self-pay | Admitting: Oncology

## 2020-07-20 DIAGNOSIS — Z7189 Other specified counseling: Secondary | ICD-10-CM | POA: Insufficient documentation

## 2020-07-20 LAB — HEPATITIS B SURFACE ANTIBODY, QUANTITATIVE: Hep B S AB Quant (Post): <3.1 m[IU]/mL — ABNORMAL LOW (ref >9.9)

## 2020-07-20 LAB — CULTURE, URINE COMPREHENSIVE

## 2020-07-20 NOTE — Progress Notes (Signed)
Hematology/Oncology Consult note [facility] Telephone:([phone] [phone]  Patient Care Team: Rusty Aus, MD as PCP - General (Internal Medicine)   Name of the patient: Eddie Ryan  [id number]  01-24-1956    Reason for referral-new diagnosis of B-cell lymphoma   Referring physician-Dr. Erlene Quan  Date of visit: 07/20/20   History of presenting illness- Patient is a 64 year old male with a prior history of DVT about 15 years ago and was diagnosed with antiphospholipid antibody syndrome with a positive lupus anticoagulant.  He has remained on Coumadin since then.  He has been seeing Dr. Erlene Quan for kidney stones.  He had a CT abdomen and pelvis in August 2021 which showed thickening of the urothelium concerning for a urothelial lesion.  He underwent a cystoscopy and ureteroscopy in September 2021 which did not show any intraluminal pathology.  Plan was to get a repeat CT urogram 6 weeks following that.  He had a CT hematuria work-up again in November 2021 which showed enhancing soft tissue lesion in the proximal right ureter measuring 2.3 x 2.3 x 3.5 cm which was extending for 2.6 cm and enlarges compared to prior size of 1.5 x 1.1 x 2.9 cm.  There is no evidence of retroperitoneal adenopathy.  Left external iliac node was 1.2 cm and was unchanged as compared to prior CT scan.  He underwent a repeat ureteroscopy with stent placement.  There was no obvious filling defect but there was a slight narrowing of ureter and patient therefore underwent a CT-guided biopsy by IR  Biopsy showed B-cell lymphoma High-grade with a Ki-67 of greater than 90%. Comment:  Core biopsy sections display lymphoid tissue. There are aggregates  comprised of larger lymphocytes, interspersed with areas of small mature  lymphocytes, and focal background bands of fibrosis. Typical follicular  architecture is not present.   Immunohistochemical studies show strong and diffuse  positivity for CD20,  indicating a B cell proliferation, comprised of both smaller and larger  lymphocytes. CD3 marks scattered background T cells. The subset of  larger B cells shows positivity for CD10, BCL6, and MUM-1 (partial).  These cells are negative for BCL2, Cyclin-D1, and CD30. BCL2 appears to  show marking in the subset of smaller lymphocytes, as well as normal T  cells. Ki-67 is significantly elevated in the subset of larger B cells,  estimated at greater than 90% staining. C-myc shows dim staining in a  portion of the larger B cells, approximately 20-30%. Pancytokeratin and  CD56 are negative.   Flow cytometric studies detect an abnormal CD10+ B cell population in a  background of polytypic B cells. These CD10+ B cells represent 28% of B  cells and show very dim kappa light chain restriction, to negative light  chain expression. By light scatter, these B cells appear approximately  the same size as background T cells. There is no loss of, or aberrant  expression of, pan T-cell antigens to suggest a neoplastic T cell  process. Cell viability in this sample is sufficient for analysis, but  less than optimal.   Overall classification of this process is limited due to the small size  of core biopsy samples. The histologic and immunophenotypic findings are  compatible with involvement by a B cell lymphoma. CD10 expression, as  seen by immunohistochemistry and flow cytometry, may imply  germinal/follicular center origin of these B cells. Absence of staining  for BCL2 could suggest against follicular lymphoma, although BCL2  expression may be absent in  25-50% of cases of grade 3 follicular  lymphoma. The differential diagnosis may also include diffuse large B  cell lymphoma, given the increased proportion of larger abnormal B  cells. Further subclassification of this process would be dependent upon  excisional biopsy or review of overall lesional architecture. There is   sufficient tissue for ancillary FISH or NGS testing, if desired.   Patient currently reports no hematuria since stent removal.  Appetite and weight is stable.  Denies any drenching night sweats.   ECOG PS- 0  Pain scale- 0   Review of systems- Review of Systems  Constitutional: Negative for chills, fever, malaise/fatigue and weight loss.  HENT: Negative for congestion, ear discharge and nosebleeds.   Eyes: Negative for blurred vision.  Respiratory: Negative for cough, hemoptysis, sputum production, shortness of breath and wheezing.   Cardiovascular: Negative for chest pain, palpitations, orthopnea and claudication.  Gastrointestinal: Negative for abdominal pain, blood in stool, constipation, diarrhea, heartburn, melena, nausea and vomiting.  Genitourinary: Negative for dysuria, flank pain, frequency, hematuria and urgency.  Musculoskeletal: Negative for back pain, joint pain and myalgias.  Skin: Negative for rash.  Neurological: Negative for dizziness, tingling, focal weakness, seizures, weakness and headaches.  Endo/Heme/Allergies: Does not bruise/bleed easily.  Psychiatric/Behavioral: Negative for depression and suicidal ideas. The patient does not have insomnia.     Allergies  Allergen Reactions  . Losartan Potassium-Hctz Other (See Comments)    Unknown reaction.  . Morphine And Related Itching    WITH PROLONGED USE    There are no problems to display for this patient.    Past Medical History:  Diagnosis Date  . Anxiety   . Collagen vascular disease ([place])   . Depression   . Diabetes mellitus without complication ([place])   . DVT (deep venous thrombosis) ([place])   . History of kidney stones   . Hypertension   . Lymphoma ([place]) 07/2020  . Sleep apnea      Past Surgical History:  Procedure Laterality Date  . CERVICAL FUSION    . CYSTOSCOPY W/ RETROGRADES Left 04/26/2020   Procedure: CYSTOSCOPY WITH RETROGRADE PYELOGRAM;  Surgeon: Hollice Espy, MD;  Location: [facility]  ORS;  Service: Urology;  Laterality: Left;  . CYSTOSCOPY WITH URETEROSCOPY AND STENT PLACEMENT Right 07/02/2020   Procedure: CYSTOSCOPY WITH URETEROSCOPY AND STENT PLACEMENT;  Surgeon: Hollice Espy, MD;  Location: [facility] ORS;  Service: Urology;  Laterality: Right;  . CYSTOSCOPY/URETEROSCOPY/HOLMIUM LASER/STENT PLACEMENT Right 04/26/2020   Procedure: CYSTOSCOPY/URETEROSCOPY/HOLMIUM LASER/STENT PLACEMENT;  Surgeon: Hollice Espy, MD;  Location: [facility] ORS;  Service: Urology;  Laterality: Right;  . PLANTAR FASCIA SURGERY Bilateral   . ROTATOR CUFF REPAIR Left   . THUMB FUSION    . URETERAL BIOPSY Right 04/26/2020   Procedure: URETERAL BIOPSY with ablation;  Surgeon: Hollice Espy, MD;  Location: [facility] ORS;  Service: Urology;  Laterality: Right;  . URETEROSCOPY WITH HOLMIUM LASER LITHOTRIPSY      Social History   Socioeconomic History  . Marital status: Single    Spouse name: Not on file  . Number of children: 0  . Years of education: Not on file  . 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 Smoking status: Former Smoker    Types: Cigarettes    Quit date: 1981    Years since quitting: 40.9  . Smokeless tobacco: Never Used  Vaping Use  . Vaping Use: Never used  Substance and Sexual  Activity  . Alcohol use: Never  . Drug use: Never  . Sexual activity: Not Currently  Other Topics Concern  . Not on file  Social History Narrative   Lives by himself..   Social Determinants of Health   Financial Resource Strain: Not on file  Food Insecurity: Not on file  Transportation Needs: Not on file  Physical Activity: Not on file  Stress: Not on file  Social Connections: Not on file  Intimate Partner Violence: Not on file     Family History  Problem Relation Age of Onset  . Pancreatic cancer Father   . Prostate cancer Paternal Grandmother   . Leukemia Nephew      Current Outpatient  Medications:  .  ALPRAZolam (XANAX) 0.5 MG tablet, Take 0.5 mg by mouth in the morning and at bedtime. , Disp: , Rfl:  .  buPROPion (WELLBUTRIN XL) 150 MG 24 hr tablet, Take 300 mg by mouth daily. , Disp: , Rfl:  .  Cyanocobalamin (VITAMIN B-12 PO), Take 1 capsule by mouth daily. Pure Encapsulations B12 Folate Capsules, Disp: , Rfl:  .  diclofenac Sodium (VOLTAREN) 1 % GEL, Apply 1 g topically 4 (four) times daily as needed (thumb as needed for pain.). , Disp: , Rfl:  .  docusate sodium (COLACE) 100 MG capsule, Take 200 mg by mouth daily., Disp: , Rfl:  .  ergocalciferol (VITAMIN D2) 1.25 MG (50000 UT) capsule, Take 50,000 Units by mouth every Monday. , Disp: , Rfl:  .  escitalopram (LEXAPRO) 20 MG tablet, Take 20 mg by mouth daily. , Disp: , Rfl:  .  etodolac (LODINE) 400 MG tablet, Take 400 mg by mouth daily with breakfast., Disp: , Rfl:  .  glipiZIDE (GLUCOTROL XL) 10 MG 24 hr tablet, Take 10 mg by mouth daily with breakfast., Disp: , Rfl:  .  loratadine (CLARITIN) 10 MG tablet, Take 10 mg by mouth daily. , Disp: , Rfl:  .  Magnesium 500 MG TABS, Take 1,000 mg by mouth daily., Disp: , Rfl:  .  meclizine (ANTIVERT) 25 MG tablet, Take 25 mg by mouth 2 (two) times daily as needed for dizziness. , Disp: , Rfl:  .  oxyCODONE-acetaminophen (PERCOCET) 5-325 MG tablet, Take 1-2 tablets by mouth every 4 (four) hours as needed for moderate pain or severe pain., Disp: 20 tablet, Rfl: 0 .  telmisartan (MICARDIS) 80 MG tablet, Take 40 mg by mouth every evening. , Disp: , Rfl:  .  warfarin (COUMADIN) 5 MG tablet, Take 5 mg by mouth every evening. Take 1.5 tablets (7.5 mg) by mouth on Saturdays in the evening & take 1 tablet (5 mg) by mouth on Sundays, Mondays, Tuesdays, Wednesdays, Thursdays & Fridays., Disp: , Rfl:  .  enoxaparin (LOVENOX) 40 MG/0.4ML injection, Inject 40 mg into the skin daily., Disp: , Rfl:  .  oxybutynin (DITROPAN) 5 MG tablet, Take 1 tablet (5 mg total) by mouth every 8 (eight) hours as  needed for bladder spasms. (Patient not taking: Reported on 07/19/2020), Disp: 30 tablet, Rfl: 0 .  tamsulosin (FLOMAX) 0.4 MG CAPS capsule, Take 1 capsule (0.4 mg total) by mouth daily. (Patient not taking: Reported on 07/19/2020), Disp: 30 capsule, Rfl: 0   Physical exam:  Vitals:   07/19/20 0854  Pulse: 76  Resp: 16  Temp: 98.1 F (36.7 C)  TempSrc: Oral  Weight: 225 lb 4.8 oz (102.2 kg)  Height: _0  (1.88 m)   Physical Exam Constitutional:  General: He is not in acute distress. Eyes:     Extraocular Movements: EOM normal.  Cardiovascular:     Rate and Rhythm: Normal rate and regular rhythm.     Heart sounds: Normal heart sounds.  Pulmonary:     Effort: Pulmonary effort is normal.     Breath sounds: Normal breath sounds.  Abdominal:     General: Bowel sounds are normal.     Palpations: Abdomen is soft.     Comments: No palpable hepatosplenomegaly  Lymphadenopathy:     Comments: No palpable cervical, supraclavicular, axillary or inguinal adenopathy   Skin:    General: Skin is warm and dry.  Neurological:     Mental Status: He is alert and oriented to person, place, and time.        CMP Latest Ref Rng & Units 07/19/2020  Glucose 70 - 99 mg/dL 188(H)  BUN 8 - 23 mg/dL 17  Creatinine 0.61 - 1.24 mg/dL 1.39(H)  Sodium 135 - 145 mmol/L 137  Potassium 3.5 - 5.1 mmol/L 3.9  Chloride 98 - 111 mmol/L 102  CO2 22 - 32 mmol/L 24  Calcium 8.9 - 10.3 mg/dL 9.1  Total Protein 6.5 - 8.1 g/dL 6.7  Total Bilirubin 0.3 - 1.2 mg/dL 0.6  Alkaline Phos 38 - 126 U/L 48  AST 15 - 41 U/L 37  ALT 0 - 44 U/L 47(H)   CBC Latest Ref Rng & Units 07/19/2020  WBC 4.0 - 10.5 K/uL 7.0  Hemoglobin 13.0 - 17.0 g/dL 14.8  Hematocrit 39.0 - 52.0 % 44.8  Platelets 150 - 400 K/uL 208    No images are attached to the encounter.  US Venous Img Lower Unilateral Right  Result Date: 07/08/2020 CLINICAL DATA:  History of DVT.  Right lower extremity pain EXAM: RIGHT LOWER EXTREMITY VENOUS  DOPPLER ULTRASOUND TECHNIQUE: Gray-scale sonography with compression, as well as color and duplex ultrasound, were performed to evaluate the deep venous system(s) from the level of the common femoral vein through the popliteal and proximal calf veins. COMPARISON:  None. FINDINGS: VENOUS Normal compressibility of the common femoral, superficial femoral, and popliteal veins, as well as the visualized calf veins. Visualized portions of profunda femoral vein and great saphenous vein unremarkable. No filling defects to suggest DVT on grayscale or color Doppler imaging. Doppler waveforms show normal direction of venous flow, normal respiratory plasticity and response to augmentation. Limited views of the contralateral common femoral vein are unremarkable. OTHER None. Limitations: none IMPRESSION: Negative. Electronically Signed   By: Kerby Moors M.D.   On: 07/08/2020 09:23   CT BIOPSY  Result Date: 07/04/2020 INDICATION: 64 year old male with a right adrenal mass. A ureteral stent has subsequently been placed and he presents for CT-guided biopsy. EXAM: CT-guided biopsy, right ureteral mass MEDICATIONS: None. ANESTHESIA/SEDATION: Moderate (conscious) sedation was employed during this procedure. A total of Versed 3 mg and Fentanyl 75 mcg was administered intravenously. Moderate Sedation Time: 28 minutes. The patient's level of consciousness and vital signs were monitored continuously by radiology nursing throughout the procedure under my direct supervision. FLUOROSCOPY TIME:  None. COMPLICATIONS: None immediate. PROCEDURE: Informed written consent was obtained from the patient after a thorough discussion of the procedural risks, benefits and alternatives. All questions were addressed. Maximal Sterile Barrier Technique was utilized including caps, mask, sterile gowns, sterile gloves, sterile drape, hand hygiene and skin antiseptic. A timeout was performed prior to the initiation of the procedure. Patient was placed  prone on the CT gantry. Axial CT  imaging was performed. The ureteral stent is easily visible. The patient's right exophytic ureteral mass was also identified. A suitable skin entry site was selected and marked. The overlying skin was sterilely prepped and draped in the standard fashion using chlorhexidine skin prep. Local anesthesia was attained by infiltration with 1% lidocaine. A small dermatotomy was made. Under intermittent CT guidance, a 20 cm 17 gauge introducer needle was advanced to the margin of the mass. Multiple 18 gauge core biopsies were then obtained coaxially. Biopsy specimens were placed in saline and formalin and delivered to pathology. The introducer needle was removed. Post biopsy axial CT imaging demonstrates no evidence of immediate complication. IMPRESSION: Technically successful CT-guided core biopsy of right ureteral mass. Electronically Signed   By: Jacqulynn Cadet M.D.   On: 07/04/2020 14:04   DG OR UROLOGY CYSTO IMAGE ([facility] ONLY)  Result Date: 07/02/2020 There is no interpretation for this exam.  This order is for images obtained during a surgical procedure.  Please See "Surgeries" Tab for more information regarding the procedure.   CT HEMATURIA WORKUP  Result Date: 06/21/2020 CLINICAL DATA:  Right ureteral mass. EXAM: CT ABDOMEN AND PELVIS WITHOUT AND WITH CONTRAST TECHNIQUE: Multidetector CT imaging of the abdomen and pelvis was performed following the standard protocol before and following the bolus administration of intravenous contrast. CONTRAST:  139m OMNIPAQUE IOHEXOL 350 MG/ML SOLN COMPARISON:  04/10/2020 FINDINGS: Lower chest: No acute abnormality. Hepatobiliary: No focal liver abnormality is seen. No gallstones, gallbladder wall thickening, or biliary dilatation. Pancreas: Unremarkable. No pancreatic ductal dilatation or surrounding inflammatory changes. Spleen: Normal in size without focal abnormality. Adrenals/Urinary Tract: Normal appearance of the adrenal glands.  Bilateral kidney cysts. The largest arises from the posterolateral cortex of the right inferior pole measuring 5 cm, image 46/9. No nephrolithiasis identified bilaterally. Enhancing soft tissue lesion involving the proximal right ureter measures 2.3 x 2.3 by 3.5 cm, image 55/17 and image 63/2. Additional, mild circumferential soft tissue thickening of the proximal ureter extends for approximately 2.6 cm, image 64/20. Previously this lesion measured 1.5 x 1.1 by 2.9 cm. No additional urinary tract lesions identified. Bladder unremarkable.1 Stomach/Bowel: Stomach is unremarkable. The appendix is visualized and appears normal. No bowel wall thickening, inflammation, or distension. Vascular/Lymphatic: Aortic atherosclerosis. No aneurysm. No retroperitoneal adenopathy identified. Left external iliac lymph node measures 1.2 cm short axis, image 77/9. Unchanged from previous exam. No inguinal adenopathy. Reproductive: Prostate is unremarkable. Other: Fat containing left inguinal hernia. Small fat containing periumbilical hernia is noted measuring 2.8 cm. No free fluid or fluid collections. Musculoskeletal: No acute or significant osseous findings. Degenerative disc disease noted at L5-S1. IMPRESSION: 1. Interval increase in size of enhancing soft tissue mass involving the proximal right ureter compatible with urothelial neoplasm. 2. Unchanged mildly enlarged left external iliac lymph node. No retroperitoneal adenopathy noted. 3. Bilateral kidney cysts. 4. Fat containing left inguinal and periumbilical hernias. 5. Aortic atherosclerosis. Aortic Atherosclerosis (ICD10-I70.0). Electronically Signed   By: TKerby MoorsM.D.   On: 06/21/2020 10:24    Assessment and plan- Patient is a 64y.o. male with newly diagnosed high-grade B-cell lymphoma involving the periureteral region  I have reviewed CT abdomen images from November 2021 independently and discussed findings with the patient.  Patient noted to have periureteral  mass that has enlarged as compared to August 2021.  There is no intraluminal filling defect and as such the masses extraluminal.  CT did not show any evidence of retroperitoneal adenopathy or hepatosplenomegaly.There was one left external iliac  lymph node noted which was 1.2 cm.  At present it is unclear if the lymphoma is only involving the ureter or other regions.  I would therefore like to get a PET scan and a bone marrow biopsy to complete his staging work-up.  I will also obtain CBC with differential, CMP, LDH, hepatitis B and C testing and uric acid today.  Biopsy specimen was very small and based on it it is hard to further subcharacterize the lymphoma.  We will await PET scan to see if excisional samples from elsewhere are possible.  If that is not possible I will have to discuss with pathology to see if we can work with the present specimen or if he needs further biopsies.  Patient has a history of antiphospholipid antibody syndrome and will need Coumadin to be held before repeat biopsies.  Discussed with the patient that high-grade B-cell lymphoma is a typically treated with systemic chemotherapy.  I am leaning towards 3 cycles of R-CHOP chemotherapy with involved field radiation.  It remains to be seen if doublet studies can be done on his present specimen.  Patient will proceed with port placement at the same time he gets a bone marrow biopsy.  Patient will also need a baseline echocardiogram prior to giving anthracycline.  I will see the patient back in 10 days time after these results are back to discuss further management.  Treatment will be given with a curative intent   Thank you for this kind referral and the opportunity to participate in the care of this patient   Visit Diagnosis 1. Lymphoma malignant, large cell ([place])   2. Goals of care, counseling/discussion     Dr. Randa Evens, MD, MPH [facility] [phone] 07/20/2020 10:32 AM

## 2020-07-20 NOTE — Telephone Encounter (Signed)
Called pt and went over the appts. For him . He has ECHO on 12/16  At 10 am, then 12/17 at arrival 7:30 for the bone marrow bx and port insertion at this time. Instructions for the bx is nothing to eat or drink after midnight Thursday. Have a driver to take you to procedure and back home. Pt will be there about 4 hours this day. Also he can stay on coumadin. It is not required for bone marrow bx or portacath insertion. Also since he has had the johnson and jonson vaccine he does not need to have covid test. Pt. States that when he spoke to Orrick this week in office she told him that he needs to get booster and she recommends Moderna. He got the booster today. Next appt is 12/23 for PET scan 11:30. [facility] and [facility] has been there before. He will call if he has any questions about above and he has my chart also.

## 2020-07-23 ENCOUNTER — Other Ambulatory Visit: Payer: Self-pay | Admitting: Radiology

## 2020-07-24 ENCOUNTER — Other Ambulatory Visit: Payer: Self-pay | Admitting: Urology

## 2020-07-24 NOTE — Progress Notes (Signed)
Patient on schedule for BMB/Port placement 07/27/2020, called and spoke with patient on phone with instructions given, made aware to be here @0730 , NPO after MN prior to procedure and driver for discharge post procedure/discharge. Stated understanding.

## 2020-07-25 ENCOUNTER — Other Ambulatory Visit: Payer: Self-pay | Admitting: Radiology

## 2020-07-26 ENCOUNTER — Ambulatory Visit
Admission: RE | Admit: 2020-07-26 | Discharge: 2020-07-26 | Disposition: A | Discharge status: Home / Self Care | Payer: Commercial Managed Care - PPO | Source: Ambulatory Visit | Attending: Oncology | Admitting: Oncology

## 2020-07-26 ENCOUNTER — Other Ambulatory Visit: Payer: Self-pay | Admitting: Radiology

## 2020-07-26 ENCOUNTER — Other Ambulatory Visit: Payer: Self-pay

## 2020-07-26 DIAGNOSIS — I340 Nonrheumatic mitral (valve) insufficiency: Secondary | ICD-10-CM | POA: Diagnosis not present

## 2020-07-26 DIAGNOSIS — Z0189 Encounter for other specified special examinations: Secondary | ICD-10-CM

## 2020-07-26 DIAGNOSIS — E119 Type 2 diabetes mellitus without complications: Secondary | ICD-10-CM | POA: Insufficient documentation

## 2020-07-26 DIAGNOSIS — C8580 Other specified types of non-Hodgkin lymphoma, unspecified site: Secondary | ICD-10-CM | POA: Diagnosis not present

## 2020-07-26 DIAGNOSIS — Z86718 Personal history of other venous thrombosis and embolism: Secondary | ICD-10-CM | POA: Diagnosis not present

## 2020-07-26 LAB — ECHOCARDIOGRAM COMPLETE
AR max vel: 2.39 cm2
AV Area VTI: 2.89 cm2
AV Area mean vel: 2.32 cm2
AV Mean grad: 3 mmHg
AV Peak grad: 4.2 mmHg
Ao pk vel: 1.02 m/s
Area-P 1/2: 2.62 cm2
S' Lateral: 2.44 cm

## 2020-07-26 NOTE — Progress Notes (Signed)
*  PRELIMINARY RESULTS* Echocardiogram 2D Echocardiogram has been performed.  Sherrie Sport 07/26/2020, 10:49 AM

## 2020-07-27 ENCOUNTER — Ambulatory Visit
Admission: RE | Admit: 2020-07-27 | Discharge: 2020-07-27 | Disposition: A | Discharge status: Home / Self Care | Payer: Commercial Managed Care - PPO | Source: Ambulatory Visit | Attending: Oncology | Admitting: Oncology

## 2020-07-27 ENCOUNTER — Other Ambulatory Visit: Payer: Self-pay

## 2020-07-27 DIAGNOSIS — Z7984 Long term (current) use of oral hypoglycemic drugs: Secondary | ICD-10-CM | POA: Diagnosis not present

## 2020-07-27 DIAGNOSIS — C8590 Non-Hodgkin lymphoma, unspecified, unspecified site: Secondary | ICD-10-CM | POA: Insufficient documentation

## 2020-07-27 DIAGNOSIS — I10 Essential (primary) hypertension: Secondary | ICD-10-CM | POA: Insufficient documentation

## 2020-07-27 DIAGNOSIS — Z87891 Personal history of nicotine dependence: Secondary | ICD-10-CM | POA: Insufficient documentation

## 2020-07-27 DIAGNOSIS — Z79899 Other long term (current) drug therapy: Secondary | ICD-10-CM | POA: Diagnosis not present

## 2020-07-27 DIAGNOSIS — Z7901 Long term (current) use of anticoagulants: Secondary | ICD-10-CM | POA: Diagnosis not present

## 2020-07-27 DIAGNOSIS — C8580 Other specified types of non-Hodgkin lymphoma, unspecified site: Secondary | ICD-10-CM

## 2020-07-27 DIAGNOSIS — E119 Type 2 diabetes mellitus without complications: Secondary | ICD-10-CM | POA: Diagnosis not present

## 2020-07-27 HISTORY — PX: IR IMAGING GUIDED PORT INSERTION: IMG5740

## 2020-07-27 LAB — CBC WITH DIFFERENTIAL/PLATELET
Abs Immature Granulocytes: 0.05 10*3/uL (ref 0.00–0.07)
Basophils Absolute: 0.1 10*3/uL (ref 0.0–0.1)
Basophils Relative: 1 %
Eosinophils Absolute: 0.2 10*3/uL (ref 0.0–0.5)
Eosinophils Relative: 4 %
HCT: 44.6 % (ref 39.0–52.0)
Hemoglobin: 14.7 g/dL (ref 13.0–17.0)
Immature Granulocytes: 1 %
Lymphocytes Relative: 29 %
Lymphs Abs: 1.9 10*3/uL (ref 0.7–4.0)
MCH: 28.8 pg (ref 26.0–34.0)
MCHC: 33 g/dL (ref 30.0–36.0)
MCV: 87.5 fL (ref 80.0–100.0)
Monocytes Absolute: 0.6 10*3/uL (ref 0.1–1.0)
Monocytes Relative: 9 %
Neutro Abs: 3.8 10*3/uL (ref 1.7–7.7)
Neutrophils Relative %: 56 %
Platelets: 231 10*3/uL (ref 150–400)
RBC: 5.1 MIL/uL (ref 4.22–5.81)
RDW: 13.7 % (ref 11.5–15.5)
WBC: 6.6 10*3/uL (ref 4.0–10.5)
nRBC: 0 % (ref 0.0–0.2)

## 2020-07-27 MED ORDER — FENTANYL CITRATE (PF) 100 MCG/2ML IJ SOLN
INTRAMUSCULAR | Status: AC
  Filled 2020-07-27: qty 2

## 2020-07-27 MED ORDER — MIDAZOLAM HCL 2 MG/2ML IJ SOLN
INTRAMUSCULAR | Status: AC | PRN
  Administered 2020-07-27 (×2): 1 mg via INTRAVENOUS

## 2020-07-27 MED ORDER — MIDAZOLAM HCL 2 MG/2ML IJ SOLN
INTRAMUSCULAR | Status: AC
  Filled 2020-07-27: qty 2

## 2020-07-27 MED ORDER — FENTANYL CITRATE (PF) 100 MCG/2ML IJ SOLN
INTRAMUSCULAR | Status: AC | PRN
  Administered 2020-07-27 (×2): 50 ug via INTRAVENOUS

## 2020-07-27 MED ORDER — CEFAZOLIN SODIUM-DEXTROSE 1-4 GM/50ML-% IV SOLN
INTRAVENOUS | Status: DC | PRN
  Administered 2020-07-27: 2 g via INTRAVENOUS

## 2020-07-27 MED ORDER — SODIUM CHLORIDE 0.9 % IV SOLN: INTRAVENOUS | Status: DC

## 2020-07-27 MED ORDER — MIDAZOLAM HCL 5 MG/5ML IJ SOLN
INTRAMUSCULAR | Status: DC | PRN
  Administered 2020-07-27: 1 mg via INTRAVENOUS

## 2020-07-27 MED ORDER — FENTANYL CITRATE (PF) 100 MCG/2ML IJ SOLN
INTRAMUSCULAR | Status: DC | PRN
  Administered 2020-07-27: 50 ug via INTRAVENOUS

## 2020-07-27 NOTE — Consult Note (Signed)
Chief Complaint: Large B-cell lymphoma  Referring Physician(s): Rao,Archana C  Patient Status: [facility] - Out-pt  History of Present Illness: Eddie Ryan is a 64 y.o. male with past medical history significant for collagen, diabetes, hypertension, DVT due to antiphospholipid lipid antibody syndrome (on Coumadin) with recent diagnosis of B-cell lymphoma following CT guided right periureteral biopsy on 07/04/2020 by Dr. Laurence Ferrari.  Patient presents today for CT-guided bone marrow biopsy as well as portacatheter placement.  Patient is again without complaint.  Specifically, no change in appetite or energy level.  No unintentional weight loss or weight gain.  No chest pain, shortness of breath, fever or chills.    Past Medical History:  Diagnosis Date  . Anxiety   . Collagen vascular disease ([place])   . Depression   . Diabetes mellitus without complication ([place])   . DVT (deep venous thrombosis) ([place])   . History of kidney stones   . Hypertension   . Lymphoma ([place]) 07/2020  . Sleep apnea     Past Surgical History:  Procedure Laterality Date  . CERVICAL FUSION    . CYSTOSCOPY W/ RETROGRADES Left 04/26/2020   Procedure: CYSTOSCOPY WITH RETROGRADE PYELOGRAM;  Surgeon: Hollice Espy, MD;  Location: [facility] ORS;  Service: Urology;  Laterality: Left;  . CYSTOSCOPY WITH URETEROSCOPY AND STENT PLACEMENT Right 07/02/2020   Procedure: CYSTOSCOPY WITH URETEROSCOPY AND STENT PLACEMENT;  Surgeon: Hollice Espy, MD;  Location: [facility] ORS;  Service: Urology;  Laterality: Right;  . CYSTOSCOPY/URETEROSCOPY/HOLMIUM LASER/STENT PLACEMENT Right 04/26/2020   Procedure: CYSTOSCOPY/URETEROSCOPY/HOLMIUM LASER/STENT PLACEMENT;  Surgeon: Hollice Espy, MD;  Location: [facility] ORS;  Service: Urology;  Laterality: Right;  . PLANTAR FASCIA SURGERY Bilateral   . ROTATOR CUFF REPAIR Left   . THUMB FUSION    . URETERAL BIOPSY Right 04/26/2020   Procedure: URETERAL BIOPSY with ablation;  Surgeon: Hollice Espy, MD;  Location: [facility] ORS;  Service: Urology;  Laterality: Right;  . URETEROSCOPY WITH HOLMIUM LASER LITHOTRIPSY      Allergies: Losartan potassium-hctz and Morphine and related  Medications: Prior to Admission medications   Medication Sig Start Date End Date Taking? Authorizing Provider  ALPRAZolam Duanne Moron) 0.5 MG tablet Take 0.5 mg by mouth in the morning and at bedtime.  01/29/20  Yes [provider]  buPROPion (WELLBUTRIN XL) 150 MG 24 hr tablet Take 300 mg by mouth daily.  01/29/20  Yes [provider]  Cyanocobalamin (VITAMIN B-12 PO) Take 1 capsule by mouth daily. Pure Encapsulations B12 Folate Capsules   Yes [provider]  diclofenac Sodium (VOLTAREN) 1 % GEL Apply 1 g topically 4 (four) times daily as needed (thumb as needed for pain.).    Yes [provider]  docusate sodium (COLACE) 100 MG capsule Take 200 mg by mouth daily.   Yes [provider]  ergocalciferol (VITAMIN D2) 1.25 MG (50000 UT) capsule Take 50,000 Units by mouth every Monday.    Yes [provider]  etodolac (LODINE) 400 MG tablet Take 400 mg by mouth daily with breakfast. 06/07/20  Yes [provider]  glipiZIDE (GLUCOTROL XL) 10 MG 24 hr tablet Take 10 mg by mouth daily with breakfast. 06/07/20  Yes [provider]  loratadine (CLARITIN) 10 MG tablet Take 10 mg by mouth daily.    Yes [provider]  Magnesium 500 MG TABS Take 1,000 mg by mouth daily.   Yes [provider]  meclizine (ANTIVERT) 25 MG tablet Take 25 mg by mouth 2 (two) times daily as needed  for dizziness.  06/07/20  Yes [provider]  oxyCODONE-acetaminophen (PERCOCET) 5-325 MG tablet Take 1-2 tablets by mouth every 4 (four) hours as needed for moderate pain or severe pain. 07/02/20  Yes Hollice Espy, MD  telmisartan (MICARDIS) 80 MG tablet Take 40 mg by mouth every evening.  01/29/20  Yes [provider]  warfarin (COUMADIN) 5 MG  tablet Take 5 mg by mouth every evening. Take 1.5 tablets (7.5 mg) by mouth on Saturdays in the evening & take 1 tablet (5 mg) by mouth on Sundays, Mondays, Tuesdays, Wednesdays, Thursdays & Fridays. 03/23/19  Yes [provider]  escitalopram (LEXAPRO) 20 MG tablet Take 20 mg by mouth daily.  01/29/20   [provider]     Family History  Problem Relation Age of Onset  . Pancreatic cancer Father   . Prostate cancer Paternal Grandmother   . Leukemia Nephew     Social History   Socioeconomic History  . Marital status: Single    Spouse name: Not on file  . Number of children: 0  . Years of education: Not on file  . 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 Smoking status: Former Smoker    Types: Cigarettes    Quit date: 1981    Years since quitting: 40.9  . Smokeless tobacco: Never Used  Vaping Use  . Vaping Use: Never used  Substance and Sexual Activity  . Alcohol use: Never  . Drug use: Never  . Sexual activity: Not Currently  Other Topics Concern  . Not on file  Social History Narrative   Lives by himself..   Social Determinants of Health   Financial Resource Strain: Not on file  Food Insecurity: Not on file  Transportation Needs: Not on file  Physical Activity: Not on file  Stress: Not on file  Social Connections: Not on file    ECOG Status: 0 - Asymptomatic  Review of Systems: A 12 point ROS discussed and pertinent positives are indicated in the HPI above.  All other systems are negative.  Review of Systems  Vital Signs: BP 110/87   Pulse 76   Temp 98.6 F (37 C) (Oral)   Resp 20   Ht _0  (1.88 m)   Wt 104.3 kg   SpO2 96%   BMI 29.53 kg/m   Physical Exam  Imaging: US Venous Img Lower Unilateral Right  Result Date: 07/08/2020 CLINICAL DATA:  History of DVT.  Right lower extremity pain EXAM: RIGHT LOWER EXTREMITY VENOUS DOPPLER ULTRASOUND  TECHNIQUE: Gray-scale sonography with compression, as well as color and duplex ultrasound, were performed to evaluate the deep venous system(s) from the level of the common femoral vein through the popliteal and proximal calf veins. COMPARISON:  None. FINDINGS: VENOUS Normal compressibility of the common femoral, superficial femoral, and popliteal veins, as well as the visualized calf veins. Visualized portions of profunda femoral vein and great saphenous vein unremarkable. No filling defects to suggest DVT on grayscale or color Doppler imaging. Doppler waveforms show normal direction of venous flow, normal respiratory plasticity and response to augmentation. Limited views of the contralateral common femoral vein are unremarkable. OTHER None. Limitations: none IMPRESSION: Negative. Electronically Signed   By: Kerby Moors M.D.   On: 07/08/2020 09:23   CT BIOPSY  Result Date: 07/04/2020 INDICATION: 64 year old male with a right adrenal mass. A ureteral stent has subsequently been placed and he  presents for CT-guided biopsy. EXAM: CT-guided biopsy, right ureteral mass MEDICATIONS: None. ANESTHESIA/SEDATION: Moderate (conscious) sedation was employed during this procedure. A total of Versed 3 mg and Fentanyl 75 mcg was administered intravenously. Moderate Sedation Time: 28 minutes. The patient's level of consciousness and vital signs were monitored continuously by radiology nursing throughout the procedure under my direct supervision. FLUOROSCOPY TIME:  None. COMPLICATIONS: None immediate. PROCEDURE: Informed written consent was obtained from the patient after a thorough discussion of the procedural risks, benefits and alternatives. All questions were addressed. Maximal Sterile Barrier Technique was utilized including caps, mask, sterile gowns, sterile gloves, sterile drape, hand hygiene and skin antiseptic. A timeout was performed prior to the initiation of the procedure. Patient was placed prone on the CT  gantry. Axial CT imaging was performed. The ureteral stent is easily visible. The patient's right exophytic ureteral mass was also identified. A suitable skin entry site was selected and marked. The overlying skin was sterilely prepped and draped in the standard fashion using chlorhexidine skin prep. Local anesthesia was attained by infiltration with 1% lidocaine. A small dermatotomy was made. Under intermittent CT guidance, a 20 cm 17 gauge introducer needle was advanced to the margin of the mass. Multiple 18 gauge core biopsies were then obtained coaxially. Biopsy specimens were placed in saline and formalin and delivered to pathology. The introducer needle was removed. Post biopsy axial CT imaging demonstrates no evidence of immediate complication. IMPRESSION: Technically successful CT-guided core biopsy of right ureteral mass. Electronically Signed   By: Jacqulynn Cadet M.D.   On: 07/04/2020 14:04   DG OR UROLOGY CYSTO IMAGE ([facility] ONLY)  Result Date: 07/02/2020 There is no interpretation for this exam.  This order is for images obtained during a surgical procedure.  Please See "Surgeries" Tab for more information regarding the procedure.   ECHOCARDIOGRAM COMPLETE  Result Date: 07/26/2020    ECHOCARDIOGRAM REPORT   Patient Name:   Eddie Ryan [facility] Date of Exam: 07/26/2020 Medical Rec #:  [id number]      Height:       74.0 in Accession #:    [id number]     Weight:       225.3 lb Date of Birth:  [birth year]       BSA:          2.286 m Patient Age:    83 years       BP:           136/78 mmHg Patient Gender: M              HR:           76 bpm. Exam Location:  [facility] Procedure: 2D Echo, Cardiac Doppler, Color Doppler and Strain Analysis Indications:     Chemo V 67.2  History:         Patient has no prior history of Echocardiogram examinations.                  Risk Factors:Diabetes. DVT.  Sonographer:     Sherrie Sport RDCS (AE) Referring Phys:  [id number] Weston Anna RAO Diagnosing Phys: Ida Rogue MD  Sonographer  Comments: Global longitudinal strain was attempted. IMPRESSIONS  1. Left ventricular ejection fraction, by estimation, is 60 to 65%. The left ventricle has normal function. The left ventricle has no regional wall motion abnormalities. Left ventricular diastolic parameters are consistent with Grade I diastolic dysfunction (impaired relaxation). The average left ventricular global longitudinal strain is -16.8 %. The global longitudinal strain  is normal.  2. Right ventricular systolic function is normal. The right ventricular size is normal. FINDINGS  Left Ventricle: Left ventricular ejection fraction, by estimation, is 60 to 65%. The left ventricle has normal function. The left ventricle has no regional wall motion abnormalities. The average left ventricular global longitudinal strain is -16.8 %. The global longitudinal strain is normal. The left ventricular internal cavity size was normal in size. There is no left ventricular hypertrophy. Left ventricular diastolic parameters are consistent with Grade I diastolic dysfunction (impaired relaxation). Right Ventricle: The right ventricular size is normal. No increase in right ventricular wall thickness. Right ventricular systolic function is normal. Left Atrium: Left atrial size was normal in size. Right Atrium: Right atrial size was normal in size. Pericardium: There is no evidence of pericardial effusion. Mitral Valve: The mitral valve is normal in structure. Mild mitral valve regurgitation. No evidence of mitral valve stenosis. Tricuspid Valve: The tricuspid valve is normal in structure. Tricuspid valve regurgitation is not demonstrated. No evidence of tricuspid stenosis. Aortic Valve: The aortic valve is normal in structure. Aortic valve regurgitation is not visualized. No aortic stenosis is present. Aortic valve mean gradient measures 3.0 mmHg. Aortic valve peak gradient measures 4.2 mmHg. Aortic valve area, by VTI measures 2.89 cm. Pulmonic Valve: The pulmonic  valve was normal in structure. Pulmonic valve regurgitation is not visualized. No evidence of pulmonic stenosis. Aorta: The aortic root is normal in size and structure. Venous: The inferior vena cava is normal in size with greater than 50% respiratory variability, suggesting right atrial pressure of 3 mmHg. IAS/Shunts: No atrial level shunt detected by color flow Doppler.  LEFT VENTRICLE PLAX 2D LVIDd:         4.48 cm  Diastology LVIDs:         2.44 cm  LV e' medial:    5.33 cm/s LV PW:         0.97 cm  LV E/e' medial:  12.9 LV IVS:        0.95 cm  LV e' lateral:   11.20 cm/s LVOT diam:     2.00 cm  LV E/e' lateral: 6.1 LV SV:         58 LV SV Index:   25       2D Longitudinal Strain LVOT Area:     3.14 cm 2D Strain GLS (A2C):   -16.4 %                         2D Strain GLS (A3C):   -16.8 %                         2D Strain GLS (A4C):   -17.4 %                         2D Strain GLS Avg:     -16.8 %                          3D Volume EF:                         3D EF:        52 %                         LV EDV:  107 ml                         LV ESV:       51 ml                         LV SV:        55 ml RIGHT VENTRICLE RV Basal diam:  1.74 cm RV S prime:     10.20 cm/s TAPSE (M-mode): 3.8 cm LEFT ATRIUM             Index       RIGHT ATRIUM           Index LA diam:        2.70 cm 1.18 cm/m  RA Area:     17.10 cm LA Vol (A2C):   39.9 ml 17.45 ml/m RA Volume:   48.20 ml  21.08 ml/m LA Vol (A4C):   22.4 ml 9.80 ml/m LA Biplane Vol: 32.0 ml 14.00 ml/m  AORTIC VALVE                   PULMONIC VALVE AV Area (Vmax):    2.39 cm    PV Vmax:        0.75 m/s AV Area (Vmean):   2.32 cm    PV Peak grad:   2.2 mmHg AV Area (VTI):     2.89 cm    RVOT Peak grad: 3 mmHg AV Vmax:           102.00 cm/s AV Vmean:          74.800 cm/s AV VTI:            0.201 m AV Peak Grad:      4.2 mmHg AV Mean Grad:      3.0 mmHg LVOT Vmax:         77.70 cm/s LVOT Vmean:        55.300 cm/s LVOT VTI:          0.185 m LVOT/AV VTI ratio:  0.92  AORTA Ao Root diam: 3.50 cm MITRAL VALVE               TRICUSPID VALVE MV Area (PHT): 2.62 cm    TR Peak grad:   15.2 mmHg MV Decel Time: 290 msec    TR Vmax:        195.00 cm/s MV E velocity: 68.60 cm/s MV A velocity: 59.60 cm/s  SHUNTS MV E/A ratio:  1.15        Systemic VTI:  0.18 m                            Systemic Diam: 2.00 cm Ida Rogue MD Electronically signed by Ida Rogue MD Signature Date/Time: 07/26/2020/4:34:22 PM    Final     Labs:  CBC: Recent Labs    07/04/20 1004 07/08/20 0850 07/19/20 0945  WBC 5.2 4.7 7.0  HGB 13.9 14.2 14.8  HCT 42.4 43.3 44.8  PLT 153 123* 208    COAGS: Recent Labs    07/02/20 1202 07/08/20 0850  INR 1.0 1.1    BMP: Recent Labs    07/04/20 1004 07/08/20 0850 07/19/20 0945  NA 138 134* 137  K 4.3 4.4 3.9  CL 103 102 102  CO2 _0 GLUCOSE 142* 280* 188*  BUN 17 21 17  CALCIUM 9.0 9.2 9.1  CREATININE 1.50* 1.65* 1.39*  GFRNONAA 52* 46* 57*    LIVER FUNCTION TESTS: Recent Labs    07/19/20 0945  BILITOT 0.6  AST 37  ALT 47*  ALKPHOS 48  PROT 6.7  ALBUMIN 3.9    TUMOR MARKERS: No results for input(s): AFPTM, CEA, CA199, CHROMGRNA in the last 8760 hours.  Assessment and Plan:  Eddie Ryan is a 64 y.o. male with past medical history significant for collagen, diabetes, hypertension, DVT (on Coumadin) with recent diagnosis of B-cell lymphoma following CT guided right periureteral biopsy on 07/04/2020 by Dr. Laurence Ferrari.  Patient presents today for CT-guided bone marrow biopsy as well as portacatheter placement.  Risks and benefits of image guided port-a-catheter placement was discussed with the patient including, but not limited to bleeding, infection, pneumothorax, or fibrin sheath development and need for additional procedures.  Risks and benefits of CT guided BM Bx was discussed with the patient and/or patient's family including, but not limited to bleeding, infection, damage to adjacent structures  or low yield requiring additional tests.  All of the questions were answered and there is agreement to proceed.  Consent signed and in chart.  A copy of this report was sent to the requesting provider on this date.  Electronically Signed: Sandi Mariscal, MD 07/27/2020, 8:10 AM   I spent a total of 15 Minutes in face to face in clinical consultation, greater than 50% of which was counseling/coordinating care for CT-guided bone marrow biopsy and portacatheter placement.

## 2020-07-27 NOTE — Procedures (Signed)
Pre Procedure Dx: Poor venous access Post Procedural Dx: Same  Successful placement of right IJ approach port-a-cath with tip at the superior caval atrial junction. The catheter is ready for immediate use.  Estimated Blood Loss: Minimal  Complications: None immediate.  Jay Maguire Killmer, MD Pager #: [phone]   

## 2020-07-27 NOTE — Procedures (Signed)
Pre-procedure Diagnosis: Lymphoma Post-procedure Diagnosis: Same  Technically successful CT guided bone marrow aspiration and biopsy of left iliac crest.   Complications: None Immediate  EBL: None  Signed: Sandi Mariscal Pager: [phone] 07/27/2020, 9:04 AM

## 2020-07-27 NOTE — Progress Notes (Signed)
Patient clinically stable post BMB per Dr Pascal Lux, tolerated well. Taken back to specials to await Port Placement later this am. Awake/alert and oriented post procedure. Report given to [facility] Rn in specials post procedure while awaiting port placement.

## 2020-07-31 LAB — SURGICAL PATHOLOGY

## 2020-08-02 ENCOUNTER — Other Ambulatory Visit: Payer: Self-pay

## 2020-08-02 ENCOUNTER — Ambulatory Visit
Admission: RE | Admit: 2020-08-02 | Discharge: 2020-08-02 | Disposition: A | Discharge status: Home / Self Care | Payer: Commercial Managed Care - PPO | Source: Ambulatory Visit | Attending: Oncology | Admitting: Oncology

## 2020-08-02 DIAGNOSIS — C8580 Other specified types of non-Hodgkin lymphoma, unspecified site: Secondary | ICD-10-CM | POA: Diagnosis present

## 2020-08-02 LAB — GLUCOSE, CAPILLARY: Glucose-Capillary: 127 mg/dL — ABNORMAL HIGH (ref 70–99)

## 2020-08-02 MED ORDER — FLUDEOXYGLUCOSE F - 18 (FDG) INJECTION
12.1000 | Freq: Once | INTRAVENOUS | Status: AC | PRN
  Administered 2020-08-02: 12:00:00 12.1 via INTRAVENOUS

## 2020-08-06 ENCOUNTER — Inpatient Hospital Stay (HOSPITAL_BASED_OUTPATIENT_CLINIC_OR_DEPARTMENT_OTHER): Payer: Commercial Managed Care - PPO | Admitting: Oncology

## 2020-08-06 VITALS — BP 137/86 | HR 71 | Resp 20 | Wt 226.6 lb

## 2020-08-06 DIAGNOSIS — Z79620 Long term (current) use of immunosuppressive biologic: Secondary | ICD-10-CM

## 2020-08-06 DIAGNOSIS — Z7189 Other specified counseling: Secondary | ICD-10-CM

## 2020-08-06 DIAGNOSIS — C8339 Diffuse large B-cell lymphoma, extranodal and solid organ sites: Secondary | ICD-10-CM | POA: Diagnosis not present

## 2020-08-06 DIAGNOSIS — Z5181 Encounter for therapeutic drug level monitoring: Secondary | ICD-10-CM

## 2020-08-06 DIAGNOSIS — Z79899 Other long term (current) drug therapy: Secondary | ICD-10-CM

## 2020-08-06 DIAGNOSIS — Z5111 Encounter for antineoplastic chemotherapy: Secondary | ICD-10-CM

## 2020-08-06 DIAGNOSIS — C83398 Diffuse large b-cell lymphoma of other extranodal and solid organ sites: Secondary | ICD-10-CM

## 2020-08-06 DIAGNOSIS — C8516 Unspecified B-cell lymphoma, intrapelvic lymph nodes: Secondary | ICD-10-CM | POA: Diagnosis not present

## 2020-08-06 MED ORDER — PROCHLORPERAZINE MALEATE 10 MG PO TABS: 10.0000 mg | ORAL_TABLET | Freq: Four times a day (QID) | ORAL | 6 refills | Status: DC | PRN

## 2020-08-06 MED ORDER — ALLOPURINOL 300 MG PO TABS: 300.0000 mg | ORAL_TABLET | Freq: Every day | ORAL | 3 refills | Status: DC

## 2020-08-06 MED ORDER — LIDOCAINE-PRILOCAINE 2.5-2.5 % EX CREA: TOPICAL_CREAM | CUTANEOUS | 3 refills | Status: DC

## 2020-08-06 MED ORDER — PREDNISONE 20 MG PO TABS: 100.0000 mg | ORAL_TABLET | Freq: Every day | ORAL | 5 refills | Status: DC

## 2020-08-06 MED ORDER — ONDANSETRON HCL 8 MG PO TABS: 8.0000 mg | ORAL_TABLET | Freq: Two times a day (BID) | ORAL | 1 refills | Status: DC | PRN

## 2020-08-06 NOTE — Addendum Note (Signed)
Addended by: Soledad Gerlach on: 08/06/2020 04:27 PM   Modules accepted: Orders

## 2020-08-06 NOTE — Progress Notes (Signed)
START ON PATHWAY REGIMEN - Lymphoma and CLL     A cycle is every 21 days:     Prednisone      Rituximab-xxxx      Cyclophosphamide      Doxorubicin      Vincristine   **Always confirm dose/schedule in your pharmacy ordering system**  Patient Characteristics: Diffuse Large B-Cell Lymphoma or Follicular Lymphoma, Grade 3B, First Line, Stage I and II, No Bulk Disease Type: Not Applicable Disease Type: Diffuse Large B-Cell Lymphoma Disease Type: Not Applicable Line of therapy: First Line Disease Characteristics: No Bulk Intent of Therapy: Curative Intent, Discussed with Patient 

## 2020-08-06 NOTE — Progress Notes (Signed)
Hematology/Oncology Consult note [facility]  Telephone:([phone] [phone]  Patient Care Team: Rusty Aus, MD as PCP - General (Internal Medicine)   Name of the patient: Eddie Ryan  [id number]  1956-01-27   Date of visit: 08/06/20  Diagnosis-stage I E high-grade B-cell lymphoma  Chief complaint/ Reason for visit-discuss PET CT scan and bone marrow biopsy results and further management  Heme/Onc history: Patient is a 64 year old male with a prior history of DVT about 15 years ago and was diagnosed with antiphospholipid antibody syndrome with a positive lupus anticoagulant.  He has remained on Coumadin since then.  He has been seeing Dr. Erlene Quan for kidney stones.  He had a CT abdomen and pelvis in August 2021 which showed thickening of the urothelium concerning for a urothelial lesion.  He underwent a cystoscopy and ureteroscopy in September 2021 which did not show any intraluminal pathology.  Plan was to get a repeat CT urogram 6 weeks following that.  He had a CT hematuria work-up again in November 2021 which showed enhancing soft tissue lesion in the proximal right ureter measuring 2.3 x 2.3 x 3.5 cm which was extending for 2.6 cm and enlarges compared to prior size of 1.5 x 1.1 x 2.9 cm.  There is no evidence of retroperitoneal adenopathy.  Left external iliac node was 1.2 cm and was unchanged as compared to prior CT scan.  He underwent a repeat ureteroscopy with stent placement.  There was no obvious filling defect but there was a slight narrowing of ureter and patient therefore underwent a CT-guided biopsy by IR  Biopsy showed B-cell lymphoma High-grade with a Ki-67 of greater than 90%. Comment:  Core biopsy sections display lymphoid tissue. There are aggregates  comprised of larger lymphocytes, interspersed with areas of small mature  lymphocytes, and focal background bands of fibrosis. Typical follicular  architecture is not present.    Immunohistochemical studies show strong and diffuse positivity for CD20,  indicating a B cell proliferation, comprised of both smaller and larger  lymphocytes. CD3 marks scattered background T cells. The subset of  larger B cells shows positivity for CD10, BCL6, and MUM-1 (partial).  These cells are negative for BCL2, Cyclin-D1, and CD30. BCL2 appears to  show marking in the subset of smaller lymphocytes, as well as normal T  cells. Ki-67 is significantly elevated in the subset of larger B cells,  estimated at greater than 90% staining. C-myc shows dim staining in a  portion of the larger B cells, approximately 20-30%. Pancytokeratin and  CD56 are negative.   Flow cytometric studies detect an abnormal CD10+ B cell population in a  background of polytypic B cells. These CD10+ B cells represent 28% of B  cells and show very dim kappa light chain restriction, to negative light  chain expression. By light scatter, these B cells appear approximately  the same size as background T cells. There is no loss of, or aberrant  expression of, pan T-cell antigens to suggest a neoplastic T cell  process. Cell viability in this sample is sufficient for analysis, but  less than optimal.   Overall classification of this process is limited due to the small size  of core biopsy samples. The histologic and immunophenotypic findings are  compatible with involvement by a B cell lymphoma. CD10 expression, as  seen by immunohistochemistry and flow cytometry, may imply  germinal/follicular center origin of these B cells. Absence of staining  for BCL2 could suggest against  follicular lymphoma, although BCL2  expression may be absent in 25-50% of cases of grade 3 follicular  lymphoma. The differential diagnosis may also include diffuse large B  cell lymphoma, given the increased proportion of larger abnormal B  cells. Further subclassification of this process would be dependent upon  excisional biopsy or  review of overall lesional architecture. There is  sufficient tissue for ancillary FISH or NGS testing, if desired.   Bone marrow biopsy was negative for lymphoma.  PET/CT scan showed:A 3.3 x 2.7 cm soft tissue lesion around the right proximal ureter with an SUV of 13.4.  There was no other hypermetabolic abdominopelvic adenopathy noted.  No hypermetabolism noted in the liver or spleen or other organs.  No intrathoracic adenopathy.  No hypermetabolic activity noted in the bones.    Interval history-patient reports doing well and denies any complaints at this time.  Appetite and weight have remained stable  ECOG PS- .0 Pain scale- 0   Review of systems- Review of Systems  Constitutional: Negative for chills, fever, malaise/fatigue and weight loss.  HENT: Negative for congestion, ear discharge and nosebleeds.   Eyes: Negative for blurred vision.  Respiratory: Negative for cough, hemoptysis, sputum production, shortness of breath and wheezing.   Cardiovascular: Negative for chest pain, palpitations, orthopnea and claudication.  Gastrointestinal: Negative for abdominal pain, blood in stool, constipation, diarrhea, heartburn, melena, nausea and vomiting.  Genitourinary: Negative for dysuria, flank pain, frequency, hematuria and urgency.  Musculoskeletal: Negative for back pain, joint pain and myalgias.  Skin: Negative for rash.  Neurological: Negative for dizziness, tingling, focal weakness, seizures, weakness and headaches.  Endo/Heme/Allergies: Does not bruise/bleed easily.  Psychiatric/Behavioral: Negative for depression and suicidal ideas. The patient does not have insomnia.       Allergies  Allergen Reactions  . Losartan Potassium-Hctz Other (See Comments)    Unknown reaction.  . Morphine And Related Itching    WITH PROLONGED USE     Past Medical History:  Diagnosis Date  . Anxiety   . Collagen vascular disease ([place])   . Depression   . Diabetes mellitus without  complication ([place])   . DVT (deep venous thrombosis) ([place])   . History of kidney stones   . Hypertension   . Lymphoma ([place]) 07/2020  . Sleep apnea      Past Surgical History:  Procedure Laterality Date  . CERVICAL FUSION    . CYSTOSCOPY W/ RETROGRADES Left 04/26/2020   Procedure: CYSTOSCOPY WITH RETROGRADE PYELOGRAM;  Surgeon: Hollice Espy, MD;  Location: [facility] ORS;  Service: Urology;  Laterality: Left;  . CYSTOSCOPY WITH URETEROSCOPY AND STENT PLACEMENT Right 07/02/2020   Procedure: CYSTOSCOPY WITH URETEROSCOPY AND STENT PLACEMENT;  Surgeon: Hollice Espy, MD;  Location: [facility] ORS;  Service: Urology;  Laterality: Right;  . CYSTOSCOPY/URETEROSCOPY/HOLMIUM LASER/STENT PLACEMENT Right 04/26/2020   Procedure: CYSTOSCOPY/URETEROSCOPY/HOLMIUM LASER/STENT PLACEMENT;  Surgeon: Hollice Espy, MD;  Location: [facility] ORS;  Service: Urology;  Laterality: Right;  . IR IMAGING GUIDED PORT INSERTION  07/27/2020  . PLANTAR FASCIA SURGERY Bilateral   . ROTATOR CUFF REPAIR Left   . THUMB FUSION    . URETERAL BIOPSY Right 04/26/2020   Procedure: URETERAL BIOPSY with ablation;  Surgeon: Hollice Espy, MD;  Location: [facility] ORS;  Service: Urology;  Laterality: Right;  . URETEROSCOPY WITH HOLMIUM LASER LITHOTRIPSY      Social History   Socioeconomic History  . Marital status: Single    Spouse name: Not on file  . Number of children: 0  . Years of  education: Not on file  . 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 Smoking status: Former Smoker    Types: Cigarettes    Quit date: 1981    Years since quitting: 41.0  . Smokeless tobacco: Never Used  Vaping Use  . Vaping Use: Never used  Substance and Sexual Activity  . Alcohol use: Never  . Drug use: Never  . Sexual activity: Not Currently  Other Topics Concern  . Not on file  Social History Narrative   Lives by himself..   Social Determinants of Health    Financial Resource Strain: Not on file  Food Insecurity: Not on file  Transportation Needs: Not on file  Physical Activity: Not on file  Stress: Not on file  Social Connections: Not on file  Intimate Partner Violence: Not on file    Family History  Problem Relation Age of Onset  . Pancreatic cancer Father   . Prostate cancer Paternal Grandmother   . Leukemia Nephew      Current Outpatient Medications:  .  ALPRAZolam (XANAX) 0.5 MG tablet, Take 0.5 mg by mouth in the morning and at bedtime. , Disp: , Rfl:  .  buPROPion (WELLBUTRIN XL) 150 MG 24 hr tablet, Take 300 mg by mouth daily. , Disp: , Rfl:  .  Cyanocobalamin (VITAMIN B-12 PO), Take 1 capsule by mouth daily. Pure Encapsulations B12 Folate Capsules, Disp: , Rfl:  .  diclofenac Sodium (VOLTAREN) 1 % GEL, Apply 1 g topically 4 (four) times daily as needed (thumb as needed for pain.). , Disp: , Rfl:  .  docusate sodium (COLACE) 100 MG capsule, Take 200 mg by mouth daily., Disp: , Rfl:  .  ergocalciferol (VITAMIN D2) 1.25 MG (50000 UT) capsule, Take 50,000 Units by mouth every Monday. , Disp: , Rfl:  .  escitalopram (LEXAPRO) 20 MG tablet, Take 20 mg by mouth daily. , Disp: , Rfl:  .  etodolac (LODINE) 400 MG tablet, Take 400 mg by mouth daily with breakfast., Disp: , Rfl:  .  glipiZIDE (GLUCOTROL XL) 10 MG 24 hr tablet, Take 10 mg by mouth daily with breakfast., Disp: , Rfl:  .  loratadine (CLARITIN) 10 MG tablet, Take 10 mg by mouth daily. , Disp: , Rfl:  .  Magnesium 500 MG TABS, Take 1,000 mg by mouth daily., Disp: , Rfl:  .  meclizine (ANTIVERT) 25 MG tablet, Take 25 mg by mouth 2 (two) times daily as needed for dizziness. , Disp: , Rfl:  .  oxyCODONE-acetaminophen (PERCOCET) 5-325 MG tablet, Take 1-2 tablets by mouth every 4 (four) hours as needed for moderate pain or severe pain., Disp: 20 tablet, Rfl: 0 .  telmisartan (MICARDIS) 80 MG tablet, Take 40 mg by mouth every evening. , Disp: , Rfl:  .  warfarin (COUMADIN) 5 MG  tablet, Take 5 mg by mouth every evening. Take 1.5 tablets (7.5 mg) by mouth on Saturdays in the evening & take 1 tablet (5 mg) by mouth on Sundays, Mondays, Tuesdays, Wednesdays, Thursdays & Fridays., Disp: , Rfl:   Physical exam:  Vitals:   08/06/20 1028  BP: 137/86  Pulse: 71  Resp: 20  SpO2: 97%  Weight: 226 lb 9.6 oz (102.8 kg)   Physical Exam HENT:     Head: Normocephalic and atraumatic.  Eyes:     Extraocular Movements: EOM normal.     Pupils: Pupils are equal,  round, and reactive to light.  Cardiovascular:     Rate and Rhythm: Normal rate and regular rhythm.     Heart sounds: Normal heart sounds.  Pulmonary:     Effort: Pulmonary effort is normal.     Breath sounds: Normal breath sounds.  Abdominal:     General: Bowel sounds are normal.     Palpations: Abdomen is soft.  Musculoskeletal:     Cervical back: Normal range of motion.  Skin:    General: Skin is warm and dry.  Neurological:     Mental Status: He is alert and oriented to person, place, and time.      CMP Latest Ref Rng & Units 07/19/2020  Glucose 70 - 99 mg/dL 188(H)  BUN 8 - 23 mg/dL 17  Creatinine 0.61 - 1.24 mg/dL 1.39(H)  Sodium 135 - 145 mmol/L 137  Potassium 3.5 - 5.1 mmol/L 3.9  Chloride 98 - 111 mmol/L 102  CO2 22 - 32 mmol/L 24  Calcium 8.9 - 10.3 mg/dL 9.1  Total Protein 6.5 - 8.1 g/dL 6.7  Total Bilirubin 0.3 - 1.2 mg/dL 0.6  Alkaline Phos 38 - 126 U/L 48  AST 15 - 41 U/L 37  ALT 0 - 44 U/L 47(H)   CBC Latest Ref Rng & Units 07/27/2020  WBC 4.0 - 10.5 K/uL 6.6  Hemoglobin 13.0 - 17.0 g/dL 14.7  Hematocrit 39.0 - 52.0 % 44.6  Platelets 150 - 400 K/uL 231    No images are attached to the encounter.  NM PET Image Initial (PI) Skull Base To Thigh  Result Date: 08/02/2020 CLINICAL DATA:  Initial treatment strategy for lymphoma. EXAM: NUCLEAR MEDICINE PET SKULL BASE TO THIGH TECHNIQUE: 12.1 mCi F-18 FDG was injected intravenously. Full-ring PET imaging was performed from the skull  base to thigh after the radiotracer. CT data was obtained and used for attenuation correction and anatomic localization. Fasting blood glucose: 127 mg/dl COMPARISON:  CT abdomen/pelvis dated 06/21/2020 FINDINGS: Mediastinal blood pool activity: SUV max 2.9 Liver activity: SUV max NA NECK: No hypermetabolic cervical lymphadenopathy. Incidental CT findings: none CHEST: No hypermetabolic thoracic lymphadenopathy. No suspicious pulmonary nodules. Right chest port terminates the cavoatrial junction. Incidental CT findings: Very mild atherosclerotic calcifications of the aortic root. 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 Max SUV 13.4, although this is difficult to distinguish from the adjacent excretory activity within the right renal collecting system and ureter. No hypermetabolic abdominopelvic lymphadenopathy. Spleen is normal in size, without focal lesion. Reference splenic activity demonstrates max SUV 4.0. No abnormal hypermetabolism in the liver, pancreas, or adrenal glands. Incidental CT findings: Bilateral renal cysts. Atherosclerotic calcifications of the abdominal aorta and branch vessels. Mildly thick-walled bladder, although underdistended. Tiny fat containing bilateral inguinal hernias. SKELETON: No focal hypermetabolic activity to suggest skeletal metastasis. Incidental CT findings: Mild degenerative changes of the visualized thoracolumbar spine. 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 No suspicious lymphadenopathy in the neck, chest, abdomen, or pelvis. Spleen is normal in size. Electronically Signed   By: Julian Hy M.D.   On: 08/02/2020 14:05   US Venous Img Lower Unilateral Right  Result Date: 07/08/2020 CLINICAL DATA:  History of DVT.  Right lower extremity pain EXAM: RIGHT LOWER EXTREMITY VENOUS  DOPPLER ULTRASOUND TECHNIQUE: Gray-scale sonography with compression, as well as color and duplex ultrasound, were performed to evaluate the  deep venous system(s) from the level of the common femoral vein through the popliteal and proximal calf veins. COMPARISON:  None. FINDINGS: VENOUS Normal compressibility of the common femoral, superficial femoral, and popliteal veins, as well as the visualized calf veins. Visualized portions of profunda femoral vein and great saphenous vein unremarkable. No filling defects to suggest DVT on grayscale or color Doppler imaging. Doppler waveforms show normal direction of venous flow, normal respiratory plasticity and response to augmentation. Limited views of the contralateral common femoral vein are unremarkable. OTHER None. Limitations: none IMPRESSION: Negative. Electronically Signed   By: Kerby Moors M.D.   On: 07/08/2020 09:23   CT BONE MARROW BIOPSY & ASPIRATION  Result Date: 07/27/2020 INDICATION: History of lymphoma. Please perform CT-guided bone marrow biopsy for tissue diagnostic purposes. EXAM: CT-GUIDED BONE MARROW BIOPSY AND ASPIRATION MEDICATIONS: None ANESTHESIA/SEDATION: Fentanyl 50 mcg IV; Versed 1 mg IV Sedation Time: 10 Minutes; The patient was continuously monitored during the procedure by the interventional radiology nurse under my direct supervision. COMPLICATIONS: None immediate. PROCEDURE: Informed consent was obtained from the patient following an explanation of the procedure, risks, benefits and alternatives. The patient understands, agrees and consents for the procedure. All questions were addressed. A time out was performed prior to the initiation of the procedure. The patient was positioned prone and non-contrast localization CT was performed of the pelvis to demonstrate the iliac marrow spaces. The operative site was prepped and draped in the usual sterile fashion. Under sterile conditions and local anesthesia, a 22 gauge spinal needle was  utilized for procedural planning. Next, an 11 gauge coaxial bone biopsy needle was advanced into the left iliac marrow space. Needle position was confirmed with CT imaging. Initially, a bone marrow aspiration was performed. Next, a bone marrow biopsy was obtained with the 11 gauge outer bone marrow device. The needle was removed and superficial hemostasis was obtained with manual compression. A dressing was applied. The patient tolerated the procedure well without immediate post procedural complication. IMPRESSION: Successful CT guided left iliac bone marrow aspiration and core biopsy. Electronically Signed   By: Sandi Mariscal M.D.   On: 07/27/2020 09:46   ECHOCARDIOGRAM COMPLETE  Result Date: 07/26/2020    ECHOCARDIOGRAM REPORT   Patient Name:   ELWIN TSOU [facility] Date of Exam: 07/26/2020 Medical Rec #:  [id number]      Height:       74.0 in Accession #:    [id number]     Weight:       225.3 lb Date of Birth:  [birth year]       BSA:          2.286 m Patient Age:    70 years       BP:           136/78 mmHg Patient Gender: M              HR:           76 bpm. Exam Location:  [facility] Procedure: 2D Echo, Cardiac Doppler, Color Doppler and Strain Analysis Indications:     Chemo V 67.2  History:         Patient has no prior history of Echocardiogram examinations.                  Risk Factors:Diabetes. DVT.  Sonographer:     Sherrie Sport RDCS (AE) Referring Phys:  [id number] Weston Anna Zyire Eidson Diagnosing Phys: Ida Rogue MD  Sonographer Comments: Global longitudinal strain was attempted. IMPRESSIONS  1. Left ventricular ejection fraction, by estimation, is 60 to 65%. The left ventricle has normal function. The left ventricle has no regional wall motion abnormalities. Left ventricular diastolic parameters are consistent with Grade I diastolic dysfunction (impaired relaxation). The average left ventricular global longitudinal strain is -16.8 %. The global longitudinal strain is normal.  2. Right ventricular systolic function is normal.  The right ventricular size is normal. FINDINGS  Left Ventricle: Left ventricular ejection fraction, by estimation, is 60 to 65%. The left ventricle has normal function. The left ventricle has no regional wall motion abnormalities. The average left ventricular global longitudinal strain is -16.8 %. The global longitudinal strain is normal. The left ventricular internal cavity size was normal in size. There is no left ventricular hypertrophy. Left ventricular diastolic parameters are consistent with Grade I diastolic dysfunction (impaired relaxation). Right Ventricle: The right ventricular size is normal. No increase in right ventricular wall thickness. Right ventricular systolic function is normal. Left Atrium: Left atrial size was normal in size. Right Atrium: Right atrial size was normal in size. Pericardium: There is no evidence of pericardial effusion. Mitral Valve: The mitral valve is normal in structure. Mild mitral valve regurgitation. No evidence of mitral valve stenosis. Tricuspid Valve: The tricuspid valve is normal in structure. Tricuspid valve regurgitation is not demonstrated. No evidence of tricuspid stenosis. Aortic Valve: The aortic valve is normal in structure. Aortic valve regurgitation is not visualized. No aortic stenosis is present. Aortic valve mean gradient measures 3.0 mmHg. Aortic valve peak gradient measures 4.2 mmHg. Aortic valve area, by VTI measures 2.89 cm. Pulmonic Valve: The pulmonic valve was normal in structure. Pulmonic valve regurgitation is not visualized. No evidence of pulmonic stenosis. Aorta: The aortic root is normal in size and structure. Venous: The inferior vena cava is normal in size with greater than 50% respiratory variability, suggesting right atrial pressure of 3 mmHg. IAS/Shunts: No atrial level shunt detected by color flow Doppler.  LEFT VENTRICLE PLAX 2D LVIDd:         4.48 cm  Diastology LVIDs:         2.44 cm  LV e' medial:    5.33 cm/s LV PW:         0.97 cm   LV E/e' medial:  12.9 LV IVS:        0.95 cm  LV e' lateral:   11.20 cm/s LVOT diam:     2.00 cm  LV E/e' lateral: 6.1 LV SV:         58 LV SV Index:   25       2D Longitudinal Strain LVOT Area:     3.14 cm 2D Strain GLS (A2C):   -16.4 %                         2D Strain GLS (A3C):   -16.8 %                         2D Strain GLS (A4C):   -17.4 %                         2D Strain GLS Avg:     -16.8 %                          3D Volume EF:  3D EF:        52 %                         LV EDV:       107 ml                         LV ESV:       51 ml                         LV SV:        55 ml RIGHT VENTRICLE RV Basal diam:  1.74 cm RV S prime:     10.20 cm/s TAPSE (M-mode): 3.8 cm LEFT ATRIUM             Index       RIGHT ATRIUM           Index LA diam:        2.70 cm 1.18 cm/m  RA Area:     17.10 cm LA Vol (A2C):   39.9 ml 17.45 ml/m RA Volume:   48.20 ml  21.08 ml/m LA Vol (A4C):   22.4 ml 9.80 ml/m LA Biplane Vol: 32.0 ml 14.00 ml/m  AORTIC VALVE                   PULMONIC VALVE AV Area (Vmax):    2.39 cm    PV Vmax:        0.75 m/s AV Area (Vmean):   2.32 cm    PV Peak grad:   2.2 mmHg AV Area (VTI):     2.89 cm    RVOT Peak grad: 3 mmHg AV Vmax:           102.00 cm/s AV Vmean:          74.800 cm/s AV VTI:            0.201 m AV Peak Grad:      4.2 mmHg AV Mean Grad:      3.0 mmHg LVOT Vmax:         77.70 cm/s LVOT Vmean:        55.300 cm/s LVOT VTI:          0.185 m LVOT/AV VTI ratio: 0.92  AORTA Ao Root diam: 3.50 cm MITRAL VALVE               TRICUSPID VALVE MV Area (PHT): 2.62 cm    TR Peak grad:   15.2 mmHg MV Decel Time: 290 msec    TR Vmax:        195.00 cm/s MV E velocity: 68.60 cm/s MV A velocity: 59.60 cm/s  SHUNTS MV E/A ratio:  1.15        Systemic VTI:  0.18 m                            Systemic Diam: 2.00 cm Ida Rogue MD Electronically signed by Ida Rogue MD Signature Date/Time: 07/26/2020/4:34:22 PM    Final    IR IMAGING GUIDED PORT INSERTION  Result  Date: 07/27/2020 INDICATION: History of lymphoma. In need of durable intravenous access for chemotherapy administration. EXAM: IMPLANTED PORT A CATH PLACEMENT WITH ULTRASOUND AND FLUOROSCOPIC GUIDANCE COMPARISON:  None. MEDICATIONS: Ancef 2 gm IV; The antibiotic was administered within an appropriate time interval prior to skin puncture.  ANESTHESIA/SEDATION: Moderate (conscious) sedation was employed during this procedure. A total of Versed 2 mg and Fentanyl 100 mcg was administered intravenously. Moderate Sedation Time: 16 minutes. The patient's level of consciousness and vital signs were monitored continuously by radiology nursing throughout the procedure under my direct supervision. CONTRAST:  None FLUOROSCOPY TIME:  18 seconds (3.2 mGy) COMPLICATIONS: None immediate. PROCEDURE: The procedure, risks, benefits, and alternatives were explained to the patient. Questions regarding the procedure were encouraged and answered. The patient understands and consents to the procedure. The right neck and chest were prepped with chlorhexidine in a sterile fashion, and a sterile drape was applied covering the operative field. Maximum barrier sterile technique with sterile gowns and gloves were used for the procedure. A timeout was performed prior to the initiation of the procedure. Local anesthesia was provided with 1% lidocaine with epinephrine. After creating a small venotomy incision, a micropuncture kit was utilized to access the internal jugular vein. Real-time ultrasound guidance was utilized for vascular access including the acquisition of a permanent ultrasound image documenting patency of the accessed vessel. The microwire was utilized to measure appropriate catheter length. A subcutaneous port pocket was then created along the upper chest wall utilizing a combination of sharp and blunt dissection. The pocket was irrigated with sterile saline. A single lumen "standard sized" power injectable port was chosen for  placement. The 8 Fr catheter was tunneled from the port pocket site to the venotomy incision. The port was placed in the pocket. The external catheter was trimmed to appropriate length. At the venotomy, an 8 Fr peel-away sheath was placed over a guidewire under fluoroscopic guidance. The catheter was then placed through the sheath and the sheath was removed. Final catheter positioning was confirmed and documented with a fluoroscopic spot radiograph. The port was accessed with a Huber needle, aspirated and flushed with heparinized saline. The venotomy site was closed with an interrupted 4-0 Vicryl suture. The port pocket incision was closed with interrupted 2-0 Vicryl suture. Dermabond and Steri-strips were applied to both incisions. Dressings were applied. The patient tolerated the procedure well without immediate post procedural complication. FINDINGS: After catheter placement, the tip lies within the superior cavoatrial junction. The catheter aspirates and flushes normally and is ready for immediate use. IMPRESSION: Successful placement of a right internal jugular approach power injectable Port-A-Cath. The catheter is ready for immediate use. Electronically Signed   By: Sandi Mariscal M.D.   On: 07/27/2020 10:28     Assessment and plan- Patient is a 64 y.o. 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  There was no other hypermetabolic abdominopelvic adenopathy noted.  No hypermetabolism noted in the liver or spleen or other organs.  No intrathoracic adenopathy.  No hypermetabolic activity noted in the bones.  This is consistent with stage I E disease.  We are currently awaiting FISH studies to determine if this is a double hit lymphoma or not.   Patient's IPI score for lymphoma is 1 given his age is more than 18 years.  Patient would at least need 3 cycles of R-CHOP chemotherapy for his disease but potentially 6 if this area cannot be safely radiated.  I will be discussing his case at tumor board with radiation oncology later this week.  Discussed risks and benefits of R-CHOP chemotherapy including all but not limited to nausea, vomiting, low blood counts, risk of infections and hospitalizations.  Risk of peripheral neuropathy associated with vincristine and cardiotoxicity associated with anthracyclines.  Treatment will be given with growth factor support with a curative intent.  Plan is to repeat PET scan after 3 cycles.  Patient understands and agrees to proceed as planned.  If FISH studies are consistent with a double hit lymphoma I will plan to add etoposide to his regimen as well.  Patient already had a baseline echocardiogram which showed a normal EF of 60 to 65%.  He has port in place.  He will get chemotherapy teach this week and I will plan to start chemotherapy next week R-CHOP cycle 1.  He will be taking prednisone 100 mg day 1 to day 5.  Patient will directly proceed for cycle 1 of chemotherapy next week and I will tentatively see him 10 days after chemotherapy to see how he tolerates treatment.     Visit Diagnosis 1. Goals of care, counseling/discussion   2. Diffuse large B-cell lymphoma of extranodal site excluding spleen and other solid organs ([place])   3. Encounter for antineoplastic chemotherapy   4. Encounter for monitoring rituximab therapy      Dr. Randa Evens, MD, MPH [facility] [phone] 08/06/2020 3:13 PM

## 2020-08-06 NOTE — Patient Instructions (Signed)
Rituximab injection What is this medicine? RITUXIMAB (ri TUX i mab) is a monoclonal antibody. It is used to treat certain types of cancer like non-Hodgkin lymphoma and chronic lymphocytic leukemia. It is also used to treat rheumatoid arthritis, granulomatosis with polyangiitis (or Wegener's granulomatosis), microscopic polyangiitis, and pemphigus vulgaris. This medicine may be used for other purposes; ask your health care provider or pharmacist if you have questions. COMMON BRAND NAME(S): Rituxan, RUXIENCE What should I tell my health care provider before I take this medicine? They need to know if you have any of these conditions:  heart disease  infection (especially a virus infection such as hepatitis B, chickenpox, cold sores, or herpes)  immune system problems  irregular heartbeat  kidney disease  low blood counts, like low white cell, platelet, or red cell counts  lung or breathing disease, like asthma  recently received or scheduled to receive a vaccine  an unusual or allergic reaction to rituximab, other medicines, foods, dyes, or preservatives  pregnant or trying to get pregnant  breast-feeding How should I use this medicine? This medicine is for infusion into a vein. It is administered in a hospital or clinic by a specially trained health care professional. A special MedGuide will be given to you by the pharmacist with each prescription and refill. Be sure to read this information carefully each time. Talk to your pediatrician regarding the use of this medicine in children. This medicine is not approved for use in children. Overdosage: If you think you have taken too much of this medicine contact a poison control center or emergency room at once. NOTE: This medicine is only for you. Do not share this medicine with others. What if I miss a dose? It is important not to miss a dose. Call your doctor or health care professional if you are unable to keep an appointment. What  may interact with this medicine?  cisplatin  live virus vaccines This list may not describe all possible interactions. Give your health care provider a list of all the medicines, herbs, non-prescription drugs, or dietary supplements you use. Also tell them if you smoke, drink alcohol, or use illegal drugs. Some items may interact with your medicine. What should I watch for while using this medicine? Your condition will be monitored carefully while you are receiving this medicine. You may need blood work done while you are taking this medicine. This medicine can cause serious allergic reactions. To reduce your risk you may need to take medicine before treatment with this medicine. Take your medicine as directed. In some patients, this medicine may cause a serious brain infection that may cause death. If you have any problems seeing, thinking, speaking, walking, or standing, tell your healthcare professional right away. If you cannot reach your healthcare professional, urgently seek other source of medical care. Call your doctor or health care professional for advice if you get a fever, chills or sore throat, or other symptoms of a cold or flu. Do not treat yourself. This drug decreases your body's ability to fight infections. Try to avoid being around people who are sick. Do not become pregnant while taking this medicine or for at least 12 months after stopping it. Women should inform their doctor if they wish to become pregnant or think they might be pregnant. There is a potential for serious side effects to an unborn child. Talk to your health care professional or pharmacist for more information. Do not breast-feed an infant while taking this medicine or for at   least 6 months after stopping it. What side effects may I notice from receiving this medicine? Side effects that you should report to your doctor or health care professional as soon as possible:  allergic reactions like skin rash, itching or  hives; swelling of the face, lips, or tongue  breathing problems  chest pain  changes in vision  diarrhea  headache with fever, neck stiffness, sensitivity to light, nausea, or confusion  fast, irregular heartbeat  loss of memory  low blood counts - this medicine may decrease the number of white blood cells, red blood cells and platelets. You may be at increased risk for infections and bleeding.  mouth sores  problems with balance, talking, or walking  redness, blistering, peeling or loosening of the skin, including inside the mouth  signs of infection - fever or chills, cough, sore throat, pain or difficulty passing urine  signs and symptoms of kidney injury like trouble passing urine or change in the amount of urine  signs and symptoms of liver injury like dark yellow or brown urine; general ill feeling or flu-like symptoms; light-colored stools; loss of appetite; nausea; right upper belly pain; unusually weak or tired; yellowing of the eyes or skin  signs and symptoms of low blood pressure like dizziness; feeling faint or lightheaded, falls; unusually weak or tired  stomach pain  swelling of the ankles, feet, hands  unusual bleeding or bruising  vomiting Side effects that usually do not require medical attention (report to your doctor or health care professional if they continue or are bothersome):  headache  joint pain  muscle cramps or muscle pain  nausea  tiredness This list may not describe all possible side effects. Call your doctor for medical advice about side effects. You may report side effects to FDA at 1-800-FDA-1088. Where should I keep my medicine? This drug is given in a hospital or clinic and will not be stored at home. NOTE: This sheet is a summary. It may not cover all possible information. If you have questions about this medicine, talk to your doctor, pharmacist, or health care provider.  2020 Elsevier/Gold Standard (2018-09-08  22:01:36) Doxorubicin injection What is this medicine? DOXORUBICIN (dox oh ROO bi sin) is a chemotherapy drug. It is used to treat many kinds of cancer like leukemia, lymphoma, neuroblastoma, sarcoma, and Wilms' tumor. It is also used to treat bladder cancer, breast cancer, lung cancer, ovarian cancer, stomach cancer, and thyroid cancer. This medicine may be used for other purposes; ask your health care provider or pharmacist if you have questions. COMMON BRAND NAME(S): Adriamycin, Adriamycin PFS, Adriamycin RDF, Rubex What should I tell my health care provider before I take this medicine? They need to know if you have any of these conditions:  heart disease  history of low blood counts caused by a medicine  liver disease  recent or ongoing radiation therapy  an unusual or allergic reaction to doxorubicin, other chemotherapy agents, other medicines, foods, dyes, or preservatives  pregnant or trying to get pregnant  breast-feeding How should I use this medicine? This drug is given as an infusion into a vein. It is administered in a hospital or clinic by a specially trained health care professional. If you have pain, swelling, burning or any unusual feeling around the site of your injection, tell your health care professional right away. Talk to your pediatrician regarding the use of this medicine in children. Special care may be needed. Overdosage: If you think you have taken too much of  this medicine contact a poison control center or emergency room at once. NOTE: This medicine is only for you. Do not share this medicine with others. What if I miss a dose? It is important not to miss your dose. Call your doctor or health care professional if you are unable to keep an appointment. What may interact with this medicine? This medicine may interact with the following medications:  6-mercaptopurine  paclitaxel  phenytoin  St. John's Wort  trastuzumab  verapamil This list may not  describe all possible interactions. Give your health care provider a list of all the medicines, herbs, non-prescription drugs, or dietary supplements you use. Also tell them if you smoke, drink alcohol, or use illegal drugs. Some items may interact with your medicine. What should I watch for while using this medicine? This drug may make you feel generally unwell. This is not uncommon, as chemotherapy can affect healthy cells as well as cancer cells. Report any side effects. Continue your course of treatment even though you feel ill unless your doctor tells you to stop. There is a maximum amount of this medicine you should receive throughout your life. The amount depends on the medical condition being treated and your overall health. Your doctor will watch how much of this medicine you receive in your lifetime. Tell your doctor if you have taken this medicine before. You may need blood work done while you are taking this medicine. Your urine may turn red for a few days after your dose. This is not blood. If your urine is dark or brown, call your doctor. In some cases, you may be given additional medicines to help with side effects. Follow all directions for their use. Call your doctor or health care professional for advice if you get a fever, chills or sore throat, or other symptoms of a cold or flu. Do not treat yourself. This drug decreases your body's ability to fight infections. Try to avoid being around people who are sick. This medicine may increase your risk to bruise or bleed. Call your doctor or health care professional if you notice any unusual bleeding. Talk to your doctor about your risk of cancer. You may be more at risk for certain types of cancers if you take this medicine. Do not become pregnant while taking this medicine or for 6 months after stopping it. Women should inform their doctor if they wish to become pregnant or think they might be pregnant. Men should not father a child while  taking this medicine and for 6 months after stopping it. There is a potential for serious side effects to an unborn child. Talk to your health care professional or pharmacist for more information. Do not breast-feed an infant while taking this medicine. This medicine has caused ovarian failure in some women and reduced sperm counts in some men This medicine may interfere with the ability to have a child. Talk with your doctor or health care professional if you are concerned about your fertility. This medicine may cause a decrease in Co-Enzyme Q-10. You should make sure that you get enough Co-Enzyme Q-10 while you are taking this medicine. Discuss the foods you eat and the vitamins you take with your health care professional. What side effects may I notice from receiving this medicine? Side effects that you should report to your doctor or health care professional as soon as possible:  allergic reactions like skin rash, itching or hives, swelling of the face, lips, or tongue  breathing problems  chest  pain  fast or irregular heartbeat  low blood counts - this medicine may decrease the number of white blood cells, red blood cells and platelets. You may be at increased risk for infections and bleeding.  pain, redness, or irritation at site where injected  signs of infection - fever or chills, cough, sore throat, pain or difficulty passing urine  signs of decreased platelets or bleeding - bruising, pinpoint red spots on the skin, black, tarry stools, blood in the urine  swelling of the ankles, feet, hands  tiredness  weakness Side effects that usually do not require medical attention (report to your doctor or health care professional if they continue or are bothersome):  diarrhea  hair loss  mouth sores  nail discoloration or damage  nausea  red colored urine  vomiting This list may not describe all possible side effects. Call your doctor for medical advice about side effects.  You may report side effects to FDA at 1-800-FDA-1088. Where should I keep my medicine? This drug is given in a hospital or clinic and will not be stored at home. NOTE: This sheet is a summary. It may not cover all possible information. If you have questions about this medicine, talk to your doctor, pharmacist, or health care provider.  2020 Elsevier/Gold Standard (2017-03-11 11:01:26) Vincristine injection What is this medicine? VINCRISTINE (vin KRIS teen) is a chemotherapy drug. It slows the growth of cancer cells. This medicine is used to treat many types of cancer like Hodgkin's disease, leukemia, non-Hodgkin's lymphoma, neuroblastoma (brain cancer), rhabdomyosarcoma, and Wilms' tumor. This medicine may be used for other purposes; ask your health care provider or pharmacist if you have questions. COMMON BRAND NAME(S): Oncovin, Vincasar PFS What should I tell my health care provider before I take this medicine? They need to know if you have any of these conditions:  blood disorders  gout  infection (especially chickenpox, cold sores, or herpes)  kidney disease  liver disease  lung disease  nervous system disease like Charcot-Marie-Tooth (CMT)  recent or ongoing radiation therapy  an unusual or allergic reaction to vincristine, other chemotherapy agents, other medicines, foods, dyes, or preservatives  pregnant or trying to get pregnant  breast-feeding How should I use this medicine? This drug is given as an infusion into a vein. It is administered in a hospital or clinic by a specially trained health care professional. If you have pain, swelling, burning, or any unusual feeling around the site of your injection, tell your health care professional right away. Talk to your pediatrician regarding the use of this medicine in children. While this drug may be prescribed for selected conditions, precautions do apply. Overdosage: If you think you have taken too much of this medicine  contact a poison control center or emergency room at once. NOTE: This medicine is only for you. Do not share this medicine with others. What if I miss a dose? It is important not to miss your dose. Call your doctor or health care professional if you are unable to keep an appointment. What may interact with this medicine? Do not take this medicine with any of the following medications:  itraconazole  mibefradil  voriconazole This medicine may also interact with the following medications:  cyclosporine  erythromycin  fluconazole  ketoconazole  medicines for HIV like delavirdine, efavirenz, nevirapine  medicines for seizures like ethotoin, fosphenotoin, phenytoin  medicines to increase blood counts like filgrastim, pegfilgrastim, sargramostim  other chemotherapy drugs like cisplatin, L-asparaginase, methotrexate, mitomycin, paclitaxel  pegaspargase  vaccines  zalcitabine, ddC Talk to your doctor or health care professional before taking any of these medicines:  acetaminophen  aspirin  ibuprofen  ketoprofen  naproxen This list may not describe all possible interactions. Give your health care provider a list of all the medicines, herbs, non-prescription drugs, or dietary supplements you use. Also tell them if you smoke, drink alcohol, or use illegal drugs. Some items may interact with your medicine. What should I watch for while using this medicine? This drug may make you feel generally unwell. This is not uncommon, as chemotherapy can affect healthy cells as well as cancer cells. Report any side effects. Continue your course of treatment even though you feel ill unless your doctor tells you to stop. You may need blood work done while you are taking this medicine. This medicine will cause constipation. Try to have a bowel movement at least every 2 to 3 days. If you do not have a bowel movement for 3 days, call your doctor or health care professional. In some cases, you  may be given additional medicines to help with side effects. Follow all directions for their use. Do not become pregnant while taking this medicine. Women should inform their doctor if they wish to become pregnant or think they might be pregnant. There is a potential for serious side effects to an unborn child. Talk to your health care professional or pharmacist for more information. Do not breast-feed an infant while taking this medicine. This medicine may make it more difficult to get pregnant or to father a child. Talk to your healthcare professional if you are concerned about your fertility. What side effects may I notice from receiving this medicine? Side effects that you should report to your doctor or health care professional as soon as possible:  allergic reactions like skin rash, itching or hives, swelling of the face, lips, or tongue  breathing problems  confusion or changes in emotions or moods  constipation  cough  mouth sores  muscle weakness  nausea and vomiting  pain, swelling, redness or irritation at the injection site  pain, tingling, numbness in the hands or feet  problems with balance, talking, walking  seizures  stomach pain  trouble passing urine or change in the amount of urine Side effects that usually do not require medical attention (report to your doctor or health care professional if they continue or are bothersome):  diarrhea  hair loss  jaw pain  loss of appetite This list may not describe all possible side effects. Call your doctor for medical advice about side effects. You may report side effects to FDA at 1-800-FDA-1088. Where should I keep my medicine? This drug is given in a hospital or clinic and will not be stored at home. NOTE: This sheet is a summary. It may not cover all possible information. If you have questions about this medicine, talk to your doctor, pharmacist, or health care provider.  2020 Elsevier/Gold Standard  (2018-04-30 08:55:02) Cyclophosphamide Injection What is this medicine? CYCLOPHOSPHAMIDE (sye kloe FOSS fa mide) is a chemotherapy drug. It slows the growth of cancer cells. This medicine is used to treat many types of cancer like lymphoma, myeloma, leukemia, breast cancer, and ovarian cancer, to name a few. This medicine may be used for other purposes; ask your health care provider or pharmacist if you have questions. COMMON BRAND NAME(S): Cytoxan, Neosar What should I tell my health care provider before I take this medicine? They need to know if you have any   of these conditions:  heart disease  history of irregular heartbeat  infection  kidney disease  liver disease  low blood counts, like white cells, platelets, or red blood cells  on hemodialysis  recent or ongoing radiation therapy  scarring or thickening of the lungs  trouble passing urine  an unusual or allergic reaction to cyclophosphamide, other medicines, foods, dyes, or preservatives  pregnant or trying to get pregnant  breast-feeding How should I use this medicine? This drug is usually given as an injection into a vein or muscle or by infusion into a vein. It is administered in a hospital or clinic by a specially trained health care professional. Talk to your pediatrician regarding the use of this medicine in children. Special care may be needed. Overdosage: If you think you have taken too much of this medicine contact a poison control center or emergency room at once. NOTE: This medicine is only for you. Do not share this medicine with others. What if I miss a dose? It is important not to miss your dose. Call your doctor or health care professional if you are unable to keep an appointment. What may interact with this medicine?  amphotericin B  azathioprine  certain antivirals for HIV or hepatitis  certain medicines for blood pressure, heart disease, irregular heart beat  certain medicines that treat or  prevent blood clots like warfarin  certain other medicines for cancer  cyclosporine  etanercept  indomethacin  medicines that relax muscles for surgery  medicines to increase blood counts  metronidazole This list may not describe all possible interactions. Give your health care provider a list of all the medicines, herbs, non-prescription drugs, or dietary supplements you use. Also tell them if you smoke, drink alcohol, or use illegal drugs. Some items may interact with your medicine. What should I watch for while using this medicine? Your condition will be monitored carefully while you are receiving this medicine. You may need blood work done while you are taking this medicine. Drink water or other fluids as directed. Urinate often, even at night. Some products may contain alcohol. Ask your health care professional if this medicine contains alcohol. Be sure to tell all health care professionals you are taking this medicine. Certain medicines, like metronidazole and disulfiram, can cause an unpleasant reaction when taken with alcohol. The reaction includes flushing, headache, nausea, vomiting, sweating, and increased thirst. The reaction can last from 30 minutes to several hours. Do not become pregnant while taking this medicine or for 1 year after stopping it. Women should inform their health care professional if they wish to become pregnant or think they might be pregnant. Men should not father a child while taking this medicine and for 4 months after stopping it. There is potential for serious side effects to an unborn child. Talk to your health care professional for more information. Do not breast-feed an infant while taking this medicine or for 1 week after stopping it. This medicine has caused ovarian failure in some women. This medicine may make it more difficult to get pregnant. Talk to your health care professional if you are concerned about your fertility. This medicine has caused  decreased sperm counts in some men. This may make it more difficult to father a child. Talk to your health care professional if you are concerned about your fertility. Call your health care professional for advice if you get a fever, chills, or sore throat, or other symptoms of a cold or flu. Do not treat yourself.   This medicine decreases your body's ability to fight infections. Try to avoid being around people who are sick. Avoid taking medicines that contain aspirin, acetaminophen, ibuprofen, naproxen, or ketoprofen unless instructed by your health care professional. These medicines may hide a fever. Talk to your health care professional about your risk of cancer. You may be more at risk for certain types of cancer if you take this medicine. If you are going to need surgery or other procedure, tell your health care professional that you are using this medicine. Be careful brushing or flossing your teeth or using a toothpick because you may get an infection or bleed more easily. If you have any dental work done, tell your dentist you are receiving this medicine. What side effects may I notice from receiving this medicine? Side effects that you should report to your doctor or health care professional as soon as possible:  allergic reactions like skin rash, itching or hives, swelling of the face, lips, or tongue  breathing problems  nausea, vomiting  signs and symptoms of bleeding such as bloody or black, tarry stools; red or dark brown urine; spitting up blood or brown material that looks like coffee grounds; red spots on the skin; unusual bruising or bleeding from the eyes, gums, or nose  signs and symptoms of heart failure like fast, irregular heartbeat, sudden weight gain; swelling of the ankles, feet, hands  signs and symptoms of infection like fever; chills; cough; sore throat; pain or trouble passing urine  signs and symptoms of kidney injury like trouble passing urine or change in the  amount of urine  signs and symptoms of liver injury like dark yellow or brown urine; general ill feeling or flu-like symptoms; light-colored stools; loss of appetite; nausea; right upper belly pain; unusually weak or tired; yellowing of the eyes or skin Side effects that usually do not require medical attention (report to your doctor or health care professional if they continue or are bothersome):  confusion  decreased hearing  diarrhea  facial flushing  hair loss  headache  loss of appetite  missed menstrual periods  signs and symptoms of low red blood cells or anemia such as unusually weak or tired; feeling faint or lightheaded; falls  skin discoloration This list may not describe all possible side effects. Call your doctor for medical advice about side effects. You may report side effects to FDA at 1-800-FDA-1088. Where should I keep my medicine? This drug is given in a hospital or clinic and will not be stored at home. NOTE: This sheet is a summary. It may not cover all possible information. If you have questions about this medicine, talk to your doctor, pharmacist, or health care provider.  2020 Elsevier/Gold Standard (2019-05-02 09:53:29)  

## 2020-08-07 ENCOUNTER — Inpatient Hospital Stay: Payer: Commercial Managed Care - PPO

## 2020-08-07 ENCOUNTER — Encounter (HOSPITAL_COMMUNITY): Payer: Self-pay | Admitting: Oncology

## 2020-08-13 ENCOUNTER — Encounter: Payer: Self-pay | Admitting: Oncology

## 2020-08-13 LAB — SURGICAL PATHOLOGY

## 2020-08-16 ENCOUNTER — Inpatient Hospital Stay: Payer: Commercial Managed Care - PPO

## 2020-08-17 ENCOUNTER — Other Ambulatory Visit: Payer: Self-pay | Admitting: Oncology

## 2020-08-19 ENCOUNTER — Encounter: Payer: Self-pay | Admitting: *Deleted

## 2020-08-19 ENCOUNTER — Telehealth: Payer: Self-pay | Admitting: *Deleted

## 2020-08-19 NOTE — Telephone Encounter (Signed)
I called and got pt's voicemail and let him know that he had an appt. Scheduled for 1/10. The appt. Was to see how he is doing after he has his first chemo. Due to insurance we were not able to give him chemo last week so there is no need for the appt. 1/10 and we have cancelled it. I also sent him a my chart message. We have scheduled his first chemo for 1/14 if the insurance has approved it. I asked him to call me back for any questions or can send my chart. I left my direct number and I will update him on status of chemo when I check on  Monday.

## 2020-08-19 NOTE — Progress Notes (Deleted)
Hematology/Oncology Consult note [facility]  [phone] [phone]  Patient Care Team: Rusty Aus, MD as PCP - General (Internal Medicine) Sindy Guadeloupe, MD as Consulting Physician (Oncology)   Name of the patient: Eddie Ryan  [id number]  07/14/1956   Date of visit: 08/19/20  Diagnosis- ***  Chief complaint/ Reason for visit- ***  Heme/Onc history: ***  Interval history- ***  ECOG PS- *** Pain scale- *** Opioid associated constipation- ***  Review of systems- ROS   Current treatment- ***  Allergies  Allergen Reactions  . Losartan Potassium-Hctz Other (See Comments)    Unknown reaction.  . Morphine And Related Itching    WITH PROLONGED USE     Past Medical History:  Diagnosis Date  . Anxiety   . Collagen vascular disease ([place])   . Depression   . Diabetes mellitus without complication ([place])   . DVT (deep venous thrombosis) ([place])   . History of kidney stones   . Hypertension   . Lymphoma ([place]) 07/2020  . Sleep apnea      Past Surgical History:  Procedure Laterality Date  . CERVICAL FUSION    . CYSTOSCOPY W/ RETROGRADES Left 04/26/2020   Procedure: CYSTOSCOPY WITH RETROGRADE PYELOGRAM;  Surgeon: Hollice Espy, MD;  Location: [facility] ORS;  Service: Urology;  Laterality: Left;  . CYSTOSCOPY WITH URETEROSCOPY AND STENT PLACEMENT Right 07/02/2020   Procedure: CYSTOSCOPY WITH URETEROSCOPY AND STENT PLACEMENT;  Surgeon: Hollice Espy, MD;  Location: [facility];  Service: Urology;  Laterality: Right;  . CYSTOSCOPY/URETEROSCOPY/HOLMIUM LASER/STENT PLACEMENT Right 04/26/2020   Procedure: CYSTOSCOPY/URETEROSCOPY/HOLMIUM LASER/STENT PLACEMENT;  Surgeon: Hollice Espy, MD;  Location: [facility] OR[facility];  Service: Urology;  Laterality: Right;  . IR IMAGING GUIDED PORT INSERTION  07/27/2020  . PLANTAR FASCIA SURGERY Bilateral   . ROTATOR CUFF REPAIR Left   . THUMB FUSION    . URETERAL BIOPSY Right 04/26/2020   Procedure:  URETERAL BIOPSY with ablation;  Surgeon: Hollice Espy, MD;  Location: [facility] ORS;  Service: Urology;  Laterality: Right;  . URETEROSCOPY WITH HOLMIUM LASER LITHOTRIPSY      Social History   Socioeconomic History  . Marital status: Single    Spouse name: Not on file  . Number of children: 0  . Years of education: Not on file  . 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 Smoking status: Former Smoker    Types: Cigarettes    Quit date: 1981    Years since quitting: 41.0  . Smokeless tobacco: Never Used  Vaping Use  . Vaping Use: Never used  Substance and Sexual Activity  . Alcohol use: Never  . Drug use: Never  . Sexual activity: Not Currently  Other Topics Concern  . Not on file  Social History Narrative   Lives by himself..   Social Determinants of Health   Financial Resource Strain: Not on file  Food Insecurity: Not on file  Transportation Needs: Not on file  Physical Activity: Not on file  Stress: Not on file  Social Connections: Not on file  Intimate Partner Violence: Not on file    Family History  Problem Relation Age of Onset  . Pancreatic cancer Father   . Prostate cancer Paternal Grandmother   . Leukemia Nephew      Current Outpatient Medications:  .  allopurinol (ZYLOPRIM) 300 MG tablet, Take 1 tablet (300 mg total) by mouth  daily., Disp: 30 tablet, Rfl: 3 .  ALPRAZolam (XANAX) 0.5 MG tablet, Take 0.5 mg by mouth in the morning and at bedtime. , Disp: , Rfl:  .  buPROPion (WELLBUTRIN XL) 150 MG 24 hr tablet, Take 300 mg by mouth daily. , Disp: , Rfl:  .  Cyanocobalamin (VITAMIN B-12 PO), Take 1 capsule by mouth daily. Pure Encapsulations B12 Folate Capsules, Disp: , Rfl:  .  diclofenac Sodium (VOLTAREN) 1 % GEL, Apply 1 g topically 4 (four) times daily as needed (thumb as needed for pain.). , Disp: , Rfl:  .  docusate sodium (COLACE) 100 MG capsule, Take 200 mg by  mouth daily., Disp: , Rfl:  .  ergocalciferol (VITAMIN D2) 1.25 MG (50000 UT) capsule, Take 50,000 Units by mouth every Monday. , Disp: , Rfl:  .  escitalopram (LEXAPRO) 20 MG tablet, Take 20 mg by mouth daily. , Disp: , Rfl:  .  etodolac (LODINE) 400 MG tablet, Take 400 mg by mouth daily with breakfast., Disp: , Rfl:  .  glipiZIDE (GLUCOTROL XL) 10 MG 24 hr tablet, Take 10 mg by mouth daily with breakfast., Disp: , Rfl:  .  lidocaine-prilocaine (EMLA) cream, Apply to affected area once, Disp: 30 g, Rfl: 3 .  loratadine (CLARITIN) 10 MG tablet, Take 10 mg by mouth daily. , Disp: , Rfl:  .  Magnesium 500 MG TABS, Take 1,000 mg by mouth daily., Disp: , Rfl:  .  meclizine (ANTIVERT) 25 MG tablet, Take 25 mg by mouth 2 (two) times daily as needed for dizziness. , Disp: , Rfl:  .  ondansetron (ZOFRAN) 8 MG tablet, Take 1 tablet (8 mg total) by mouth 2 (two) times daily as needed for refractory nausea / vomiting. Start on day 3 after cyclophosphamide chemotherapy., Disp: 30 tablet, Rfl: 1 .  oxyCODONE-acetaminophen (PERCOCET) 5-325 MG tablet, Take 1-2 tablets by mouth every 4 (four) hours as needed for moderate pain or severe pain., Disp: 20 tablet, Rfl: 0 .  predniSONE (DELTASONE) 20 MG tablet, Take 5 tablets (100 mg total) by mouth daily. Take with food on days 1-5 of chemotherapy., Disp: 25 tablet, Rfl: 5 .  prochlorperazine (COMPAZINE) 10 MG tablet, Take 1 tablet (10 mg total) by mouth every 6 (six) hours as needed (Nausea or vomiting)., Disp: 30 tablet, Rfl: 6 .  telmisartan (MICARDIS) 80 MG tablet, Take 40 mg by mouth every evening. , Disp: , Rfl:  .  warfarin (COUMADIN) 5 MG tablet, Take 5 mg by mouth every evening. Take 1.5 tablets (7.5 mg) by mouth on Saturdays in the evening & take 1 tablet (5 mg) by mouth on Sundays, Mondays, Tuesdays, Wednesdays, Thursdays & Fridays., Disp: , Rfl:   Physical exam: There were no vitals filed for this visit. Physical Exam   CMP Latest Ref Rng & Units 07/19/2020   Glucose 70 - 99 mg/dL 188(H)  BUN 8 - 23 mg/dL 17  Creatinine 0.61 - 1.24 mg/dL 1.39(H)  Sodium 135 - 145 mmol/L 137  Potassium 3.5 - 5.1 mmol/L 3.9  Chloride 98 - 111 mmol/L 102  CO2 22 - 32 mmol/L 24  Calcium 8.9 - 10.3 mg/dL 9.1  Total Protein 6.5 - 8.1 g/dL 6.7  Total Bilirubin 0.3 - 1.2 mg/dL 0.6  Alkaline Phos 38 - 126 U/L 48  AST 15 - 41 U/L 37  ALT 0 - 44 U/L 47(H)   CBC Latest Ref Rng & Units 07/27/2020  WBC 4.0 - 10.5 K/uL 6.6  Hemoglobin 13.0 -  17.0 g/dL 14.7  Hematocrit 39.0 - 52.0 % 44.6  Platelets 150 - 400 K/uL 231    No images are attached to the encounter.  NM PET Image Initial (PI) Skull Base To Thigh  Result Date: 08/02/2020 CLINICAL DATA:  Initial treatment strategy for lymphoma. EXAM: NUCLEAR MEDICINE PET SKULL BASE TO THIGH TECHNIQUE: 12.1 mCi F-18 FDG was injected intravenously. Full-ring PET imaging was performed from the skull base to thigh after the radiotracer. CT data was obtained and used for attenuation correction and anatomic localization. Fasting blood glucose: 127 mg/dl COMPARISON:  CT abdomen/pelvis dated 06/21/2020 FINDINGS: Mediastinal blood pool activity: SUV max 2.9 Liver activity: SUV max NA NECK: No hypermetabolic cervical lymphadenopathy. Incidental CT findings: none CHEST: No hypermetabolic thoracic lymphadenopathy. No suspicious pulmonary nodules. Right chest port terminates the cavoatrial junction. Incidental CT findings: Very mild atherosclerotic calcifications of the aortic root. 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 Max SUV 13.4, although this is difficult to distinguish from the adjacent excretory activity within the right renal collecting system and ureter. No hypermetabolic abdominopelvic lymphadenopathy. Spleen is normal in size, without focal lesion. Reference splenic activity demonstrates max SUV 4.0. No abnormal hypermetabolism in  the liver, pancreas, or adrenal glands. Incidental CT findings: Bilateral renal cysts. Atherosclerotic calcifications of the abdominal aorta and branch vessels. Mildly thick-walled bladder, although underdistended. Tiny fat containing bilateral inguinal hernias. SKELETON: No focal hypermetabolic activity to suggest skeletal metastasis. Incidental CT findings: Mild degenerative changes of the visualized thoracolumbar spine. 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 No suspicious lymphadenopathy in the neck, chest, abdomen, or pelvis. Spleen is normal in size. Electronically Signed   By: Julian Hy M.D.   On: 08/02/2020 14:05   CT BONE MARROW BIOPSY & ASPIRATION  Result Date: 07/27/2020 INDICATION: History of lymphoma. Please perform CT-guided bone marrow biopsy for tissue diagnostic purposes. EXAM: CT-GUIDED BONE MARROW BIOPSY AND ASPIRATION MEDICATIONS: None ANESTHESIA/SEDATION: Fentanyl 50 mcg IV; Versed 1 mg IV Sedation Time: 10 Minutes; The patient was continuously monitored during the procedure by the interventional radiology nurse under my direct supervision. COMPLICATIONS: None immediate. PROCEDURE: Informed consent was obtained from the patient following an explanation of the procedure, risks, benefits and alternatives. The patient understands, agrees and consents for the procedure. All questions were addressed. A time out was performed prior to the initiation of the procedure. The patient was positioned prone and non-contrast localization CT was performed of the pelvis to demonstrate the iliac marrow spaces. The operative site was prepped and draped in the usual sterile fashion. Under sterile conditions and local anesthesia, a 22 gauge spinal needle was utilized for procedural planning. Next, an 11 gauge coaxial bone biopsy needle was advanced into the left iliac marrow space. Needle position was confirmed  with CT imaging. Initially, a bone marrow aspiration was performed. Next, a bone marrow biopsy was obtained with the 11 gauge outer bone marrow device. The needle was removed and superficial hemostasis was obtained with manual compression. A dressing was applied. The patient tolerated the procedure well without immediate post procedural complication. IMPRESSION: Successful CT guided left iliac bone marrow aspiration and core biopsy. Electronically Signed   By: Sandi Mariscal M.D.   On: 07/27/2020 09:46   ECHOCARDIOGRAM COMPLETE  Result Date: 07/26/2020    ECHOCARDIOGRAM REPORT   Patient Name:   CHRISTINE SCHIEFELBEIN [facility] Date of  Exam: 07/26/2020 Medical Rec #:  [id number]      Height:       74.0 in Accession #:    [id number]     Weight:       225.3 lb Date of Birth:  1956-03-23       BSA:          2.286 m Patient Age:    41 years       BP:           136/78 mmHg Patient Gender: M              HR:           76 bpm. Exam Location:  [facility] Procedure: 2D Echo, Cardiac Doppler, Color Doppler and Strain Analysis Indications:     Chemo V 67.2  History:         Patient has no prior history of Echocardiogram examinations.                  Risk Factors:Diabetes. DVT.  Sonographer:     Sherrie Sport RDCS (AE) Referring Phys:  [id number] Weston Anna Edelmiro Innocent Diagnosing Phys: Ida Rogue MD  Sonographer Comments: Global longitudinal strain was attempted. IMPRESSIONS  1. Left ventricular ejection fraction, by estimation, is 60 to 65%. The left ventricle has normal function. The left ventricle has no regional wall motion abnormalities. Left ventricular diastolic parameters are consistent with Grade I diastolic dysfunction (impaired relaxation). The average left ventricular global longitudinal strain is -16.8 %. The global longitudinal strain is normal.  2. Right ventricular systolic function is normal. The right ventricular size is normal. FINDINGS  Left Ventricle: Left ventricular ejection fraction, by estimation, is 60 to 65%. The left ventricle has  normal function. The left ventricle has no regional wall motion abnormalities. The average left ventricular global longitudinal strain is -16.8 %. The global longitudinal strain is normal. The left ventricular internal cavity size was normal in size. There is no left ventricular hypertrophy. Left ventricular diastolic parameters are consistent with Grade I diastolic dysfunction (impaired relaxation). Right Ventricle: The right ventricular size is normal. No increase in right ventricular wall thickness. Right ventricular systolic function is normal. Left Atrium: Left atrial size was normal in size. Right Atrium: Right atrial size was normal in size. Pericardium: There is no evidence of pericardial effusion. Mitral Valve: The mitral valve is normal in structure. Mild mitral valve regurgitation. No evidence of mitral valve stenosis. Tricuspid Valve: The tricuspid valve is normal in structure. Tricuspid valve regurgitation is not demonstrated. No evidence of tricuspid stenosis. Aortic Valve: The aortic valve is normal in structure. Aortic valve regurgitation is not visualized. No aortic stenosis is present. Aortic valve mean gradient measures 3.0 mmHg. Aortic valve peak gradient measures 4.2 mmHg. Aortic valve area, by VTI measures 2.89 cm. Pulmonic Valve: The pulmonic valve was normal in structure. Pulmonic valve regurgitation is not visualized. No evidence of pulmonic stenosis. Aorta: The aortic root is normal in size and structure. Venous: The inferior vena cava is normal in size with greater than 50% respiratory variability, suggesting right atrial pressure of 3 mmHg. IAS/Shunts: No atrial level shunt detected by color flow Doppler.  LEFT VENTRICLE PLAX 2D LVIDd:         4.48 cm  Diastology LVIDs:         2.44 cm  LV e' medial:    5.33 cm/s LV PW:         0.97 cm  LV E/e' medial:  12.9  LV IVS:        0.95 cm  LV e' lateral:   11.20 cm/s LVOT diam:     2.00 cm  LV E/e' lateral: 6.1 LV SV:         58 LV SV Index:    25       2D Longitudinal Strain LVOT Area:     3.14 cm 2D Strain GLS (A2C):   -16.4 %                         2D Strain GLS (A3C):   -16.8 %                         2D Strain GLS (A4C):   -17.4 %                         2D Strain GLS Avg:     -16.8 %                          3D Volume EF:                         3D EF:        52 %                         LV EDV:       107 ml                         LV ESV:       51 ml                         LV SV:        55 ml RIGHT VENTRICLE RV Basal diam:  1.74 cm RV S prime:     10.20 cm/s TAPSE (M-mode): 3.8 cm LEFT ATRIUM             Index       RIGHT ATRIUM           Index LA diam:        2.70 cm 1.18 cm/m  RA Area:     17.10 cm LA Vol (A2C):   39.9 ml 17.45 ml/m RA Volume:   48.20 ml  21.08 ml/m LA Vol (A4C):   22.4 ml 9.80 ml/m LA Biplane Vol: 32.0 ml 14.00 ml/m  AORTIC VALVE                   PULMONIC VALVE AV Area (Vmax):    2.39 cm    PV Vmax:        0.75 m/s AV Area (Vmean):   2.32 cm    PV Peak grad:   2.2 mmHg AV Area (VTI):     2.89 cm    RVOT Peak grad: 3 mmHg AV Vmax:           102.00 cm/s AV Vmean:          74.800 cm/s AV VTI:            0.201 m AV Peak Grad:      4.2 mmHg AV Mean Grad:      3.0 mmHg LVOT Vmax:  77.70 cm/s LVOT Vmean:        55.300 cm/s LVOT VTI:          0.185 m LVOT/AV VTI ratio: 0.92  AORTA Ao Root diam: 3.50 cm MITRAL VALVE               TRICUSPID VALVE MV Area (PHT): 2.62 cm    TR Peak grad:   15.2 mmHg MV Decel Time: 290 msec    TR Vmax:        195.00 cm/s MV E velocity: 68.60 cm/s MV A velocity: 59.60 cm/s  SHUNTS MV E/A ratio:  1.15        Systemic VTI:  0.18 m                            Systemic Diam: 2.00 cm Ida Rogue MD Electronically signed by Ida Rogue MD Signature Date/Time: 07/26/2020/4:34:22 PM    Final    IR IMAGING GUIDED PORT INSERTION  Result Date: 07/27/2020 INDICATION: History of lymphoma. In need of durable intravenous access for chemotherapy administration. EXAM: IMPLANTED PORT A CATH  PLACEMENT WITH ULTRASOUND AND FLUOROSCOPIC GUIDANCE COMPARISON:  None. MEDICATIONS: Ancef 2 gm IV; The antibiotic was administered within an appropriate time interval prior to skin puncture. ANESTHESIA/SEDATION: Moderate (conscious) sedation was employed during this procedure. A total of Versed 2 mg and Fentanyl 100 mcg was administered intravenously. Moderate Sedation Time: 16 minutes. The patient's level of consciousness and vital signs were monitored continuously by radiology nursing throughout the procedure under my direct supervision. CONTRAST:  None FLUOROSCOPY TIME:  18 seconds (3.2 mGy) COMPLICATIONS: None immediate. PROCEDURE: The procedure, risks, benefits, and alternatives were explained to the patient. Questions regarding the procedure were encouraged and answered. The patient understands and consents to the procedure. The right neck and chest were prepped with chlorhexidine in a sterile fashion, and a sterile drape was applied covering the operative field. Maximum barrier sterile technique with sterile gowns and gloves were used for the procedure. A timeout was performed prior to the initiation of the procedure. Local anesthesia was provided with 1% lidocaine with epinephrine. After creating a small venotomy incision, a micropuncture kit was utilized to access the internal jugular vein. Real-time ultrasound guidance was utilized for vascular access including the acquisition of a permanent ultrasound image documenting patency of the accessed vessel. The microwire was utilized to measure appropriate catheter length. A subcutaneous port pocket was then created along the upper chest wall utilizing a combination of sharp and blunt dissection. The pocket was irrigated with sterile saline. A single lumen "standard sized" power injectable port was chosen for placement. The 8 Fr catheter was tunneled from the port pocket site to the venotomy incision. The port was placed in the pocket. The external catheter was  trimmed to appropriate length. At the venotomy, an 8 Fr peel-away sheath was placed over a guidewire under fluoroscopic guidance. The catheter was then placed through the sheath and the sheath was removed. Final catheter positioning was confirmed and documented with a fluoroscopic spot radiograph. The port was accessed with a Huber needle, aspirated and flushed with heparinized saline. The venotomy site was closed with an interrupted 4-0 Vicryl suture. The port pocket incision was closed with interrupted 2-0 Vicryl suture. Dermabond and Steri-strips were applied to both incisions. Dressings were applied. The patient tolerated the procedure well without immediate post procedural complication. FINDINGS: After catheter placement, the tip lies within the superior cavoatrial junction. The  catheter aspirates and flushes normally and is ready for immediate use. IMPRESSION: Successful placement of a right internal jugular approach power injectable Port-A-Cath. The catheter is ready for immediate use. Electronically Signed   By: Sandi Mariscal M.D.   On: 07/27/2020 10:28     Assessment and plan- Patient is a 65 y.o. male ***   Visit Diagnosis 1. Encounter for antineoplastic chemotherapy   2. Encounter for monitoring rituximab therapy   3. 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 Randa Evens, MD, MPH [facility] [phone] 08/19/2020 7:30 PM

## 2020-08-20 ENCOUNTER — Inpatient Hospital Stay: Payer: Commercial Managed Care - PPO | Admitting: Oncology

## 2020-08-20 ENCOUNTER — Inpatient Hospital Stay: Payer: Commercial Managed Care - PPO

## 2020-08-21 ENCOUNTER — Telehealth: Payer: Self-pay | Admitting: *Deleted

## 2020-08-21 NOTE — Telephone Encounter (Signed)
Called the pt to say that chemo was approved and pt appt will stay for 1/14 arrive at 8:15 for port labs and then see md and then will start chemo. Pt agreeable and will be here on that date and time

## 2020-08-24 ENCOUNTER — Inpatient Hospital Stay: Payer: Commercial Managed Care - PPO | Attending: Oncology

## 2020-08-24 ENCOUNTER — Inpatient Hospital Stay (HOSPITAL_BASED_OUTPATIENT_CLINIC_OR_DEPARTMENT_OTHER): Payer: Commercial Managed Care - PPO | Admitting: Oncology

## 2020-08-24 ENCOUNTER — Inpatient Hospital Stay: Payer: Commercial Managed Care - PPO

## 2020-08-24 VITALS — BP 116/79 | HR 78 | Temp 97.2°F | Resp 16 | Ht 74.0 in | Wt 224.7 lb

## 2020-08-24 VITALS — BP 108/68 | HR 72 | Temp 96.8°F

## 2020-08-24 DIAGNOSIS — Z800 Family history of malignant neoplasm of digestive organs: Secondary | ICD-10-CM | POA: Diagnosis not present

## 2020-08-24 DIAGNOSIS — Z806 Family history of leukemia: Secondary | ICD-10-CM | POA: Diagnosis not present

## 2020-08-24 DIAGNOSIS — D6861 Antiphospholipid syndrome: Secondary | ICD-10-CM | POA: Insufficient documentation

## 2020-08-24 DIAGNOSIS — C8339 Diffuse large B-cell lymphoma, extranodal and solid organ sites: Secondary | ICD-10-CM | POA: Diagnosis present

## 2020-08-24 DIAGNOSIS — Z79899 Other long term (current) drug therapy: Secondary | ICD-10-CM

## 2020-08-24 DIAGNOSIS — Z5111 Encounter for antineoplastic chemotherapy: Secondary | ICD-10-CM

## 2020-08-24 DIAGNOSIS — Z5112 Encounter for antineoplastic immunotherapy: Secondary | ICD-10-CM | POA: Insufficient documentation

## 2020-08-24 DIAGNOSIS — Z87891 Personal history of nicotine dependence: Secondary | ICD-10-CM | POA: Diagnosis not present

## 2020-08-24 DIAGNOSIS — K4020 Bilateral inguinal hernia, without obstruction or gangrene, not specified as recurrent: Secondary | ICD-10-CM | POA: Insufficient documentation

## 2020-08-24 DIAGNOSIS — Z5181 Encounter for therapeutic drug level monitoring: Secondary | ICD-10-CM

## 2020-08-24 DIAGNOSIS — E119 Type 2 diabetes mellitus without complications: Secondary | ICD-10-CM | POA: Insufficient documentation

## 2020-08-24 DIAGNOSIS — Z5189 Encounter for other specified aftercare: Secondary | ICD-10-CM | POA: Diagnosis not present

## 2020-08-24 DIAGNOSIS — Z8042 Family history of malignant neoplasm of prostate: Secondary | ICD-10-CM | POA: Insufficient documentation

## 2020-08-24 LAB — COMPREHENSIVE METABOLIC PANEL
ALT: 42 U/L (ref 0–44)
AST: 46 U/L — ABNORMAL HIGH (ref 15–41)
Albumin: 3.8 g/dL (ref 3.5–5.0)
Alkaline Phosphatase: 44 U/L (ref 38–126)
Anion gap: 9 (ref 5–15)
BUN: 27 mg/dL — ABNORMAL HIGH (ref 8–23)
CO2: 27 mmol/L (ref 22–32)
Calcium: 8.7 mg/dL — ABNORMAL LOW (ref 8.9–10.3)
Chloride: 99 mmol/L (ref 98–111)
Creatinine, Ser: 1.54 mg/dL — ABNORMAL HIGH (ref 0.61–1.24)
GFR, Estimated: 50 mL/min — ABNORMAL LOW (ref >60)
Glucose, Bld: 200 mg/dL — ABNORMAL HIGH (ref 70–99)
Potassium: 3.5 mmol/L (ref 3.5–5.1)
Sodium: 135 mmol/L (ref 135–145)
Total Bilirubin: 0.6 mg/dL (ref 0.3–1.2)
Total Protein: 6.4 g/dL — ABNORMAL LOW (ref 6.5–8.1)

## 2020-08-24 LAB — CBC WITH DIFFERENTIAL/PLATELET
Abs Immature Granulocytes: 0.03 10*3/uL (ref 0.00–0.07)
Basophils Absolute: 0 10*3/uL (ref 0.0–0.1)
Basophils Relative: 0 %
Eosinophils Absolute: 0.1 10*3/uL (ref 0.0–0.5)
Eosinophils Relative: 1 %
HCT: 43.8 % (ref 39.0–52.0)
Hemoglobin: 14.8 g/dL (ref 13.0–17.0)
Immature Granulocytes: 0 %
Lymphocytes Relative: 39 %
Lymphs Abs: 2.9 10*3/uL (ref 0.7–4.0)
MCH: 29.1 pg (ref 26.0–34.0)
MCHC: 33.8 g/dL (ref 30.0–36.0)
MCV: 86.2 fL (ref 80.0–100.0)
Monocytes Absolute: 0.6 10*3/uL (ref 0.1–1.0)
Monocytes Relative: 8 %
Neutro Abs: 3.9 10*3/uL (ref 1.7–7.7)
Neutrophils Relative %: 52 %
Platelets: 203 10*3/uL (ref 150–400)
RBC: 5.08 MIL/uL (ref 4.22–5.81)
RDW: 13.5 % (ref 11.5–15.5)
WBC: 7.6 10*3/uL (ref 4.0–10.5)
nRBC: 0 % (ref 0.0–0.2)

## 2020-08-24 LAB — HEPATITIS B SURFACE ANTIGEN: Hepatitis B Surface Ag: NONREACTIVE

## 2020-08-24 LAB — HEPATITIS B CORE ANTIBODY, TOTAL: Hep B Core Total Ab: NONREACTIVE

## 2020-08-24 LAB — LACTATE DEHYDROGENASE: LDH: 116 U/L (ref 98–192)

## 2020-08-24 MED ORDER — PALONOSETRON HCL INJECTION 0.25 MG/5ML
0.2500 mg | Freq: Once | INTRAVENOUS | Status: AC
  Administered 2020-08-24: 0.25 mg via INTRAVENOUS
  Filled 2020-08-24: qty 5

## 2020-08-24 MED ORDER — HEPARIN SOD (PORK) LOCK FLUSH 100 UNIT/ML IV SOLN
500.0000 [IU] | Freq: Once | INTRAVENOUS | Status: AC | PRN
  Administered 2020-08-24: 500 [IU]
  Filled 2020-08-24: qty 5

## 2020-08-24 MED ORDER — DIPHENHYDRAMINE HCL 25 MG PO CAPS
50.0000 mg | ORAL_CAPSULE | Freq: Once | ORAL | Status: AC
  Administered 2020-08-24: 50 mg via ORAL
  Filled 2020-08-24: qty 2

## 2020-08-24 MED ORDER — VINCRISTINE SULFATE CHEMO INJECTION 1 MG/ML
2.0000 mg | Freq: Once | INTRAVENOUS | Status: AC
Start: 2020-08-24 — End: 2020-08-24
  Administered 2020-08-24: 2 mg via INTRAVENOUS
  Filled 2020-08-24: qty 2

## 2020-08-24 MED ORDER — SODIUM CHLORIDE 0.9 % IV SOLN
150.0000 mg | Freq: Once | INTRAVENOUS | Status: AC
  Administered 2020-08-24: 150 mg via INTRAVENOUS
  Filled 2020-08-24: qty 150

## 2020-08-24 MED ORDER — DOXORUBICIN HCL CHEMO IV INJECTION 2 MG/ML
50.0000 mg/m2 | Freq: Once | INTRAVENOUS | Status: AC
  Administered 2020-08-24: 116 mg via INTRAVENOUS
  Filled 2020-08-24: qty 50

## 2020-08-24 MED ORDER — SODIUM CHLORIDE 0.9 % IV SOLN
Freq: Once | INTRAVENOUS | Status: AC
  Filled 2020-08-24: qty 250

## 2020-08-24 MED ORDER — SODIUM CHLORIDE 0.9 % IV SOLN
10.0000 mg | Freq: Once | INTRAVENOUS | Status: AC
  Administered 2020-08-24: 10 mg via INTRAVENOUS
  Filled 2020-08-24: qty 10

## 2020-08-24 MED ORDER — SODIUM CHLORIDE 0.9 % IV SOLN
375.0000 mg/m2 | Freq: Once | INTRAVENOUS | Status: AC
  Administered 2020-08-24: 900 mg via INTRAVENOUS
  Filled 2020-08-24: qty 50

## 2020-08-24 MED ORDER — PEGFILGRASTIM 6 MG/0.6ML ~~LOC~~ PSKT
6.0000 mg | PREFILLED_SYRINGE | Freq: Once | SUBCUTANEOUS | Status: AC
  Administered 2020-08-24: 6 mg via SUBCUTANEOUS
  Filled 2020-08-24: qty 0.6

## 2020-08-24 MED ORDER — SODIUM CHLORIDE 0.9 % IV SOLN
750.0000 mg/m2 | Freq: Once | INTRAVENOUS | Status: AC
  Administered 2020-08-24: 1740 mg via INTRAVENOUS
  Filled 2020-08-24: qty 87

## 2020-08-24 MED ORDER — ACETAMINOPHEN 325 MG PO TABS
650.0000 mg | ORAL_TABLET | Freq: Once | ORAL | Status: AC
Start: 2020-08-24 — End: 2020-08-24
  Administered 2020-08-24: 650 mg via ORAL
  Filled 2020-08-24: qty 2

## 2020-08-24 MED ORDER — ACETAMINOPHEN 325 MG PO TABS
650.0000 mg | ORAL_TABLET | Freq: Once | ORAL | Status: AC
  Administered 2020-08-24: 650 mg via ORAL

## 2020-08-24 NOTE — Progress Notes (Signed)
Hematology/Oncology Consult note [facility]  Telephone:([phone] [phone]  Patient Care Team: Rusty Aus, MD as PCP - General (Internal Medicine) Sindy Guadeloupe, MD as Consulting Physician (Oncology)   Name of the patient: Eddie Ryan  [id number]  1955/09/19   Date of visit: 08/24/20  Diagnosis- stage I E high-grade B-cell lymphoma  Chief complaint/ Reason for visit-on treatment assessment prior to cycle 1 of R-CHOP chemotherapy  Heme/Onc history: Patient is a 64 year old male with a prior history of DVT about 15 years ago and was diagnosed with antiphospholipid antibody syndrome with a positive lupus anticoagulant. He has remained on Coumadin since then. He has been seeing Dr. Erlene Quan for kidney stones. He had a CT abdomen and pelvis in August 2021 which showed thickening of the urothelium concerning for a urothelial lesion. He underwent a cystoscopy and ureteroscopy in September 2021 which did not show any intraluminal pathology. Plan was to get a repeat CT urogram 6 weeks following that. He had a CT hematuria work-up again in November 2021 which showed enhancing soft tissue lesion in the proximal right ureter measuring 2.3 x 2.3 x 3.5 cm which was extending for 2.6 cm and enlarges compared to prior size of 1.5 x 1.1 x 2.9 cm. There is no evidence of retroperitoneal adenopathy. Left external iliac node was 1.2 cm and was unchanged as compared to prior CT scan. He underwent a repeat ureteroscopy with stent placement.There was no obvious filling defect but there was a slight narrowing of ureter and patient therefore underwent a CT-guided biopsy by IR  Biopsy showed B-cell lymphomaHigh-grade with a Ki-67 of greater than 90%.Comment:  Core biopsy sections display lymphoid tissue. There are aggregates  comprised of larger lymphocytes, interspersed with areas of small mature  lymphocytes, and focal background bands of fibrosis. Typical  follicular  architecture is not present.   Immunohistochemical studies show strong and diffuse positivity for CD20,  indicating a B cell proliferation, comprised of both smaller and larger  lymphocytes. CD3 marks scattered background T cells. The subset of  larger B cells shows positivity for CD10, BCL6, and MUM-1 (partial).  These cells are negative for BCL2, Cyclin-D1, and CD30. BCL2 appears to  show marking in the subset of smaller lymphocytes, as well as normal T  cells. Ki-67 is significantly elevated in the subset of larger B cells,  estimated at greater than 90% staining. C-myc shows dim staining in a  portion of the larger B cells, approximately 20-30%. Pancytokeratin and  CD56 are negative.   Flow cytometric studies detect an abnormal CD10+ B cell population in a  background of polytypic B cells. These CD10+ B cells represent 28% of B  cells and show very dim kappa light chain restriction, to negative light  chain expression. By light scatter, these B cells appear approximately  the same size as background T cells. There is no loss of, or aberrant  expression of, pan T-cell antigens to suggest a neoplastic T cell  process. Cell viability in this sample is sufficient for analysis, but  less than optimal.   Overall classification of this process is limited due to the small size  of core biopsy samples. The histologic and immunophenotypic findings are  compatible with involvement by a B cell lymphoma. CD10 expression, as  seen by immunohistochemistry and flow cytometry, may imply  germinal/follicular center origin of these B cells. Absence of staining  for BCL2 could suggest against follicular lymphoma, although BCL2  expression  may be absent in 25-50% of cases of grade 3 follicular  lymphoma. The differential diagnosis may also include diffuse large B  cell lymphoma, given the increased proportion of larger abnormal B  cells. Further subclassification of this process would be  dependent upon  excisional biopsy or review of overall lesional architecture. There is  sufficient tissue for ancillary FISH or NGS testing, if desired.   Bone marrow biopsy was negative for lymphoma.  PET/CT scan showed:A 3.3 x 2.7 cm soft tissue lesion around the right proximal ureter with an SUV of 13.4.  There was no other hypermetabolic abdominopelvic adenopathy noted.  No hypermetabolism noted in the liver or spleen or other organs.  No intrathoracic adenopathy.  No hypermetabolic activity noted in the bones.    Interval history-patient reports feeling well and denies any specific complaints at this time  ECOG PS- 0 Pain scale- 0 Opioid associated constipation- no  Review of systems- Review of Systems  Constitutional: Negative for chills, fever, malaise/fatigue and weight loss.  HENT: Negative for congestion, ear discharge and nosebleeds.   Eyes: Negative for blurred vision.  Respiratory: Negative for cough, hemoptysis, sputum production, shortness of breath and wheezing.   Cardiovascular: Negative for chest pain, palpitations, orthopnea and claudication.  Gastrointestinal: Negative for abdominal pain, blood in stool, constipation, diarrhea, heartburn, melena, nausea and vomiting.  Genitourinary: Negative for dysuria, flank pain, frequency, hematuria and urgency.  Musculoskeletal: Negative for back pain, joint pain and myalgias.  Skin: Negative for rash.  Neurological: Negative for dizziness, tingling, focal weakness, seizures, weakness and headaches.  Endo/Heme/Allergies: Does not bruise/bleed easily.  Psychiatric/Behavioral: Negative for depression and suicidal ideas. The patient does not have insomnia.        Allergies  Allergen Reactions  . Losartan Potassium-Hctz Other (See Comments)    Unknown reaction.  . Morphine And Related Itching    WITH PROLONGED USE     Past Medical History:  Diagnosis Date  . Anxiety   . Collagen vascular disease ([place])   . Depression    . Diabetes mellitus without complication ([place])   . DVT (deep venous thrombosis) ([place])   . History of kidney stones   . Hypertension   . Lymphoma ([place]) 07/2020  . Sleep apnea      Past Surgical History:  Procedure Laterality Date  . CERVICAL FUSION    . CYSTOSCOPY W/ RETROGRADES Left 04/26/2020   Procedure: CYSTOSCOPY WITH RETROGRADE PYELOGRAM;  Surgeon: Hollice Espy, MD;  Location: [facility] ORS;  Service: Urology;  Laterality: Left;  . CYSTOSCOPY WITH URETEROSCOPY AND STENT PLACEMENT Right 07/02/2020   Procedure: CYSTOSCOPY WITH URETEROSCOPY AND STENT PLACEMENT;  Surgeon: Hollice Espy, MD;  Location: [facility] OR[facility];  Service: Urology;  Laterality: Right;  . CYSTOSCOPY/URETEROSCOPY/HOLMIUM LASER/STENT PLACEMENT Right 04/26/2020   Procedure: CYSTOSCOPY/URETEROSCOPY/HOLMIUM LASER/STENT PLACEMENT;  Surgeon: Hollice Espy, MD;  Location: [facility] OR[facility];  Service: Urology;  Laterality: Right;  . IR IMAGING GUIDED PORT INSERTION  07/27/2020  . PLANTAR FASCIA SURGERY Bilateral   . ROTATOR CUFF REPAIR Left   . THUMB FUSION    . URETERAL BIOPSY Right 04/26/2020   Procedure: URETERAL BIOPSY with ablation;  Surgeon: Hollice Espy, MD;  Location: [facility] OR[facility];  Service: Urology;  Laterality: Right;  . URETEROSCOPY WITH HOLMIUM LASER LITHOTRIPSY      Social History   Socioeconomic History  . Marital status: Single    Spouse name: Not on file  . Number of children: 0  . Years of education: Not on file  . 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 Smoking status: Former Smoker    Types: Cigarettes    Quit date: 1981    Years since quitting: 41.0  . Smokeless tobacco: Never Used  Vaping Use  . Vaping Use: Never used  Substance and Sexual Activity  . Alcohol use: Never  . Drug use: Never  . Sexual activity: Not Currently  Other Topics Concern  . Not on file  Social History Narrative   Lives by himself..    Social Determinants of Health   Financial Resource Strain: Not on file  Food Insecurity: Not on file  Transportation Needs: Not on file  Physical Activity: Not on file  Stress: Not on file  Social Connections: Not on file  Intimate Partner Violence: Not on file    Family History  Problem Relation Age of Onset  . Pancreatic cancer Father   . Prostate cancer Paternal Grandmother   . Leukemia Nephew      Current Outpatient Medications:  .  allopurinol (ZYLOPRIM) 300 MG tablet, Take 1 tablet (300 mg total) by mouth daily., Disp: 30 tablet, Rfl: 3 .  ALPRAZolam (XANAX) 0.5 MG tablet, Take 0.5 mg by mouth in the morning and at bedtime. , Disp: , Rfl:  .  buPROPion (WELLBUTRIN XL) 150 MG 24 hr tablet, Take 300 mg by mouth daily. , Disp: , Rfl:  .  Cyanocobalamin (VITAMIN B-12 PO), Take 1 capsule by mouth daily. Pure Encapsulations B12 Folate Capsules, Disp: , Rfl:  .  diclofenac Sodium (VOLTAREN) 1 % GEL, Apply 1 g topically 4 (four) times daily as needed (thumb as needed for pain.). , Disp: , Rfl:  .  docusate sodium (COLACE) 100 MG capsule, Take 200 mg by mouth daily., Disp: , Rfl:  .  ergocalciferol (VITAMIN D2) 1.25 MG (50000 UT) capsule, Take 50,000 Units by mouth every Monday. , Disp: , Rfl:  .  escitalopram (LEXAPRO) 20 MG tablet, Take 20 mg by mouth daily. , Disp: , Rfl:  .  etodolac (LODINE) 400 MG tablet, Take 400 mg by mouth daily with breakfast., Disp: , Rfl:  .  glipiZIDE (GLUCOTROL XL) 10 MG 24 hr tablet, Take 10 mg by mouth daily with breakfast., Disp: , Rfl:  .  lidocaine-prilocaine (EMLA) cream, Apply to affected area once, Disp: 30 g, Rfl: 3 .  loratadine (CLARITIN) 10 MG tablet, Take 10 mg by mouth daily. , Disp: , Rfl:  .  Magnesium 500 MG TABS, Take 1,000 mg by mouth daily., Disp: , Rfl:  .  oxyCODONE-acetaminophen (PERCOCET) 5-325 MG tablet, Take 1-2 tablets by mouth every 4 (four) hours as needed for moderate pain or severe pain., Disp: 20 tablet, Rfl: 0 .   predniSONE (DELTASONE) 20 MG tablet, Take 5 tablets (100 mg total) by mouth daily. Take with food on days 1-5 of chemotherapy., Disp: 25 tablet, Rfl: 5 .  telmisartan (MICARDIS) 80 MG tablet, Take 40 mg by mouth every evening. , Disp: , Rfl:  .  warfarin (COUMADIN) 5 MG tablet, Take 5 mg by mouth every evening. Take 1.5 tablets (7.5 mg) by mouth on Saturdays in the evening & take 1 tablet (5 mg) by mouth on Sundays, Mondays, Tuesdays, Wednesdays, Thursdays & Fridays., Disp: , Rfl:  .  meclizine (ANTIVERT) 25 MG tablet, Take 25 mg by mouth 2 (two) times daily as needed for dizziness.  (Patient not taking: Reported on 08/24/2020), Disp: ,  Rfl:  .  ondansetron (ZOFRAN) 8 MG tablet, Take 1 tablet (8 mg total) by mouth 2 (two) times daily as needed for refractory nausea / vomiting. Start on day 3 after cyclophosphamide chemotherapy. (Patient not taking: Reported on 08/24/2020), Disp: 30 tablet, Rfl: 1 .  prochlorperazine (COMPAZINE) 10 MG tablet, Take 1 tablet (10 mg total) by mouth every 6 (six) hours as needed (Nausea or vomiting). (Patient not taking: Reported on 08/24/2020), Disp: 30 tablet, Rfl: 6  Physical exam:  Vitals:   08/24/20 0851  BP: 116/79  Pulse: 78  Resp: 16  Temp: (!) 97.2 F (36.2 C)  Weight: 224 lb 11.2 oz (101.9 kg)  Height: _0  (1.88 m)   Physical Exam Eyes:     Extraocular Movements: EOM normal.  Cardiovascular:     Rate and Rhythm: Normal rate and regular rhythm.     Heart sounds: Normal heart sounds.  Pulmonary:     Effort: Pulmonary effort is normal.  Skin:    General: Skin is warm and dry.  Neurological:     Mental Status: He is alert.      CMP Latest Ref Rng & Units 08/24/2020  Glucose 70 - 99 mg/dL 200(H)  BUN 8 - 23 mg/dL 27(H)  Creatinine 0.61 - 1.24 mg/dL 1.54(H)  Sodium 135 - 145 mmol/L 135  Potassium 3.5 - 5.1 mmol/L 3.5  Chloride 98 - 111 mmol/L 99  CO2 22 - 32 mmol/L 27  Calcium 8.9 - 10.3 mg/dL 8.7(L)  Total Protein 6.5 - 8.1 g/dL 6.4(L)  Total  Bilirubin 0.3 - 1.2 mg/dL 0.6  Alkaline Phos 38 - 126 U/L 44  AST 15 - 41 U/L 46(H)  ALT 0 - 44 U/L 42   CBC Latest Ref Rng & Units 08/24/2020  WBC 4.0 - 10.5 K/uL 7.6  Hemoglobin 13.0 - 17.0 g/dL 14.8  Hematocrit 39.0 - 52.0 % 43.8  Platelets 150 - 400 K/uL 203    No images are attached to the encounter.  NM PET Image Initial (PI) Skull Base To Thigh  Result Date: 08/02/2020 CLINICAL DATA:  Initial treatment strategy for lymphoma. EXAM: NUCLEAR MEDICINE PET SKULL BASE TO THIGH TECHNIQUE: 12.1 mCi F-18 FDG was injected intravenously. Full-ring PET imaging was performed from the skull base to thigh after the radiotracer. CT data was obtained and used for attenuation correction and anatomic localization. Fasting blood glucose: 127 mg/dl COMPARISON:  CT abdomen/pelvis dated 06/21/2020 FINDINGS: Mediastinal blood pool activity: SUV max 2.9 Liver activity: SUV max NA NECK: No hypermetabolic cervical lymphadenopathy. Incidental CT findings: none CHEST: No hypermetabolic thoracic lymphadenopathy. No suspicious pulmonary nodules. Right chest port terminates the cavoatrial junction. Incidental CT findings: Very mild atherosclerotic calcifications of the aortic root. 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 Max SUV 13.4, although this is difficult to distinguish from the adjacent excretory activity within the right renal collecting system and ureter. No hypermetabolic abdominopelvic lymphadenopathy. Spleen is normal in size, without focal lesion. Reference splenic activity demonstrates max SUV 4.0. No abnormal hypermetabolism in the liver, pancreas, or adrenal glands. Incidental CT findings: Bilateral renal cysts. Atherosclerotic calcifications of the abdominal aorta and branch vessels. Mildly thick-walled bladder, although underdistended. Tiny fat containing bilateral inguinal hernias. SKELETON: No focal  hypermetabolic activity to suggest skeletal metastasis. Incidental CT findings: Mild degenerative changes of the visualized thoracolumbar spine. IMPRESSION: 3.3 cm soft tissue lesion along the right proximal ureter, corresponding to the  patient's known right ureteral lymphoma. No suspicious lymphadenopathy in the neck, chest, abdomen, or pelvis. Spleen is normal in size. Electronically Signed   By: Julian Hy M.D.   On: 08/02/2020 14:05   CT BONE MARROW BIOPSY & ASPIRATION  Result Date: 07/27/2020 INDICATION: History of lymphoma. Please perform CT-guided bone marrow biopsy for tissue diagnostic purposes. EXAM: CT-GUIDED BONE MARROW BIOPSY AND ASPIRATION MEDICATIONS: None ANESTHESIA/SEDATION: Fentanyl 50 mcg IV; Versed 1 mg IV Sedation Time: 10 Minutes; The patient was continuously monitored during the procedure by the interventional radiology nurse under my direct supervision. COMPLICATIONS: None immediate. PROCEDURE: Informed consent was obtained from the patient following an explanation of the procedure, risks, benefits and alternatives. The patient understands, agrees and consents for the procedure. All questions were addressed. A time out was performed prior to the initiation of the procedure. The patient was positioned prone and non-contrast localization CT was performed of the pelvis to demonstrate the iliac marrow spaces. The operative site was prepped and draped in the usual sterile fashion. Under sterile conditions and local anesthesia, a 22 gauge spinal needle was utilized for procedural planning. Next, an 11 gauge coaxial bone biopsy needle was advanced into the left iliac marrow space. Needle position was confirmed with CT imaging. Initially, a bone marrow aspiration was performed. Next, a bone marrow biopsy was obtained with the 11 gauge outer bone marrow device. The needle was removed and superficial hemostasis was obtained with manual compression. A dressing was applied. The patient  tolerated the procedure well without immediate post procedural complication. IMPRESSION: Successful CT guided left iliac bone marrow aspiration and core biopsy. Electronically Signed   By: Sandi Mariscal M.D.   On: 07/27/2020 09:46   ECHOCARDIOGRAM COMPLETE  Result Date: 07/26/2020    ECHOCARDIOGRAM REPORT   Patient Name:   PASCUAL MANTEL [facility] Date of Exam: 07/26/2020 Medical Rec #:  [id number]      Height:       74.0 in Accession #:    [id number]     Weight:       225.3 lb Date of Birth:  1956-04-24       BSA:          2.286 m Patient Age:    47 years       BP:           136/78 mmHg Patient Gender: M              HR:           76 bpm. Exam Location:  [facility] Procedure: 2D Echo, Cardiac Doppler, Color Doppler and Strain Analysis Indications:     Chemo V 67.2  History:         Patient has no prior history of Echocardiogram examinations.                  Risk Factors:Diabetes. DVT.  Sonographer:     Sherrie Sport RDCS (AE) Referring Phys:  [id number] Weston Anna Jeshua Ransford Diagnosing Phys: Ida Rogue MD  Sonographer Comments: Global longitudinal strain was attempted. IMPRESSIONS  1. Left ventricular ejection fraction, by estimation, is 60 to 65%. The left ventricle has normal function. The left ventricle has no regional wall motion abnormalities. Left ventricular diastolic parameters are consistent with Grade I diastolic dysfunction (impaired relaxation). The average left ventricular global longitudinal strain is -16.8 %. The global longitudinal strain is normal.  2. Right ventricular systolic function is normal. The right ventricular size is normal. FINDINGS  Left Ventricle: Left  ventricular ejection fraction, by estimation, is 60 to 65%. The left ventricle has normal function. The left ventricle has no regional wall motion abnormalities. The average left ventricular global longitudinal strain is -16.8 %. The global longitudinal strain is normal. The left ventricular internal cavity size was normal in size. There is no left ventricular  hypertrophy. Left ventricular diastolic parameters are consistent with Grade I diastolic dysfunction (impaired relaxation). Right Ventricle: The right ventricular size is normal. No increase in right ventricular wall thickness. Right ventricular systolic function is normal. Left Atrium: Left atrial size was normal in size. Right Atrium: Right atrial size was normal in size. Pericardium: There is no evidence of pericardial effusion. Mitral Valve: The mitral valve is normal in structure. Mild mitral valve regurgitation. No evidence of mitral valve stenosis. Tricuspid Valve: The tricuspid valve is normal in structure. Tricuspid valve regurgitation is not demonstrated. No evidence of tricuspid stenosis. Aortic Valve: The aortic valve is normal in structure. Aortic valve regurgitation is not visualized. No aortic stenosis is present. Aortic valve mean gradient measures 3.0 mmHg. Aortic valve peak gradient measures 4.2 mmHg. Aortic valve area, by VTI measures 2.89 cm. Pulmonic Valve: The pulmonic valve was normal in structure. Pulmonic valve regurgitation is not visualized. No evidence of pulmonic stenosis. Aorta: The aortic root is normal in size and structure. Venous: The inferior vena cava is normal in size with greater than 50% respiratory variability, suggesting right atrial pressure of 3 mmHg. IAS/Shunts: No atrial level shunt detected by color flow Doppler.  LEFT VENTRICLE PLAX 2D LVIDd:         4.48 cm  Diastology LVIDs:         2.44 cm  LV e' medial:    5.33 cm/s LV PW:         0.97 cm  LV E/e' medial:  12.9 LV IVS:        0.95 cm  LV e' lateral:   11.20 cm/s LVOT diam:     2.00 cm  LV E/e' lateral: 6.1 LV SV:         58 LV SV Index:   25       2D Longitudinal Strain LVOT Area:     3.14 cm 2D Strain GLS (A2C):   -16.4 %                         2D Strain GLS (A3C):   -16.8 %                         2D Strain GLS (A4C):   -17.4 %                         2D Strain GLS Avg:     -16.8 %                          3D  Volume EF:                         3D EF:        52 %                         LV EDV:       107 ml  LV ESV:       51 ml                         LV SV:        55 ml RIGHT VENTRICLE RV Basal diam:  1.74 cm RV S prime:     10.20 cm/s TAPSE (M-mode): 3.8 cm LEFT ATRIUM             Index       RIGHT ATRIUM           Index LA diam:        2.70 cm 1.18 cm/m  RA Area:     17.10 cm LA Vol (A2C):   39.9 ml 17.45 ml/m RA Volume:   48.20 ml  21.08 ml/m LA Vol (A4C):   22.4 ml 9.80 ml/m LA Biplane Vol: 32.0 ml 14.00 ml/m  AORTIC VALVE                   PULMONIC VALVE AV Area (Vmax):    2.39 cm    PV Vmax:        0.75 m/s AV Area (Vmean):   2.32 cm    PV Peak grad:   2.2 mmHg AV Area (VTI):     2.89 cm    RVOT Peak grad: 3 mmHg AV Vmax:           102.00 cm/s AV Vmean:          74.800 cm/s AV VTI:            0.201 m AV Peak Grad:      4.2 mmHg AV Mean Grad:      3.0 mmHg LVOT Vmax:         77.70 cm/s LVOT Vmean:        55.300 cm/s LVOT VTI:          0.185 m LVOT/AV VTI ratio: 0.92  AORTA Ao Root diam: 3.50 cm MITRAL VALVE               TRICUSPID VALVE MV Area (PHT): 2.62 cm    TR Peak grad:   15.2 mmHg MV Decel Time: 290 msec    TR Vmax:        195.00 cm/s MV E velocity: 68.60 cm/s MV A velocity: 59.60 cm/s  SHUNTS MV E/A ratio:  1.15        Systemic VTI:  0.18 m                            Systemic Diam: 2.00 cm Ida Rogue MD Electronically signed by Ida Rogue MD Signature Date/Time: 07/26/2020/4:34:22 PM    Final    IR IMAGING GUIDED PORT INSERTION  Result Date: 07/27/2020 INDICATION: History of lymphoma. In need of durable intravenous access for chemotherapy administration. EXAM: IMPLANTED PORT A CATH PLACEMENT WITH ULTRASOUND AND FLUOROSCOPIC GUIDANCE COMPARISON:  None. MEDICATIONS: Ancef 2 gm IV; The antibiotic was administered within an appropriate time interval prior to skin puncture. ANESTHESIA/SEDATION: Moderate (conscious) sedation was employed during this procedure. A total  of Versed 2 mg and Fentanyl 100 mcg was administered intravenously. Moderate Sedation Time: 16 minutes. The patient's level of consciousness and vital signs were monitored continuously by radiology nursing throughout the procedure under my direct supervision. CONTRAST:  None FLUOROSCOPY TIME:  18 seconds (3.2 mGy) COMPLICATIONS: None immediate. PROCEDURE: The procedure, risks, benefits, and alternatives were  explained to the patient. Questions regarding the procedure were encouraged and answered. The patient understands and consents to the procedure. The right neck and chest were prepped with chlorhexidine in a sterile fashion, and a sterile drape was applied covering the operative field. Maximum barrier sterile technique with sterile gowns and gloves were used for the procedure. A timeout was performed prior to the initiation of the procedure. Local anesthesia was provided with 1% lidocaine with epinephrine. After creating a small venotomy incision, a micropuncture kit was utilized to access the internal jugular vein. Real-time ultrasound guidance was utilized for vascular access including the acquisition of a permanent ultrasound image documenting patency of the accessed vessel. The microwire was utilized to measure appropriate catheter length. A subcutaneous port pocket was then created along the upper chest wall utilizing a combination of sharp and blunt dissection. The pocket was irrigated with sterile saline. A single lumen "standard sized" power injectable port was chosen for placement. The 8 Fr catheter was tunneled from the port pocket site to the venotomy incision. The port was placed in the pocket. The external catheter was trimmed to appropriate length. At the venotomy, an 8 Fr peel-away sheath was placed over a guidewire under fluoroscopic guidance. The catheter was then placed through the sheath and the sheath was removed. Final catheter positioning was confirmed and documented with a fluoroscopic spot  radiograph. The port was accessed with a Huber needle, aspirated and flushed with heparinized saline. The venotomy site was closed with an interrupted 4-0 Vicryl suture. The port pocket incision was closed with interrupted 2-0 Vicryl suture. Dermabond and Steri-strips were applied to both incisions. Dressings were applied. The patient tolerated the procedure well without immediate post procedural complication. FINDINGS: After catheter placement, the tip lies within the superior cavoatrial junction. The catheter aspirates and flushes normally and is ready for immediate use. IMPRESSION: Successful placement of a right internal jugular approach power injectable Port-A-Cath. The catheter is ready for immediate use. Electronically Signed   By: Sandi Mariscal M.D.   On: 07/27/2020 10:28     Assessment and plan- Patient is a 65 y.o. 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  IPI score 1  Counts okay to proceed with cycle 1 of R-CHOP chemotherapy today with on for Neulasta support.  Patient already received 5 days of prednisone last week and will not take any further prednisone with this cycle.  His blood sugar today is 200 and he is not a known diabetic.  He reports that he has diet-controlled diabetes.  Continue to monitor blood sugars while he would be on prednisone with chemotherapy.  I will see him back in 10 days with CBC with differential, BMP for possible IV fluids.  He has baseline CKD which we will monitor  Return to clinic in 3 weeks with port labs CBC with differential, CMP for cycle 2 of R-CHOP chemotherapy with on for Neulasta support.  Plan to give at least 3 cycles of R-CHOP chemotherapy followed by PET scan and consideration for involved field radiation   Visit Diagnosis 1. Encounter for antineoplastic chemotherapy   2. Encounter for monitoring rituximab therapy   3. Diffuse  large B-cell lymphoma of extranodal site excluding spleen and other solid organs [facility])      Dr. Randa Evens, MD, MPH [facility] [phone] 08/24/2020 12:04 PM

## 2020-08-24 NOTE — Progress Notes (Signed)
Pt new start for chemo , pt has lymphoma

## 2020-08-25 LAB — HEPATITIS B SURFACE ANTIBODY, QUANTITATIVE: Hep B S AB Quant (Post): <3.1 m[IU]/mL — ABNORMAL LOW (ref >9.9)

## 2020-08-27 ENCOUNTER — Other Ambulatory Visit: Payer: Self-pay | Admitting: *Deleted

## 2020-08-27 ENCOUNTER — Telehealth: Payer: Self-pay

## 2020-08-27 NOTE — Telephone Encounter (Signed)
Telephone call to patient for follow up after receiving first infusion.   Patient states infusion went great.  States eating fairly good and drinking fluids.   Denies any nausea or vomiting.  Encouraged patient to call for any concerns or questions.

## 2020-09-04 ENCOUNTER — Inpatient Hospital Stay: Payer: Commercial Managed Care - PPO

## 2020-09-04 ENCOUNTER — Ambulatory Visit: Payer: Commercial Managed Care - PPO

## 2020-09-04 ENCOUNTER — Encounter: Payer: Self-pay | Admitting: Oncology

## 2020-09-04 ENCOUNTER — Inpatient Hospital Stay (HOSPITAL_BASED_OUTPATIENT_CLINIC_OR_DEPARTMENT_OTHER): Payer: Commercial Managed Care - PPO | Admitting: Oncology

## 2020-09-04 VITALS — BP 131/75 | HR 87 | Temp 97.3°F | Resp 17 | Wt 225.4 lb

## 2020-09-04 DIAGNOSIS — Z5112 Encounter for antineoplastic immunotherapy: Secondary | ICD-10-CM | POA: Diagnosis not present

## 2020-09-04 DIAGNOSIS — Z5181 Encounter for therapeutic drug level monitoring: Secondary | ICD-10-CM

## 2020-09-04 DIAGNOSIS — C8339 Diffuse large B-cell lymphoma, extranodal and solid organ sites: Secondary | ICD-10-CM

## 2020-09-04 DIAGNOSIS — Z5111 Encounter for antineoplastic chemotherapy: Secondary | ICD-10-CM

## 2020-09-04 LAB — CBC WITH DIFFERENTIAL/PLATELET
Abs Immature Granulocytes: 0.4 10*3/uL — ABNORMAL HIGH (ref 0.00–0.07)
Band Neutrophils: 5 %
Basophils Absolute: 0 10*3/uL (ref 0.0–0.1)
Basophils Relative: 0 %
Eosinophils Absolute: 0.1 10*3/uL (ref 0.0–0.5)
Eosinophils Relative: 1 %
HCT: 42.4 % (ref 39.0–52.0)
Hemoglobin: 14.1 g/dL (ref 13.0–17.0)
Lymphocytes Relative: 29 %
Lymphs Abs: 2.3 10*3/uL (ref 0.7–4.0)
MCH: 28.4 pg (ref 26.0–34.0)
MCHC: 33.3 g/dL (ref 30.0–36.0)
MCV: 85.5 fL (ref 80.0–100.0)
Metamyelocytes Relative: 3 %
Monocytes Absolute: 0.4 10*3/uL (ref 0.1–1.0)
Monocytes Relative: 5 %
Myelocytes: 2 %
Neutro Abs: 4.7 10*3/uL (ref 1.7–7.7)
Neutrophils Relative %: 55 %
Platelets: 128 10*3/uL — ABNORMAL LOW (ref 150–400)
RBC: 4.96 MIL/uL (ref 4.22–5.81)
RDW: 13.6 % (ref 11.5–15.5)
Smear Review: NORMAL
WBC: 7.8 10*3/uL (ref 4.0–10.5)
nRBC: 0.4 % — ABNORMAL HIGH (ref 0.0–0.2)

## 2020-09-04 LAB — BASIC METABOLIC PANEL
Anion gap: 13 (ref 5–15)
BUN: 20 mg/dL (ref 8–23)
CO2: 22 mmol/L (ref 22–32)
Calcium: 9.1 mg/dL (ref 8.9–10.3)
Chloride: 100 mmol/L (ref 98–111)
Creatinine, Ser: 1.38 mg/dL — ABNORMAL HIGH (ref 0.61–1.24)
GFR, Estimated: 57 mL/min — ABNORMAL LOW (ref >60)
Glucose, Bld: 274 mg/dL — ABNORMAL HIGH (ref 70–99)
Potassium: 3.8 mmol/L (ref 3.5–5.1)
Sodium: 135 mmol/L (ref 135–145)

## 2020-09-04 MED ORDER — PREDNISONE 20 MG PO TABS: 100.0000 mg | ORAL_TABLET | Freq: Every day | ORAL | 1 refills | Status: DC

## 2020-09-04 MED ORDER — HEPARIN SOD (PORK) LOCK FLUSH 100 UNIT/ML IV SOLN
500.0000 [IU] | Freq: Once | INTRAVENOUS | Status: AC
  Administered 2020-09-04: 500 [IU] via INTRAVENOUS
  Filled 2020-09-04: qty 5

## 2020-09-04 MED ORDER — SODIUM CHLORIDE 0.9% FLUSH
10.0000 mL | INTRAVENOUS | Status: DC | PRN
  Administered 2020-09-04: 10 mL via INTRAVENOUS
  Filled 2020-09-04: qty 10

## 2020-09-04 NOTE — Progress Notes (Signed)
Had headache (before he left chemo area) and fatigue post chemo treatment. Tension/anxiety may have added to it. Pt is eating and drinking. Fri night he felt a new kidney stone pain in lower back. He took 1/2 oxycodone x 2 and pain has not returned. Dr Erlene Quan just got rid of a kidney stone for him which is how she found out he had cancer.

## 2020-09-04 NOTE — Progress Notes (Signed)
Hematology/Oncology Consult note [place]  Telephone:([phone] [phone]  Patient Care Team: Rusty Aus, MD as PCP - General (Internal Medicine) Sindy Guadeloupe, MD as Consulting Physician (Oncology)   Name of the patient: Eddie Ryan  [id number]  1956/05/20   Date of visit: 09/04/20  Diagnosis- stage I E high-grade B-cell lymphoma  Chief complaint/ Reason for visit-toxicity check after 1 cycle of R-CHOP chemotherapy  Heme/Onc history: Patient is a 65 year old male with a prior history of DVT about 15 years ago and was diagnosed with antiphospholipid antibody syndrome with a positive lupus anticoagulant. He has remained on Coumadin since then. He has been seeing Dr. Erlene Quan for kidney stones. He had a CT abdomen and pelvis in August 2021 which showed thickening of the urothelium concerning for a urothelial lesion. He underwent a cystoscopy and ureteroscopy in September 2021 which did not show any intraluminal pathology. Plan was to get a repeat CT urogram 6 weeks following that. He had a CT hematuria work-up again in November 2021 which showed enhancing soft tissue lesion in the proximal right ureter measuring 2.3 x 2.3 x 3.5 cm which was extending for 2.6 cm and enlarges compared to prior size of 1.5 x 1.1 x 2.9 cm. There is no evidence of retroperitoneal adenopathy. Left external iliac node was 1.2 cm and was unchanged as compared to prior CT scan. He underwent a repeat ureteroscopy with stent placement.There was no obvious filling defect but there was a slight narrowing of ureter and patient therefore underwent a CT-guided biopsy by IR  Biopsy showed B-cell lymphomaHigh-grade with a Ki-67 of greater than 90%.Comment:  Core biopsy sections display lymphoid tissue. There are aggregates  comprised of larger lymphocytes, interspersed with areas of small mature  lymphocytes, and focal background bands of fibrosis. Typical follicular   architecture is not present.   Immunohistochemical studies show strong and diffuse positivity for CD20,  indicating a B cell proliferation, comprised of both smaller and larger  lymphocytes. CD3 marks scattered background T cells. The subset of  larger B cells shows positivity for CD10, BCL6, and MUM-1 (partial).  These cells are negative for BCL2, Cyclin-D1, and CD30. BCL2 appears to  show marking in the subset of smaller lymphocytes, as well as normal T  cells. Ki-67 is significantly elevated in the subset of larger B cells,  estimated at greater than 90% staining. C-myc shows dim staining in a  portion of the larger B cells, approximately 20-30%. Pancytokeratin and  CD56 are negative.   Flow cytometric studies detect an abnormal CD10+ B cell population in a  background of polytypic B cells. These CD10+ B cells represent 28% of B  cells and show very dim kappa light chain restriction, to negative light  chain expression. By light scatter, these B cells appear approximately  the same size as background T cells. There is no loss of, or aberrant  expression of, pan T-cell antigens to suggest a neoplastic T cell  process. Cell viability in this sample is sufficient for analysis, but  less than optimal.   Overall classification of this process is limited due to the small size  of core biopsy samples. The histologic and immunophenotypic findings are  compatible with involvement by a B cell lymphoma. CD10 expression, as  seen by immunohistochemistry and flow cytometry, may imply  germinal/follicular center origin of these B cells. Absence of staining  for BCL2 could suggest against follicular lymphoma, although BCL2  expression may be  absent in 25-50% of cases of grade 3 follicular  lymphoma. The differential diagnosis may also include diffuse large B  cell lymphoma, given the increased proportion of larger abnormal B  cells. Further subclassification of this process would be dependent  upon  excisional biopsy or review of overall lesional architecture. There is  sufficient tissue for ancillary FISH or NGS testing, if desired.   Bone marrow biopsy was negative for lymphoma. PET/CT scan showed:A 3.3 x 2.7 cm soft tissue lesion around the right proximal ureter with an SUV of 13.4. There was no other hypermetabolic abdominopelvic adenopathy noted. No hypermetabolism noted in the liver or spleen or other organs. No intrathoracic adenopathy. No hypermetabolic activity noted in the bones.   Interval history-patient reports headache which started on the day of his first chemo and lasted for 2 to 3 days.  He also felt fatigued for about 2 to 3 days and then started feeling better.  He has been keeping up with his oral intake.  Today he feels well  ECOG PS- 0 Pain scale- 0 Opioid associated constipation- no  Review of systems- Review of Systems  Constitutional: Positive for malaise/fatigue. Negative for chills, fever and weight loss.  HENT: Negative for congestion, ear discharge and nosebleeds.   Eyes: Negative for blurred vision.  Respiratory: Negative for cough, hemoptysis, sputum production, shortness of breath and wheezing.   Cardiovascular: Negative for chest pain, palpitations, orthopnea and claudication.  Gastrointestinal: Negative for abdominal pain, blood in stool, constipation, diarrhea, heartburn, melena, nausea and vomiting.  Genitourinary: Negative for dysuria, flank pain, frequency, hematuria and urgency.  Musculoskeletal: Negative for back pain, joint pain and myalgias.  Skin: Negative for rash.  Neurological: Negative for dizziness, tingling, focal weakness, seizures, weakness and headaches.  Endo/Heme/Allergies: Does not bruise/bleed easily.  Psychiatric/Behavioral: Negative for depression and suicidal ideas. The patient does not have insomnia.      Allergies  Allergen Reactions  . Losartan Potassium-Hctz Other (See Comments)    Unknown reaction.  .  Morphine And Related Itching    WITH PROLONGED USE     Past Medical History:  Diagnosis Date  . Anxiety   . Collagen vascular disease ([place])   . Depression   . Diabetes mellitus without complication ([place])   . DVT (deep venous thrombosis) ([place])   . History of kidney stones   . Hypertension   . Lymphoma ([place]) 07/2020  . Sleep apnea      Past Surgical History:  Procedure Laterality Date  . CERVICAL FUSION    . CYSTOSCOPY W/ RETROGRADES Left 04/26/2020   Procedure: CYSTOSCOPY WITH RETROGRADE PYELOGRAM;  Surgeon: Hollice Espy, MD;  Location: [facility] ORS;  Service: Urology;  Laterality: Left;  . CYSTOSCOPY WITH URETEROSCOPY AND STENT PLACEMENT Right 07/02/2020   Procedure: CYSTOSCOPY WITH URETEROSCOPY AND STENT PLACEMENT;  Surgeon: Hollice Espy, MD;  Location: [facility] ORS;  Service: Urology;  Laterality: Right;  . CYSTOSCOPY/URETEROSCOPY/HOLMIUM LASER/STENT PLACEMENT Right 04/26/2020   Procedure: CYSTOSCOPY/URETEROSCOPY/HOLMIUM LASER/STENT PLACEMENT;  Surgeon: Hollice Espy, MD;  Location: [facility] ORS;  Service: Urology;  Laterality: Right;  . IR IMAGING GUIDED PORT INSERTION  07/27/2020  . PLANTAR FASCIA SURGERY Bilateral   . ROTATOR CUFF REPAIR Left   . THUMB FUSION    . URETERAL BIOPSY Right 04/26/2020   Procedure: URETERAL BIOPSY with ablation;  Surgeon: Hollice Espy, MD;  Location: [facility] ORS;  Service: Urology;  Laterality: Right;  . URETEROSCOPY WITH HOLMIUM LASER LITHOTRIPSY      Social History   Socioeconomic History  .  Marital status: Single    Spouse name: Not on file  . Number of children: 0  . Years of education: Not on file  . 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 Smoking status: Former Smoker    Types: Cigarettes    Quit date: 1981    Years since quitting: 41.0  . Smokeless tobacco: Never Used  Vaping Use  . Vaping Use: Never used  Substance and Sexual Activity  .  Alcohol use: Never  . Drug use: Never  . Sexual activity: Not Currently  Other Topics Concern  . Not on file  Social History Narrative   Lives by himself..   Social Determinants of Health   Financial Resource Strain: Not on file  Food Insecurity: Not on file  Transportation Needs: Not on file  Physical Activity: Not on file  Stress: Not on file  Social Connections: Not on file  Intimate Partner Violence: Not on file    Family History  Problem Relation Age of Onset  . Pancreatic cancer Father   . Prostate cancer Paternal Grandmother   . Leukemia Nephew      Current Outpatient Medications:  .  allopurinol (ZYLOPRIM) 300 MG tablet, Take 1 tablet (300 mg total) by mouth daily., Disp: 30 tablet, Rfl: 3 .  ALPRAZolam (XANAX) 0.5 MG tablet, Take 0.5 mg by mouth in the morning and at bedtime. , Disp: , Rfl:  .  buPROPion (WELLBUTRIN XL) 150 MG 24 hr tablet, Take 300 mg by mouth daily. , Disp: , Rfl:  .  Cyanocobalamin (VITAMIN B-12 PO), Take 1 capsule by mouth daily. Pure Encapsulations B12 Folate Capsules, Disp: , Rfl:  .  diclofenac Sodium (VOLTAREN) 1 % GEL, Apply 1 g topically 4 (four) times daily as needed (thumb as needed for pain.). , Disp: , Rfl:  .  ergocalciferol (VITAMIN D2) 1.25 MG (50000 UT) capsule, Take 50,000 Units by mouth every Monday. , Disp: , Rfl:  .  escitalopram (LEXAPRO) 20 MG tablet, Take 20 mg by mouth daily. , Disp: , Rfl:  .  etodolac (LODINE) 400 MG tablet, Take 400 mg by mouth daily with breakfast., Disp: , Rfl:  .  glipiZIDE (GLUCOTROL XL) 10 MG 24 hr tablet, Take 10 mg by mouth daily with breakfast., Disp: , Rfl:  .  lidocaine-prilocaine (EMLA) cream, Apply to affected area once, Disp: 30 g, Rfl: 3 .  loratadine (CLARITIN) 10 MG tablet, Take 10 mg by mouth daily. , Disp: , Rfl:  .  Magnesium 500 MG TABS, Take 1,000 mg by mouth daily., Disp: , Rfl:  .  meclizine (ANTIVERT) 25 MG tablet, Take 25 mg by mouth 2 (two) times daily as needed for dizziness.,  Disp: , Rfl:  .  oxyCODONE-acetaminophen (PERCOCET) 5-325 MG tablet, Take 1-2 tablets by mouth every 4 (four) hours as needed for moderate pain or severe pain., Disp: 20 tablet, Rfl: 0 .  telmisartan (MICARDIS) 80 MG tablet, Take 40 mg by mouth every evening. , Disp: , Rfl:  .  warfarin (COUMADIN) 5 MG tablet, Take 5 mg by mouth every evening. Take 1.5 tablets (7.5 mg) by mouth on Saturdays in the evening & take 1 tablet (5 mg) by mouth on Sundays, Mondays, Tuesdays, Wednesdays, Thursdays & Fridays., Disp: , Rfl:  .  docusate sodium (COLACE) 100 MG capsule, Take 200 mg by mouth daily., Disp: , Rfl:  .  ondansetron (ZOFRAN)  8 MG tablet, Take 1 tablet (8 mg total) by mouth 2 (two) times daily as needed for refractory nausea / vomiting. Start on day 3 after cyclophosphamide chemotherapy. (Patient not taking: No sig reported), Disp: 30 tablet, Rfl: 1 .  predniSONE (DELTASONE) 20 MG tablet, Take 5 tablets (100 mg total) by mouth daily. Take with food on days 1-5 of chemotherapy., Disp: 25 tablet, Rfl: 5 .  prochlorperazine (COMPAZINE) 10 MG tablet, Take 1 tablet (10 mg total) by mouth every 6 (six) hours as needed (Nausea or vomiting). (Patient not taking: No sig reported), Disp: 30 tablet, Rfl: 6 No current facility-administered medications for this visit.  Facility-Administered Medications Ordered in Other Visits:  .  sodium chloride flush (NS) 0.9 % injection 10 mL, 10 mL, Intravenous, PRN, Sindy Guadeloupe, MD, 10 mL at 09/04/20 0810  Physical exam:  Vitals:   09/04/20 0833  BP: 131/75  Pulse: 87  Resp: 17  Temp: (!) 97.3 F (36.3 C)  TempSrc: Tympanic  SpO2: 97%  Weight: 225 lb 6.4 oz (102.2 kg)   Physical Exam Constitutional:      General: He is not in acute distress. Eyes:     Extraocular Movements: EOM normal.  Cardiovascular:     Rate and Rhythm: Normal rate and regular rhythm.     Heart sounds: Normal heart sounds.  Pulmonary:     Effort: Pulmonary effort is normal.  Skin:     General: Skin is warm and dry.  Neurological:     Mental Status: He is alert and oriented to person, place, and time.      CMP Latest Ref Rng & Units 09/04/2020  Glucose 70 - 99 mg/dL 274(H)  BUN 8 - 23 mg/dL 20  Creatinine 0.61 - 1.24 mg/dL 1.38(H)  Sodium 135 - 145 mmol/L 135  Potassium 3.5 - 5.1 mmol/L 3.8  Chloride 98 - 111 mmol/L 100  CO2 22 - 32 mmol/L 22  Calcium 8.9 - 10.3 mg/dL 9.1  Total Protein 6.5 - 8.1 g/dL -  Total Bilirubin 0.3 - 1.2 mg/dL -  Alkaline Phos 38 - 126 U/L -  AST 15 - 41 U/L -  ALT 0 - 44 U/L -   CBC Latest Ref Rng & Units 09/04/2020  WBC 4.0 - 10.5 K/uL 7.8  Hemoglobin 13.0 - 17.0 g/dL 14.1  Hematocrit 39.0 - 52.0 % 42.4  Platelets 150 - 400 K/uL 128(L)     Assessment and plan- Patient is a 65 y.o. 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  IPI score 1.  He is here for toxicity check after 1 cycle of R-CHOP chemotherapy  Patient tolerated R-CHOP chemotherapy well other than mild self-limited headache after treatment.  He is keeping up with his oral intake and therefore does not require any IV fluids at this time.  Renal functions are stable mild thrombocytopenia likely secondary to chemotherapy which we will monitor  I will see him back on 09/14/2020 with CBC with differential and CMP for cycle 2 of R-CHOP chemotherapy with on pro-Neulasta support     Visit Diagnosis 1. Diffuse large B-cell lymphoma of extranodal site excluding spleen and other solid organs [facility])      Dr. Randa Evens, MD, MPH [facility] [phone] 09/04/2020 10:54 AM

## 2020-09-11 ENCOUNTER — Ambulatory Visit: Payer: Commercial Managed Care - PPO

## 2020-09-11 ENCOUNTER — Other Ambulatory Visit: Payer: Commercial Managed Care - PPO

## 2020-09-11 ENCOUNTER — Ambulatory Visit: Payer: Commercial Managed Care - PPO | Admitting: Oncology

## 2020-09-14 ENCOUNTER — Inpatient Hospital Stay: Payer: Commercial Managed Care - PPO | Attending: Oncology

## 2020-09-14 ENCOUNTER — Other Ambulatory Visit: Payer: Commercial Managed Care - PPO

## 2020-09-14 ENCOUNTER — Inpatient Hospital Stay: Payer: Commercial Managed Care - PPO | Admitting: Oncology

## 2020-09-14 ENCOUNTER — Inpatient Hospital Stay: Payer: Commercial Managed Care - PPO

## 2020-09-14 ENCOUNTER — Ambulatory Visit: Payer: Commercial Managed Care - PPO

## 2020-09-14 ENCOUNTER — Ambulatory Visit: Payer: Commercial Managed Care - PPO | Admitting: Oncology

## 2020-09-14 ENCOUNTER — Encounter: Payer: Self-pay | Admitting: Oncology

## 2020-09-14 VITALS — BP 105/72 | HR 75 | Temp 98.2°F | Resp 16 | Ht 74.0 in | Wt 223.9 lb

## 2020-09-14 DIAGNOSIS — Z86718 Personal history of other venous thrombosis and embolism: Secondary | ICD-10-CM | POA: Diagnosis not present

## 2020-09-14 DIAGNOSIS — Z5181 Encounter for therapeutic drug level monitoring: Secondary | ICD-10-CM

## 2020-09-14 DIAGNOSIS — D6862 Lupus anticoagulant syndrome: Secondary | ICD-10-CM | POA: Insufficient documentation

## 2020-09-14 DIAGNOSIS — C8339 Diffuse large B-cell lymphoma, extranodal and solid organ sites: Secondary | ICD-10-CM

## 2020-09-14 DIAGNOSIS — N1832 Chronic kidney disease, stage 3b: Secondary | ICD-10-CM | POA: Diagnosis not present

## 2020-09-14 DIAGNOSIS — Z806 Family history of leukemia: Secondary | ICD-10-CM | POA: Insufficient documentation

## 2020-09-14 DIAGNOSIS — Z87442 Personal history of urinary calculi: Secondary | ICD-10-CM | POA: Diagnosis not present

## 2020-09-14 DIAGNOSIS — Z5112 Encounter for antineoplastic immunotherapy: Secondary | ICD-10-CM | POA: Diagnosis not present

## 2020-09-14 DIAGNOSIS — R519 Headache, unspecified: Secondary | ICD-10-CM | POA: Insufficient documentation

## 2020-09-14 DIAGNOSIS — Z8042 Family history of malignant neoplasm of prostate: Secondary | ICD-10-CM | POA: Insufficient documentation

## 2020-09-14 DIAGNOSIS — Z7901 Long term (current) use of anticoagulants: Secondary | ICD-10-CM | POA: Diagnosis not present

## 2020-09-14 DIAGNOSIS — Z79899 Other long term (current) drug therapy: Secondary | ICD-10-CM | POA: Insufficient documentation

## 2020-09-14 DIAGNOSIS — Z5189 Encounter for other specified aftercare: Secondary | ICD-10-CM | POA: Insufficient documentation

## 2020-09-14 DIAGNOSIS — Z800 Family history of malignant neoplasm of digestive organs: Secondary | ICD-10-CM | POA: Insufficient documentation

## 2020-09-14 DIAGNOSIS — Z79620 Long term (current) use of immunosuppressive biologic: Secondary | ICD-10-CM

## 2020-09-14 DIAGNOSIS — Z885 Allergy status to narcotic agent status: Secondary | ICD-10-CM | POA: Insufficient documentation

## 2020-09-14 DIAGNOSIS — Z5111 Encounter for antineoplastic chemotherapy: Secondary | ICD-10-CM | POA: Diagnosis not present

## 2020-09-14 DIAGNOSIS — C884 Extranodal marginal zone B-cell lymphoma of mucosa-associated lymphoid tissue [MALT-lymphoma]: Secondary | ICD-10-CM | POA: Diagnosis present

## 2020-09-14 DIAGNOSIS — Z87891 Personal history of nicotine dependence: Secondary | ICD-10-CM | POA: Diagnosis not present

## 2020-09-14 LAB — CBC WITH DIFFERENTIAL/PLATELET
Abs Immature Granulocytes: 0.05 10*3/uL (ref 0.00–0.07)
Basophils Absolute: 0.1 10*3/uL (ref 0.0–0.1)
Basophils Relative: 1 %
Eosinophils Absolute: 0 10*3/uL (ref 0.0–0.5)
Eosinophils Relative: 0 %
HCT: 42.3 % (ref 39.0–52.0)
Hemoglobin: 14.1 g/dL (ref 13.0–17.0)
Immature Granulocytes: 1 %
Lymphocytes Relative: 18 %
Lymphs Abs: 1.4 10*3/uL (ref 0.7–4.0)
MCH: 28.5 pg (ref 26.0–34.0)
MCHC: 33.3 g/dL (ref 30.0–36.0)
MCV: 85.6 fL (ref 80.0–100.0)
Monocytes Absolute: 0.9 10*3/uL (ref 0.1–1.0)
Monocytes Relative: 12 %
Neutro Abs: 5.3 10*3/uL (ref 1.7–7.7)
Neutrophils Relative %: 68 %
Platelets: 324 10*3/uL (ref 150–400)
RBC: 4.94 MIL/uL (ref 4.22–5.81)
RDW: 14.3 % (ref 11.5–15.5)
WBC: 7.8 10*3/uL (ref 4.0–10.5)
nRBC: 0 % (ref 0.0–0.2)

## 2020-09-14 LAB — COMPREHENSIVE METABOLIC PANEL
ALT: 43 U/L (ref 0–44)
AST: 31 U/L (ref 15–41)
Albumin: 3.7 g/dL (ref 3.5–5.0)
Alkaline Phosphatase: 54 U/L (ref 38–126)
Anion gap: 11 (ref 5–15)
BUN: 21 mg/dL (ref 8–23)
CO2: 24 mmol/L (ref 22–32)
Calcium: 9.1 mg/dL (ref 8.9–10.3)
Chloride: 102 mmol/L (ref 98–111)
Creatinine, Ser: 1.55 mg/dL — ABNORMAL HIGH (ref 0.61–1.24)
GFR, Estimated: 50 mL/min — ABNORMAL LOW (ref >60)
Glucose, Bld: 142 mg/dL — ABNORMAL HIGH (ref 70–99)
Potassium: 3.9 mmol/L (ref 3.5–5.1)
Sodium: 137 mmol/L (ref 135–145)
Total Bilirubin: 0.6 mg/dL (ref 0.3–1.2)
Total Protein: 6.6 g/dL (ref 6.5–8.1)

## 2020-09-14 LAB — LACTATE DEHYDROGENASE: LDH: 137 U/L (ref 98–192)

## 2020-09-14 MED ORDER — ACETAMINOPHEN 325 MG PO TABS
650.0000 mg | ORAL_TABLET | Freq: Once | ORAL | Status: AC
  Administered 2020-09-14: 650 mg via ORAL
  Filled 2020-09-14: qty 2

## 2020-09-14 MED ORDER — SODIUM CHLORIDE 0.9 % IV SOLN
375.0000 mg/m2 | Freq: Once | INTRAVENOUS | Status: AC
  Administered 2020-09-14: 900 mg via INTRAVENOUS
  Filled 2020-09-14: qty 50

## 2020-09-14 MED ORDER — SODIUM CHLORIDE 0.9 % IV SOLN
150.0000 mg | Freq: Once | INTRAVENOUS | Status: AC
  Administered 2020-09-14: 150 mg via INTRAVENOUS
  Filled 2020-09-14: qty 150

## 2020-09-14 MED ORDER — DOXORUBICIN HCL CHEMO IV INJECTION 2 MG/ML
50.0000 mg/m2 | Freq: Once | INTRAVENOUS | Status: AC
  Administered 2020-09-14: 116 mg via INTRAVENOUS
  Filled 2020-09-14: qty 50

## 2020-09-14 MED ORDER — SODIUM CHLORIDE 0.9 % IV SOLN
10.0000 mg | Freq: Once | INTRAVENOUS | Status: AC
  Administered 2020-09-14: 10 mg via INTRAVENOUS
  Filled 2020-09-14: qty 10

## 2020-09-14 MED ORDER — DIPHENHYDRAMINE HCL 25 MG PO CAPS
50.0000 mg | ORAL_CAPSULE | Freq: Once | ORAL | Status: AC
  Administered 2020-09-14: 50 mg via ORAL
  Filled 2020-09-14: qty 2

## 2020-09-14 MED ORDER — HEPARIN SOD (PORK) LOCK FLUSH 100 UNIT/ML IV SOLN
500.0000 [IU] | Freq: Once | INTRAVENOUS | Status: AC | PRN
  Administered 2020-09-14: 500 [IU]
  Filled 2020-09-14: qty 5

## 2020-09-14 MED ORDER — VINCRISTINE SULFATE CHEMO INJECTION 1 MG/ML
2.0000 mg | Freq: Once | INTRAVENOUS | Status: AC
  Administered 2020-09-14: 2 mg via INTRAVENOUS
  Filled 2020-09-14: qty 2

## 2020-09-14 MED ORDER — SODIUM CHLORIDE 0.9 % IV SOLN
750.0000 mg/m2 | Freq: Once | INTRAVENOUS | Status: AC
  Administered 2020-09-14: 1740 mg via INTRAVENOUS
  Filled 2020-09-14: qty 50

## 2020-09-14 MED ORDER — PALONOSETRON HCL INJECTION 0.25 MG/5ML
0.2500 mg | Freq: Once | INTRAVENOUS | Status: AC
  Administered 2020-09-14: 0.25 mg via INTRAVENOUS
  Filled 2020-09-14: qty 5

## 2020-09-14 MED ORDER — PEGFILGRASTIM 6 MG/0.6ML ~~LOC~~ PSKT
6.0000 mg | PREFILLED_SYRINGE | Freq: Once | SUBCUTANEOUS | Status: AC
  Administered 2020-09-14: 6 mg via SUBCUTANEOUS
  Filled 2020-09-14: qty 0.6

## 2020-09-14 MED ORDER — SODIUM CHLORIDE 0.9 % IV SOLN
Freq: Once | INTRAVENOUS | Status: AC
  Filled 2020-09-14: qty 250

## 2020-09-14 MED ORDER — SODIUM CHLORIDE 0.9 % IV SOLN: 375.0000 mg/m2 | Freq: Once | INTRAVENOUS | Status: DC

## 2020-09-14 NOTE — Progress Notes (Signed)
Pt has lost most of his hair, had HA and  It lasted for several hours and then took pain med 1 /2 tabl and repeat and then it went away. Some metal taste for a few days and then it goes away

## 2020-09-14 NOTE — Progress Notes (Signed)
Hematology/Oncology Consult note [facility]  [phone] [phone]  Patient Care Team: Rusty Aus, MD as PCP - General (Internal Medicine) Sindy Guadeloupe, MD as Consulting Physician (Oncology)   Name of the patient: Eddie Ryan  [id number]  13-Feb-1956   Date of visit: 09/14/20  Diagnosis- stage I E high-grade B-cell lymphoma  Chief complaint/ Reason for visit-on treatment assessment prior to cycle two of R-CHOP chemotherapy  Heme/Onc history: Patient is a 65 year old male with a prior history of DVT about 15 years ago and was diagnosed with antiphospholipid antibody syndrome with a positive lupus anticoagulant. He has remained on Coumadin since then. He has been seeing Dr. Erlene Quan for kidney stones. He had a CT abdomen and pelvis in August 2021 which showed thickening of the urothelium concerning for a urothelial lesion. He underwent a cystoscopy and ureteroscopy in September 2021 which did not show any intraluminal pathology. Plan was to get a repeat CT urogram 6 weeks following that. He had a CT hematuria work-up again in November 2021 which showed enhancing soft tissue lesion in the proximal right ureter measuring 2.3 x 2.3 x 3.5 cm which was extending for 2.6 cm and enlarges compared to prior size of 1.5 x 1.1 x 2.9 cm. There is no evidence of retroperitoneal adenopathy. Left external iliac node was 1.2 cm and was unchanged as compared to prior CT scan. He underwent a repeat ureteroscopy with stent placement.There was no obvious filling defect but there was a slight narrowing of ureter and patient therefore underwent a CT-guided biopsy by IR  Biopsy showed B-cell lymphomaHigh-grade with a Ki-67 of greater than 90%.Comment:  Core biopsy sections display lymphoid tissue. There are aggregates  comprised of larger lymphocytes, interspersed with areas of small mature  lymphocytes, and focal background bands of fibrosis. Typical  follicular  architecture is not present.   Immunohistochemical studies show strong and diffuse positivity for CD20,  indicating a B cell proliferation, comprised of both smaller and larger  lymphocytes. CD3 marks scattered background T cells. The subset of  larger B cells shows positivity for CD10, BCL6, and MUM-1 (partial).  These cells are negative for BCL2, Cyclin-D1, and CD30. BCL2 appears to  show marking in the subset of smaller lymphocytes, as well as normal T  cells. Ki-67 is significantly elevated in the subset of larger B cells,  estimated at greater than 90% staining. C-myc shows dim staining in a  portion of the larger B cells, approximately 20-30%. Pancytokeratin and  CD56 are negative.   Flow cytometric studies detect an abnormal CD10+ B cell population in a  background of polytypic B cells. These CD10+ B cells represent 28% of B  cells and show very dim kappa light chain restriction, to negative light  chain expression. By light scatter, these B cells appear approximately  the same size as background T cells. There is no loss of, or aberrant  expression of, pan T-cell antigens to suggest a neoplastic T cell  process. Cell viability in this sample is sufficient for analysis, but  less than optimal.   Overall classification of this process is limited due to the small size  of core biopsy samples. The histologic and immunophenotypic findings are  compatible with involvement by a B cell lymphoma. CD10 expression, as  seen by immunohistochemistry and flow cytometry, may imply  germinal/follicular center origin of these B cells. Absence of staining  for BCL2 could suggest against follicular lymphoma, although BCL2  expression  may be absent in 25-50% of cases of grade 3 follicular  lymphoma. The differential diagnosis may also include diffuse large B  cell lymphoma, given the increased proportion of larger abnormal B  cells. Further subclassification of this process would be  dependent upon  excisional biopsy or review of overall lesional architecture. There is  sufficient tissue for ancillary FISH or NGS testing, if desired.   Bone marrow biopsy was negative for lymphoma. PET/CT scan showed:A 3.3 x 2.7 cm soft tissue lesion around the right proximal ureter with an SUV of 13.4. There was no other hypermetabolic abdominopelvic adenopathy noted. No hypermetabolism noted in the liver or spleen or other organs. No intrathoracic adenopathy. No hypermetabolic activity noted in the bones.   Interval history-patient reports doing well and denies any complaints at this time.  He had some mild self-limited headaches at the last cycle.  ECOG PS- 0 Pain scale- 0   Review of systems- Review of Systems  Constitutional: Negative for chills, fever, malaise/fatigue and weight loss.  HENT: Negative for congestion, ear discharge and nosebleeds.   Eyes: Negative for blurred vision.  Respiratory: Negative for cough, hemoptysis, sputum production, shortness of breath and wheezing.   Cardiovascular: Negative for chest pain, palpitations, orthopnea and claudication.  Gastrointestinal: Negative for abdominal pain, blood in stool, constipation, diarrhea, heartburn, melena, nausea and vomiting.  Genitourinary: Negative for dysuria, flank pain, frequency, hematuria and urgency.  Musculoskeletal: Negative for back pain, joint pain and myalgias.  Skin: Negative for rash.  Neurological: Negative for dizziness, tingling, focal weakness, seizures, weakness and headaches.  Endo/Heme/Allergies: Does not bruise/bleed easily.  Psychiatric/Behavioral: Negative for depression and suicidal ideas. The patient does not have insomnia.       Allergies  Allergen Reactions  . Losartan Potassium-Hctz Other (See Comments)    Unknown reaction.  . Morphine And Related Itching    WITH PROLONGED USE     Past Medical History:  Diagnosis Date  . Anxiety   . Collagen vascular disease ([place])   .  Depression   . Diabetes mellitus without complication ([place])   . DVT (deep venous thrombosis) ([place])   . History of kidney stones   . Hypertension   . Lymphoma ([place]) 07/2020  . Sleep apnea      Past Surgical History:  Procedure Laterality Date  . CERVICAL FUSION    . CYSTOSCOPY W/ RETROGRADES Left 04/26/2020   Procedure: CYSTOSCOPY WITH RETROGRADE PYELOGRAM;  Surgeon: Hollice Espy, MD;  Location: [facility] OR[facility];  Service: Urology;  Laterality: Left;  . CYSTOSCOPY WITH URETEROSCOPY AND STENT PLACEMENT Right 07/02/2020   Procedure: CYSTOSCOPY WITH URETEROSCOPY AND STENT PLACEMENT;  Surgeon: Hollice Espy, MD;  Location: [facility];  Service: Urology;  Laterality: Right;  . CYSTOSCOPY/URETEROSCOPY/HOLMIUM LASER/STENT PLACEMENT Right 04/26/2020   Procedure: CYSTOSCOPY/URETEROSCOPY/HOLMIUM LASER/STENT PLACEMENT;  Surgeon: Hollice Espy, MD;  Location: [facility] OR[facility];  Service: Urology;  Laterality: Right;  . IR IMAGING GUIDED PORT INSERTION  07/27/2020  . PLANTAR FASCIA SURGERY Bilateral   . ROTATOR CUFF REPAIR Left   . THUMB FUSION    . URETERAL BIOPSY Right 04/26/2020   Procedure: URETERAL BIOPSY with ablation;  Surgeon: Hollice Espy, MD;  Location: [facility] OR[facility];  Service: Urology;  Laterality: Right;  . URETEROSCOPY WITH HOLMIUM LASER LITHOTRIPSY      Social History   Socioeconomic History  . Marital status: Single    Spouse name: Not on file  . Number of children: 0  . Years of education: Not on file  . 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 Smoking status: Former Smoker    Types: Cigarettes    Quit date: 1981    Years since quitting: 41.1  . Smokeless tobacco: Never Used  Vaping Use  . Vaping Use: Never used  Substance and Sexual Activity  . Alcohol use: Never  . Drug use: Never  . Sexual activity: Not Currently  Other Topics Concern  . Not on file  Social History Narrative   Lives by  himself..   Social Determinants of Health   Financial Resource Strain: Not on file  Food Insecurity: Not on file  Transportation Needs: Not on file  Physical Activity: Not on file  Stress: Not on file  Social Connections: Not on file  Intimate Partner Violence: Not on file    Family History  Problem Relation Age of Onset  . Pancreatic cancer Father   . Prostate cancer Paternal Grandmother   . Leukemia Nephew      Current Outpatient Medications:  .  allopurinol (ZYLOPRIM) 300 MG tablet, Take 1 tablet (300 mg total) by mouth daily., Disp: 30 tablet, Rfl: 3 .  ALPRAZolam (XANAX) 0.5 MG tablet, Take 0.5 mg by mouth in the morning and at bedtime. , Disp: , Rfl:  .  buPROPion (WELLBUTRIN XL) 150 MG 24 hr tablet, Take 300 mg by mouth daily. , Disp: , Rfl:  .  Cyanocobalamin (VITAMIN B-12 PO), Take 1 capsule by mouth daily. Pure Encapsulations B12 Folate Capsules, Disp: , Rfl:  .  diclofenac Sodium (VOLTAREN) 1 % GEL, Apply 1 g topically 4 (four) times daily as needed (thumb as needed for pain.). , Disp: , Rfl:  .  docusate sodium (COLACE) 100 MG capsule, Take 200 mg by mouth daily., Disp: , Rfl:  .  ergocalciferol (VITAMIN D2) 1.25 MG (50000 UT) capsule, Take 50,000 Units by mouth every Monday. , Disp: , Rfl:  .  escitalopram (LEXAPRO) 20 MG tablet, Take 20 mg by mouth daily. , Disp: , Rfl:  .  etodolac (LODINE) 400 MG tablet, Take 400 mg by mouth daily with breakfast., Disp: , Rfl:  .  glipiZIDE (GLUCOTROL XL) 10 MG 24 hr tablet, Take 10 mg by mouth daily with breakfast., Disp: , Rfl:  .  lidocaine-prilocaine (EMLA) cream, Apply to affected area once, Disp: 30 g, Rfl: 3 .  loratadine (CLARITIN) 10 MG tablet, Take 10 mg by mouth daily. , Disp: , Rfl:  .  Magnesium 500 MG TABS, Take 1,000 mg by mouth daily., Disp: , Rfl:  .  meclizine (ANTIVERT) 25 MG tablet, Take 25 mg by mouth 2 (two) times daily as needed for dizziness., Disp: , Rfl:  .  ondansetron (ZOFRAN) 8 MG tablet, Take 1 tablet  (8 mg total) by mouth 2 (two) times daily as needed for refractory nausea / vomiting. Start on day 3 after cyclophosphamide chemotherapy. (Patient not taking: No sig reported), Disp: 30 tablet, Rfl: 1 .  oxyCODONE-acetaminophen (PERCOCET) 5-325 MG tablet, Take 1-2 tablets by mouth every 4 (four) hours as needed for moderate pain or severe pain., Disp: 20 tablet, Rfl: 0 .  predniSONE (DELTASONE) 20 MG tablet, Take 5 tablets (100 mg total) by mouth daily. Take with food on days 1-5 of chemotherapy., Disp: 25 tablet, Rfl: 1 .  prochlorperazine (COMPAZINE) 10 MG tablet, Take 1 tablet (10 mg total) by mouth every 6 (six) hours as needed (Nausea or vomiting). (Patient  not taking: No sig reported), Disp: 30 tablet, Rfl: 6 .  telmisartan (MICARDIS) 80 MG tablet, Take 40 mg by mouth every evening. , Disp: , Rfl:  .  warfarin (COUMADIN) 5 MG tablet, Take 5 mg by mouth every evening. Take 1.5 tablets (7.5 mg) by mouth on Saturdays in the evening & take 1 tablet (5 mg) by mouth on Sundays, Mondays, Tuesdays, Wednesdays, Thursdays & Fridays., Disp: , Rfl:   Physical exam:  Vitals:   09/14/20 0858  BP: 105/72  Pulse: 75  Resp: 16  Temp: 98.2 F (36.8 C)  TempSrc: Oral  Weight: 223 lb 14.4 oz (101.6 kg)  Height: 6' 2"  (1.88 m)   Physical Exam Eyes:     Extraocular Movements: EOM normal.  Cardiovascular:     Rate and Rhythm: Normal rate and regular rhythm.     Heart sounds: Normal heart sounds.  Pulmonary:     Effort: Pulmonary effort is normal.     Breath sounds: Normal breath sounds.  Skin:    General: Skin is warm and dry.  Neurological:     Mental Status: He is alert and oriented to person, place, and time.      CMP Latest Ref Rng & Units 09/14/2020  Glucose 70 - 99 mg/dL 142(H)  BUN 8 - 23 mg/dL 21  Creatinine 0.61 - 1.24 mg/dL 1.55(H)  Sodium 135 - 145 mmol/L 137  Potassium 3.5 - 5.1 mmol/L 3.9  Chloride 98 - 111 mmol/L 102  CO2 22 - 32 mmol/L 24  Calcium 8.9 - 10.3 mg/dL 9.1  Total  Protein 6.5 - 8.1 g/dL 6.6  Total Bilirubin 0.3 - 1.2 mg/dL 0.6  Alkaline Phos 38 - 126 U/L 54  AST 15 - 41 U/L 31  ALT 0 - 44 U/L 43   CBC Latest Ref Rng & Units 09/14/2020  WBC 4.0 - 10.5 K/uL 7.8  Hemoglobin 13.0 - 17.0 g/dL 14.1  Hematocrit 39.0 - 52.0 % 42.3  Platelets 150 - 400 K/uL 324      Assessment and plan- Patient is a 65 y.o. 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  IPI score 1.  He is here for on treatment assessment prior to cycle two of R-CHOP chemotherapy  Counts okay to proceed with cycle two of R-CHOP chemotherapy today with on pro-Neulasta support.  I will see him back in 3 weeks for cycle three.  Plan to repeat PET CT scan 5 weeks from now.  He is tolerating treatment well so far without any significant side effects.    Visit Diagnosis 1. Encounter for antineoplastic chemotherapy   2. Diffuse large B-cell lymphoma of extranodal site excluding spleen and other solid organs [facility])      Dr. Randa Evens, MD, MPH [facility] [phone] 09/14/2020 8:53 AM

## 2020-09-14 NOTE — Progress Notes (Signed)
Pt received prescribed treatment in clinic, pt stable at d/c. 

## 2020-10-05 ENCOUNTER — Encounter: Payer: Self-pay | Admitting: Oncology

## 2020-10-05 ENCOUNTER — Inpatient Hospital Stay: Payer: Commercial Managed Care - PPO

## 2020-10-05 ENCOUNTER — Other Ambulatory Visit: Payer: Self-pay | Admitting: *Deleted

## 2020-10-05 ENCOUNTER — Other Ambulatory Visit: Payer: Self-pay

## 2020-10-05 ENCOUNTER — Inpatient Hospital Stay: Payer: Commercial Managed Care - PPO | Admitting: Oncology

## 2020-10-05 VITALS — BP 119/80 | HR 72 | Temp 97.2°F | Resp 16 | Ht 74.0 in | Wt 223.5 lb

## 2020-10-05 VITALS — BP 119/71 | HR 81 | Temp 96.8°F | Resp 17

## 2020-10-05 DIAGNOSIS — N1832 Chronic kidney disease, stage 3b: Secondary | ICD-10-CM | POA: Diagnosis not present

## 2020-10-05 DIAGNOSIS — C8339 Diffuse large B-cell lymphoma, extranodal and solid organ sites: Secondary | ICD-10-CM | POA: Diagnosis not present

## 2020-10-05 DIAGNOSIS — Z5181 Encounter for therapeutic drug level monitoring: Secondary | ICD-10-CM | POA: Diagnosis not present

## 2020-10-05 DIAGNOSIS — Z5112 Encounter for antineoplastic immunotherapy: Secondary | ICD-10-CM | POA: Diagnosis not present

## 2020-10-05 DIAGNOSIS — Z79899 Other long term (current) drug therapy: Secondary | ICD-10-CM

## 2020-10-05 DIAGNOSIS — Z5111 Encounter for antineoplastic chemotherapy: Secondary | ICD-10-CM

## 2020-10-05 LAB — CBC WITH DIFFERENTIAL/PLATELET
Abs Immature Granulocytes: 0.03 10*3/uL (ref 0.00–0.07)
Basophils Absolute: 0.1 10*3/uL (ref 0.0–0.1)
Basophils Relative: 1 %
Eosinophils Absolute: 0 10*3/uL (ref 0.0–0.5)
Eosinophils Relative: 1 %
HCT: 40.9 % (ref 39.0–52.0)
Hemoglobin: 13.8 g/dL (ref 13.0–17.0)
Immature Granulocytes: 0 %
Lymphocytes Relative: 15 %
Lymphs Abs: 1.1 10*3/uL (ref 0.7–4.0)
MCH: 29.7 pg (ref 26.0–34.0)
MCHC: 33.7 g/dL (ref 30.0–36.0)
MCV: 88.1 fL (ref 80.0–100.0)
Monocytes Absolute: 0.9 10*3/uL (ref 0.1–1.0)
Monocytes Relative: 12 %
Neutro Abs: 5 10*3/uL (ref 1.7–7.7)
Neutrophils Relative %: 71 %
Platelets: 249 10*3/uL (ref 150–400)
RBC: 4.64 MIL/uL (ref 4.22–5.81)
RDW: 15.6 % — ABNORMAL HIGH (ref 11.5–15.5)
WBC: 7.2 10*3/uL (ref 4.0–10.5)
nRBC: 0 % (ref 0.0–0.2)

## 2020-10-05 LAB — COMPREHENSIVE METABOLIC PANEL
ALT: 37 U/L (ref 0–44)
AST: 31 U/L (ref 15–41)
Albumin: 3.7 g/dL (ref 3.5–5.0)
Alkaline Phosphatase: 45 U/L (ref 38–126)
Anion gap: 11 (ref 5–15)
BUN: 21 mg/dL (ref 8–23)
CO2: 26 mmol/L (ref 22–32)
Calcium: 9.1 mg/dL (ref 8.9–10.3)
Chloride: 100 mmol/L (ref 98–111)
Creatinine, Ser: 1.68 mg/dL — ABNORMAL HIGH (ref 0.61–1.24)
GFR, Estimated: 45 mL/min — ABNORMAL LOW (ref >60)
Glucose, Bld: 148 mg/dL — ABNORMAL HIGH (ref 70–99)
Potassium: 4.1 mmol/L (ref 3.5–5.1)
Sodium: 137 mmol/L (ref 135–145)
Total Bilirubin: 0.5 mg/dL (ref 0.3–1.2)
Total Protein: 6.5 g/dL (ref 6.5–8.1)

## 2020-10-05 MED ORDER — VINCRISTINE SULFATE CHEMO INJECTION 1 MG/ML
2.0000 mg | Freq: Once | INTRAVENOUS | Status: AC
  Administered 2020-10-05: 2 mg via INTRAVENOUS
  Filled 2020-10-05: qty 2

## 2020-10-05 MED ORDER — ACETAMINOPHEN 325 MG PO TABS
650.0000 mg | ORAL_TABLET | Freq: Once | ORAL | Status: AC
  Administered 2020-10-05: 650 mg via ORAL
  Filled 2020-10-05: qty 2

## 2020-10-05 MED ORDER — HEPARIN SOD (PORK) LOCK FLUSH 100 UNIT/ML IV SOLN
500.0000 [IU] | Freq: Once | INTRAVENOUS | Status: AC | PRN
  Administered 2020-10-05: 500 [IU]
  Filled 2020-10-05: qty 5

## 2020-10-05 MED ORDER — SODIUM CHLORIDE 0.9 % IV SOLN
10.0000 mg | Freq: Once | INTRAVENOUS | Status: AC
  Administered 2020-10-05: 10 mg via INTRAVENOUS
  Filled 2020-10-05: qty 10

## 2020-10-05 MED ORDER — SODIUM CHLORIDE 0.9 % IV SOLN
750.0000 mg/m2 | Freq: Once | INTRAVENOUS | Status: AC
  Administered 2020-10-05: 1740 mg via INTRAVENOUS
  Filled 2020-10-05: qty 87

## 2020-10-05 MED ORDER — SODIUM CHLORIDE 0.9 % IV SOLN: 375.0000 mg/m2 | Freq: Once | INTRAVENOUS | Status: DC

## 2020-10-05 MED ORDER — SODIUM CHLORIDE 0.9 % IV SOLN
375.0000 mg/m2 | Freq: Once | INTRAVENOUS | Status: AC
  Administered 2020-10-05: 900 mg via INTRAVENOUS
  Filled 2020-10-05: qty 50

## 2020-10-05 MED ORDER — SODIUM CHLORIDE 0.9 % IV SOLN
150.0000 mg | Freq: Once | INTRAVENOUS | Status: AC
  Administered 2020-10-05: 150 mg via INTRAVENOUS
  Filled 2020-10-05: qty 5

## 2020-10-05 MED ORDER — PALONOSETRON HCL INJECTION 0.25 MG/5ML
0.2500 mg | Freq: Once | INTRAVENOUS | Status: AC
  Administered 2020-10-05: 0.25 mg via INTRAVENOUS
  Filled 2020-10-05: qty 5

## 2020-10-05 MED ORDER — PREDNISONE 20 MG PO TABS: 100.0000 mg | ORAL_TABLET | Freq: Every day | ORAL | 0 refills | Status: DC

## 2020-10-05 MED ORDER — DOXORUBICIN HCL CHEMO IV INJECTION 2 MG/ML
50.0000 mg/m2 | Freq: Once | INTRAVENOUS | Status: AC
  Administered 2020-10-05: 116 mg via INTRAVENOUS
  Filled 2020-10-05: qty 50

## 2020-10-05 MED ORDER — PEGFILGRASTIM 6 MG/0.6ML ~~LOC~~ PSKT
6.0000 mg | PREFILLED_SYRINGE | Freq: Once | SUBCUTANEOUS | Status: AC
  Administered 2020-10-05: 6 mg via SUBCUTANEOUS
  Filled 2020-10-05: qty 0.6

## 2020-10-05 MED ORDER — SODIUM CHLORIDE 0.9 % IV SOLN
Freq: Once | INTRAVENOUS | Status: AC
  Filled 2020-10-05: qty 250

## 2020-10-05 MED ORDER — DIPHENHYDRAMINE HCL 25 MG PO CAPS
50.0000 mg | ORAL_CAPSULE | Freq: Once | ORAL | Status: AC
  Administered 2020-10-05: 50 mg via ORAL
  Filled 2020-10-05: qty 2

## 2020-10-05 NOTE — Progress Notes (Signed)
Hematology/Oncology Consult note [facility]  [phone] [phone]  Patient Care Team: Rusty Aus, MD as PCP - General (Internal Medicine) Sindy Guadeloupe, MD as Consulting Physician (Oncology)   Name of the patient: Eddie Ryan  [id number]  [birth year]   Date of visit: 10/05/20  Diagnosis- stage I E high-grade B-cell lymphoma  Chief complaint/ Reason for visit-on treatment assessment prior to cycle 3 of R-CHOP chemotherapy  Heme/Onc history: Patient is a 65 year old male with a prior history of DVT about 15 years ago and was diagnosed with antiphospholipid antibody syndrome with a positive lupus anticoagulant. He has remained on Coumadin since then. He has been seeing Dr. Erlene Quan for kidney stones. He had a CT abdomen and pelvis in August 2021 which showed thickening of the urothelium concerning for a urothelial lesion. He underwent a cystoscopy and ureteroscopy in September 2021 which did not show any intraluminal pathology. Plan was to get a repeat CT urogram 6 weeks following that. He had a CT hematuria work-up again in November 2021 which showed enhancing soft tissue lesion in the proximal right ureter measuring 2.3 x 2.3 x 3.5 cm which was extending for 2.6 cm and enlarges compared to prior size of 1.5 x 1.1 x 2.9 cm. There is no evidence of retroperitoneal adenopathy. Left external iliac node was 1.2 cm and was unchanged as compared to prior CT scan. He underwent a repeat ureteroscopy with stent placement.There was no obvious filling defect but there was a slight narrowing of ureter and patient therefore underwent a CT-guided biopsy by IR  Biopsy showed B-cell lymphomaHigh-grade with a Ki-67 of greater than 90%.Comment:  Core biopsy sections display lymphoid tissue. There are aggregates  comprised of larger lymphocytes, interspersed with areas of small mature  lymphocytes, and focal background bands of fibrosis. Typical  follicular  architecture is not present.   Immunohistochemical studies show strong and diffuse positivity for CD20,  indicating a B cell proliferation, comprised of both smaller and larger  lymphocytes. CD3 marks scattered background T cells. The subset of  larger B cells shows positivity for CD10, BCL6, and MUM-1 (partial).  These cells are negative for BCL2, Cyclin-D1, and CD30. BCL2 appears to  show marking in the subset of smaller lymphocytes, as well as normal T  cells. Ki-67 is significantly elevated in the subset of larger B cells,  estimated at greater than 90% staining. C-myc shows dim staining in a  portion of the larger B cells, approximately 20-30%. Pancytokeratin and  CD56 are negative.   Flow cytometric studies detect an abnormal CD10+ B cell population in a  background of polytypic B cells. These CD10+ B cells represent 28% of B  cells and show very dim kappa light chain restriction, to negative light  chain expression. By light scatter, these B cells appear approximately  the same size as background T cells. There is no loss of, or aberrant  expression of, pan T-cell antigens to suggest a neoplastic T cell  process. Cell viability in this sample is sufficient for analysis, but  less than optimal.   Overall classification of this process is limited due to the small size  of core biopsy samples. The histologic and immunophenotypic findings are  compatible with involvement by a B cell lymphoma. CD10 expression, as  seen by immunohistochemistry and flow cytometry, may imply  germinal/follicular center origin of these B cells. Absence of staining  for BCL2 could suggest against follicular lymphoma, although BCL2  expression  may be absent in 25-50% of cases of grade 3 follicular  lymphoma. The differential diagnosis may also include diffuse large B  cell lymphoma, given the increased proportion of larger abnormal B  cells. Further subclassification of this process would be  dependent upon  excisional biopsy or review of overall lesional architecture. There is  sufficient tissue for ancillary FISH or NGS testing, if desired.   Bone marrow biopsy was negative for lymphoma. PET/CT scan showed:A 3.3 x 2.7 cm soft tissue lesion around the right proximal ureter with an SUV of 13.4. There was no other hypermetabolic abdominopelvic adenopathy noted. No hypermetabolism noted in the liver or spleen or other organs. No intrathoracic adenopathy. No hypermetabolic activity noted in the bones.   Interval history-patient does report ongoing fatigue with chemotherapy which lasts for about 2 weeks.  Also reports Neulasta associated bone pain for which he has been using as needed Percocet.  Denies any tingling numbness in his hands and feet.  Denies any significant nausea or vomiting  ECOG PS- 1 Pain scale- 0   Review of systems- Review of Systems  Constitutional: Positive for malaise/fatigue. Negative for chills, fever and weight loss.  HENT: Negative for congestion, ear discharge and nosebleeds.   Eyes: Negative for blurred vision.  Respiratory: Negative for cough, hemoptysis, sputum production, shortness of breath and wheezing.   Cardiovascular: Negative for chest pain, palpitations, orthopnea and claudication.  Gastrointestinal: Negative for abdominal pain, blood in stool, constipation, diarrhea, heartburn, melena, nausea and vomiting.  Genitourinary: Negative for dysuria, flank pain, frequency, hematuria and urgency.  Musculoskeletal: Negative for back pain, joint pain and myalgias.  Skin: Negative for rash.  Neurological: Negative for dizziness, tingling, focal weakness, seizures, weakness and headaches.  Endo/Heme/Allergies: Does not bruise/bleed easily.  Psychiatric/Behavioral: Negative for depression and suicidal ideas. The patient does not have insomnia.        Allergies  Allergen Reactions  . Losartan Potassium-Hctz Other (See Comments)    Unknown  reaction.  . Morphine And Related Itching    WITH PROLONGED USE     Past Medical History:  Diagnosis Date  . Anxiety   . Collagen vascular disease ([place])   . Depression   . Diabetes mellitus without complication ([place])   . DVT (deep venous thrombosis) ([place])   . History of kidney stones   . Hypertension   . Lymphoma ([place]) 07/2020  . Sleep apnea      Past Surgical History:  Procedure Laterality Date  . CERVICAL FUSION    . CYSTOSCOPY W/ RETROGRADES Left 04/26/2020   Procedure: CYSTOSCOPY WITH RETROGRADE PYELOGRAM;  Surgeon: Hollice Espy, MD;  Location: [facility];  Service: Urology;  Laterality: Left;  . CYSTOSCOPY WITH URETEROSCOPY AND STENT PLACEMENT Right 07/02/2020   Procedure: CYSTOSCOPY WITH URETEROSCOPY AND STENT PLACEMENT;  Surgeon: Hollice Espy, MD;  Location: [facility];  Service: Urology;  Laterality: Right;  . CYSTOSCOPY/URETEROSCOPY/HOLMIUM LASER/STENT PLACEMENT Right 04/26/2020   Procedure: CYSTOSCOPY/URETEROSCOPY/HOLMIUM LASER/STENT PLACEMENT;  Surgeon: Hollice Espy, MD;  Location: [facility];  Service: Urology;  Laterality: Right;  . IR IMAGING GUIDED PORT INSERTION  07/27/2020  . PLANTAR FASCIA SURGERY Bilateral   . ROTATOR CUFF REPAIR Left   . THUMB FUSION    . URETERAL BIOPSY Right 04/26/2020   Procedure: URETERAL BIOPSY with ablation;  Surgeon: Hollice Espy, MD;  Location: [facility];  Service: Urology;  Laterality: Right;  . URETEROSCOPY WITH HOLMIUM LASER LITHOTRIPSY      Social History   Socioeconomic History  . Marital status:  Single    Spouse name: Not on file  . Number of children: 0  . Years of education: Not on file  . 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 Smoking status: Former Smoker    Types: Cigarettes    Quit date: 1981    Years since quitting: 41.1  . Smokeless tobacco: Never Used  Vaping Use  . Vaping Use: Never used  Substance and Sexual  Activity  . Alcohol use: Never  . Drug use: Never  . Sexual activity: Not Currently  Other Topics Concern  . Not on file  Social History Narrative   Lives by himself..   Social Determinants of Health   Financial Resource Strain: Not on file  Food Insecurity: Not on file  Transportation Needs: Not on file  Physical Activity: Not on file  Stress: Not on file  Social Connections: Not on file  Intimate Partner Violence: Not on file    Family History  Problem Relation Age of Onset  . Pancreatic cancer Father   . Prostate cancer Paternal Grandmother   . Leukemia Nephew      Current Outpatient Medications:  .  allopurinol (ZYLOPRIM) 300 MG tablet, Take 1 tablet (300 mg total) by mouth daily., Disp: 30 tablet, Rfl: 3 .  ALPRAZolam (XANAX) 0.5 MG tablet, Take 0.5 mg by mouth in the morning and at bedtime. , Disp: , Rfl:  .  buPROPion (WELLBUTRIN XL) 150 MG 24 hr tablet, Take 300 mg by mouth daily. , Disp: , Rfl:  .  Cyanocobalamin (VITAMIN B-12 PO), Take 1 capsule by mouth daily. Pure Encapsulations B12 Folate Capsules, Disp: , Rfl:  .  diclofenac Sodium (VOLTAREN) 1 % GEL, Apply 1 g topically 4 (four) times daily as needed (thumb as needed for pain.). , Disp: , Rfl:  .  docusate sodium (COLACE) 100 MG capsule, Take 200 mg by mouth daily., Disp: , Rfl:  .  ergocalciferol (VITAMIN D2) 1.25 MG (50000 UT) capsule, Take 50,000 Units by mouth every Monday. , Disp: , Rfl:  .  escitalopram (LEXAPRO) 20 MG tablet, Take 20 mg by mouth daily. , Disp: , Rfl:  .  etodolac (LODINE) 400 MG tablet, Take 400 mg by mouth daily with breakfast., Disp: , Rfl:  .  glipiZIDE (GLUCOTROL XL) 10 MG 24 hr tablet, Take 10 mg by mouth daily with breakfast., Disp: , Rfl:  .  lidocaine-prilocaine (EMLA) cream, Apply to affected area once, Disp: 30 g, Rfl: 3 .  loratadine (CLARITIN) 10 MG tablet, Take 10 mg by mouth daily. , Disp: , Rfl:  .  Magnesium 500 MG TABS, Take 1,000 mg by mouth daily., Disp: , Rfl:  .   meclizine (ANTIVERT) 25 MG tablet, Take 25 mg by mouth 2 (two) times daily as needed for dizziness., Disp: , Rfl:  .  oxyCODONE-acetaminophen (PERCOCET) 5-325 MG tablet, Take 1-2 tablets by mouth every 4 (four) hours as needed for moderate pain or severe pain., Disp: 20 tablet, Rfl: 0 .  predniSONE (DELTASONE) 20 MG tablet, Take 5 tablets (100 mg total) by mouth daily. Take with food on days 1-5 of chemotherapy., Disp: 25 tablet, Rfl: 1 .  telmisartan (MICARDIS) 80 MG tablet, Take 40 mg by mouth every evening. , Disp: , Rfl:  .  warfarin (COUMADIN) 5 MG tablet, Take 5 mg by mouth every evening. Take 1.5 tablets (7.5 mg) by mouth on Saturdays  in the evening & take 1 tablet (5 mg) by mouth on Sundays, Mondays, Tuesdays, Wednesdays, Thursdays & Fridays., Disp: , Rfl:  .  ondansetron (ZOFRAN) 8 MG tablet, Take 1 tablet (8 mg total) by mouth 2 (two) times daily as needed for refractory nausea / vomiting. Start on day 3 after cyclophosphamide chemotherapy. (Patient not taking: No sig reported), Disp: 30 tablet, Rfl: 1 .  prochlorperazine (COMPAZINE) 10 MG tablet, Take 1 tablet (10 mg total) by mouth every 6 (six) hours as needed (Nausea or vomiting). (Patient not taking: No sig reported), Disp: 30 tablet, Rfl: 6  Physical exam:  Vitals:   10/05/20 0905  BP: 119/80  Pulse: 72  Resp: 16  Temp: (!) 97.2 F (36.2 C)  TempSrc: Tympanic  Weight: 223 lb 8 oz (101.4 kg)  Height: 6' 2"  (1.88 m)   Physical Exam Constitutional:      General: He is not in acute distress. Eyes:     Extraocular Movements: EOM normal.  Pulmonary:     Effort: Pulmonary effort is normal.  Skin:    General: Skin is warm and dry.  Neurological:     Mental Status: He is alert and oriented to person, place, and time.      CMP Latest Ref Rng & Units 10/05/2020  Glucose 70 - 99 mg/dL 148(H)  BUN 8 - 23 mg/dL 21  Creatinine 0.61 - 1.24 mg/dL 1.68(H)  Sodium 135 - 145 mmol/L 137  Potassium 3.5 - 5.1 mmol/L 4.1  Chloride 98 -  111 mmol/L 100  CO2 22 - 32 mmol/L 26  Calcium 8.9 - 10.3 mg/dL 9.1  Total Protein 6.5 - 8.1 g/dL 6.5  Total Bilirubin 0.3 - 1.2 mg/dL 0.5  Alkaline Phos 38 - 126 U/L 45  AST 15 - 41 U/L 31  ALT 0 - 44 U/L 37   CBC Latest Ref Rng & Units 10/05/2020  WBC 4.0 - 10.5 K/uL 7.2  Hemoglobin 13.0 - 17.0 g/dL 13.8  Hematocrit 39.0 - 52.0 % 40.9  Platelets 150 - 400 K/uL 249      Assessment and plan- Patient is a 65 y.o. male with stage IE high-grade B-cell lymphoma here for on treatment assessment prior to cycle 3 of R-CHOP chemotherapy  Counts okay to proceed with cycle 3 of R-CHOP chemotherapy today with on pro-Neulasta support.  Repeat PET CT scan in 2 weeks and see me back in 3 weeks.  I will consider stopping chemotherapy after 3 cycles if he is in complete remission and refer him to radiation therapy at that time.  We will refill his oxycodone for Neulasta associated bone pain as well as prednisone which she will be taking for 5 days 100 mg daily.   CKD and his creatinine is stable between 1.3-1.6.  Continue to monitor and have encouraged him to drink more fluids 40 to 50 ounces daily.    Visit Diagnosis 1. Encounter for antineoplastic chemotherapy   2. Encounter for monitoring rituximab therapy   3. Stage 3b chronic kidney disease ([place])      Dr. Randa Evens, MD, MPH [facility] at [facility] [phone] 10/05/2020 8:59 AM

## 2020-10-05 NOTE — Addendum Note (Signed)
Addended by: Luella Cook on: 10/05/2020 04:38 PM   Modules accepted: Orders

## 2020-10-05 NOTE — Progress Notes (Signed)
Pt doing ok, he dose have a lot of pain with the onpro neulasta. He feels he is not drinking as much as he should but will try better in future.

## 2020-10-05 NOTE — Progress Notes (Signed)
0920: Creatinine 1.68, Per Dr. Janese Banks okay to proceed with scheduled treatment at this time.

## 2020-10-08 MED ORDER — OXYCODONE HCL 5 MG PO CAPS: 5.0000 mg | ORAL_CAPSULE | Freq: Four times a day (QID) | ORAL | 0 refills | Status: DC | PRN

## 2020-10-18 ENCOUNTER — Ambulatory Visit
Admission: RE | Admit: 2020-10-18 | Discharge: 2020-10-18 | Disposition: A | Discharge status: Home / Self Care | Payer: Commercial Managed Care - PPO | Source: Ambulatory Visit | Attending: Oncology | Admitting: Oncology

## 2020-10-18 ENCOUNTER — Other Ambulatory Visit: Payer: Self-pay

## 2020-10-18 DIAGNOSIS — Z5111 Encounter for antineoplastic chemotherapy: Secondary | ICD-10-CM

## 2020-10-18 DIAGNOSIS — C8339 Diffuse large B-cell lymphoma, extranodal and solid organ sites: Secondary | ICD-10-CM | POA: Diagnosis present

## 2020-10-18 LAB — GLUCOSE, CAPILLARY: Glucose-Capillary: 132 mg/dL — ABNORMAL HIGH (ref 70–99)

## 2020-10-18 MED ORDER — FLUDEOXYGLUCOSE F - 18 (FDG) INJECTION
11.6000 | Freq: Once | INTRAVENOUS | Status: AC | PRN
  Administered 2020-10-18: 12.13 via INTRAVENOUS

## 2020-10-29 ENCOUNTER — Other Ambulatory Visit: Payer: Self-pay | Admitting: Oncology

## 2020-10-29 ENCOUNTER — Other Ambulatory Visit: Payer: Self-pay

## 2020-10-29 ENCOUNTER — Inpatient Hospital Stay: Payer: Commercial Managed Care - PPO

## 2020-10-29 ENCOUNTER — Inpatient Hospital Stay: Payer: Commercial Managed Care - PPO | Attending: Oncology | Admitting: Oncology

## 2020-10-29 ENCOUNTER — Encounter: Payer: Self-pay | Admitting: Oncology

## 2020-10-29 VITALS — BP 125/74 | HR 77 | Temp 96.2°F | Resp 20 | Wt 225.6 lb

## 2020-10-29 DIAGNOSIS — Z885 Allergy status to narcotic agent status: Secondary | ICD-10-CM | POA: Insufficient documentation

## 2020-10-29 DIAGNOSIS — R5383 Other fatigue: Secondary | ICD-10-CM | POA: Insufficient documentation

## 2020-10-29 DIAGNOSIS — N200 Calculus of kidney: Secondary | ICD-10-CM | POA: Diagnosis not present

## 2020-10-29 DIAGNOSIS — T451X5A Adverse effect of antineoplastic and immunosuppressive drugs, initial encounter: Secondary | ICD-10-CM | POA: Diagnosis not present

## 2020-10-29 DIAGNOSIS — Z7901 Long term (current) use of anticoagulants: Secondary | ICD-10-CM | POA: Diagnosis not present

## 2020-10-29 DIAGNOSIS — G629 Polyneuropathy, unspecified: Secondary | ICD-10-CM | POA: Diagnosis not present

## 2020-10-29 DIAGNOSIS — Z8042 Family history of malignant neoplasm of prostate: Secondary | ICD-10-CM | POA: Diagnosis not present

## 2020-10-29 DIAGNOSIS — Z86718 Personal history of other venous thrombosis and embolism: Secondary | ICD-10-CM | POA: Insufficient documentation

## 2020-10-29 DIAGNOSIS — Z79899 Other long term (current) drug therapy: Secondary | ICD-10-CM | POA: Diagnosis not present

## 2020-10-29 DIAGNOSIS — Z800 Family history of malignant neoplasm of digestive organs: Secondary | ICD-10-CM | POA: Insufficient documentation

## 2020-10-29 DIAGNOSIS — G620 Drug-induced polyneuropathy: Secondary | ICD-10-CM | POA: Diagnosis not present

## 2020-10-29 DIAGNOSIS — Z806 Family history of leukemia: Secondary | ICD-10-CM | POA: Diagnosis not present

## 2020-10-29 DIAGNOSIS — Z87891 Personal history of nicotine dependence: Secondary | ICD-10-CM | POA: Diagnosis not present

## 2020-10-29 DIAGNOSIS — C8339 Diffuse large B-cell lymphoma, extranodal and solid organ sites: Secondary | ICD-10-CM | POA: Diagnosis not present

## 2020-10-29 DIAGNOSIS — Z87442 Personal history of urinary calculi: Secondary | ICD-10-CM | POA: Diagnosis not present

## 2020-10-29 DIAGNOSIS — D6862 Lupus anticoagulant syndrome: Secondary | ICD-10-CM | POA: Diagnosis not present

## 2020-10-29 DIAGNOSIS — Z7189 Other specified counseling: Secondary | ICD-10-CM | POA: Diagnosis not present

## 2020-10-29 LAB — CBC WITH DIFFERENTIAL/PLATELET
Abs Immature Granulocytes: 0.02 10*3/uL (ref 0.00–0.07)
Basophils Absolute: 0.1 10*3/uL (ref 0.0–0.1)
Basophils Relative: 1 %
Eosinophils Absolute: 0.1 10*3/uL (ref 0.0–0.5)
Eosinophils Relative: 1 %
HCT: 38 % — ABNORMAL LOW (ref 39.0–52.0)
Hemoglobin: 12.6 g/dL — ABNORMAL LOW (ref 13.0–17.0)
Immature Granulocytes: 0 %
Lymphocytes Relative: 16 %
Lymphs Abs: 0.8 10*3/uL (ref 0.7–4.0)
MCH: 29.3 pg (ref 26.0–34.0)
MCHC: 33.2 g/dL (ref 30.0–36.0)
MCV: 88.4 fL (ref 80.0–100.0)
Monocytes Absolute: 0.8 10*3/uL (ref 0.1–1.0)
Monocytes Relative: 16 %
Neutro Abs: 3.2 10*3/uL (ref 1.7–7.7)
Neutrophils Relative %: 66 %
Platelets: 209 10*3/uL (ref 150–400)
RBC: 4.3 MIL/uL (ref 4.22–5.81)
RDW: 17.2 % — ABNORMAL HIGH (ref 11.5–15.5)
WBC: 4.8 10*3/uL (ref 4.0–10.5)
nRBC: 0 % (ref 0.0–0.2)

## 2020-10-29 LAB — COMPREHENSIVE METABOLIC PANEL
ALT: 31 U/L (ref 0–44)
AST: 34 U/L (ref 15–41)
Albumin: 3.6 g/dL (ref 3.5–5.0)
Alkaline Phosphatase: 39 U/L (ref 38–126)
Anion gap: 12 (ref 5–15)
BUN: 18 mg/dL (ref 8–23)
CO2: 21 mmol/L — ABNORMAL LOW (ref 22–32)
Calcium: 8.7 mg/dL — ABNORMAL LOW (ref 8.9–10.3)
Chloride: 104 mmol/L (ref 98–111)
Creatinine, Ser: 1.46 mg/dL — ABNORMAL HIGH (ref 0.61–1.24)
GFR, Estimated: 54 mL/min — ABNORMAL LOW (ref >60)
Glucose, Bld: 232 mg/dL — ABNORMAL HIGH (ref 70–99)
Potassium: 4 mmol/L (ref 3.5–5.1)
Sodium: 137 mmol/L (ref 135–145)
Total Bilirubin: 0.6 mg/dL (ref 0.3–1.2)
Total Protein: 5.9 g/dL — ABNORMAL LOW (ref 6.5–8.1)

## 2020-10-29 NOTE — Progress Notes (Signed)
Hematology/Oncology Consult note [facility] ([facility])  [phone] [phone]  Patient Care Team: Rusty Aus, MD as PCP - General (Internal Medicine) Sindy Guadeloupe, MD as Consulting Physician (Oncology)   Name of the patient: Eddie Ryan  [id number]  03-02-1956   Date of visit: 10/29/20  Diagnosis- stage I E high-grade B-cell lymphoma  Chief complaint/ Reason for visit-discuss PET CT scan results and further management  Heme/Onc history: Patient is a 65 year old male with a prior history of DVT about 15 years ago and was diagnosed with antiphospholipid antibody syndrome with a positive lupus anticoagulant. He has remained on Coumadin since then. He has been seeing Dr. Erlene Quan for kidney stones. He had a CT abdomen and pelvis in August 2021 which showed thickening of the urothelium concerning for a urothelial lesion. He underwent a cystoscopy and ureteroscopy in September 2021 which did not show any intraluminal pathology. Plan was to get a repeat CT urogram 6 weeks following that. He had a CT hematuria work-up again in November 2021 which showed enhancing soft tissue lesion in the proximal right ureter measuring 2.3 x 2.3 x 3.5 cm which was extending for 2.6 cm and enlarges compared to prior size of 1.5 x 1.1 x 2.9 cm. There is no evidence of retroperitoneal adenopathy. Left external iliac node was 1.2 cm and was unchanged as compared to prior CT scan. He underwent a repeat ureteroscopy with stent placement.There was no obvious filling defect but there was a slight narrowing of ureter and patient therefore underwent a CT-guided biopsy by IR  Biopsy showed B-cell lymphomaHigh-grade with a Ki-67 of greater than 90%.Comment:  Core biopsy sections display lymphoid tissue. There are aggregates  comprised of larger lymphocytes, interspersed with areas of small mature  lymphocytes, and focal background bands of fibrosis. Typical follicular   architecture is not present.   Immunohistochemical studies show strong and diffuse positivity for CD20,  indicating a B cell proliferation, comprised of both smaller and larger  lymphocytes. CD3 marks scattered background T cells. The subset of  larger B cells shows positivity for CD10, BCL6, and MUM-1 (partial).  These cells are negative for BCL2, Cyclin-D1, and CD30. BCL2 appears to  show marking in the subset of smaller lymphocytes, as well as normal T  cells. Ki-67 is significantly elevated in the subset of larger B cells,  estimated at greater than 90% staining. C-myc shows dim staining in a  portion of the larger B cells, approximately 20-30%. Pancytokeratin and  CD56 are negative.   Flow cytometric studies detect an abnormal CD10+ B cell population in a  background of polytypic B cells. These CD10+ B cells represent 28% of B  cells and show very dim kappa light chain restriction, to negative light  chain expression. By light scatter, these B cells appear approximately  the same size as background T cells. There is no loss of, or aberrant  expression of, pan T-cell antigens to suggest a neoplastic T cell  process. Cell viability in this sample is sufficient for analysis, but  less than optimal.   Overall classification of this process is limited due to the small size  of core biopsy samples. The histologic and immunophenotypic findings are  compatible with involvement by a B cell lymphoma. CD10 expression, as  seen by immunohistochemistry and flow cytometry, may imply  germinal/follicular center origin of these B cells. Absence of staining  for BCL2 could suggest against follicular lymphoma, although BCL2  expression may be  absent in 25-50% of cases of grade 3 follicular  lymphoma. The differential diagnosis may also include diffuse large B  cell lymphoma, given the increased proportion of larger abnormal B  cells. Further subclassification of this process would be dependent  upon  excisional biopsy or review of overall lesional architecture. There is  sufficient tissue for ancillary FISH or NGS testing, if desired.   Bone marrow biopsy was negative for lymphoma. PET/CT scan showed:A 3.3 x 2.7 cm soft tissue lesion around the right proximal ureter with an SUV of 13.4. There was no other hypermetabolic abdominopelvic adenopathy noted. No hypermetabolism noted in the liver or spleen or other organs. No intrathoracic adenopathy. No hypermetabolic activity noted in the bones.   Interval history-patient reports tingling numbness in his bilateral feet.  It has been gradually getting worse over the last 2 weeks.  He has had intermittent dizziness for which she takes as needed meclizine.  Reports ongoing fatigue.  Denies other complaints  ECOG PS- 1 Pain scale- 0   Review of systems- Review of Systems  Constitutional: Positive for malaise/fatigue. Negative for chills, fever and weight loss.  HENT: Negative for congestion, ear discharge and nosebleeds.   Eyes: Negative for blurred vision.  Respiratory: Negative for cough, hemoptysis, sputum production, shortness of breath and wheezing.   Cardiovascular: Negative for chest pain, palpitations, orthopnea and claudication.  Gastrointestinal: Negative for abdominal pain, blood in stool, constipation, diarrhea, heartburn, melena, nausea and vomiting.  Genitourinary: Negative for dysuria, flank pain, frequency, hematuria and urgency.  Musculoskeletal: Negative for back pain, joint pain and myalgias.  Skin: Negative for rash.  Neurological: Positive for sensory change (Peripheral neuropathy). Negative for dizziness, tingling, focal weakness, seizures, weakness and headaches.  Endo/Heme/Allergies: Does not bruise/bleed easily.  Psychiatric/Behavioral: Negative for depression and suicidal ideas. The patient does not have insomnia.       Allergies  Allergen Reactions  . Losartan Potassium-Hctz Other (See Comments)     Unknown reaction.  . Morphine And Related Itching    WITH PROLONGED USE     Past Medical History:  Diagnosis Date  . Anxiety   . Collagen vascular disease ([place])   . Depression   . Diabetes mellitus without complication ([place])   . DVT (deep venous thrombosis) ([place])   . History of kidney stones   . Hypertension   . Lymphoma ([place]) 07/2020  . Sleep apnea      Past Surgical History:  Procedure Laterality Date  . CERVICAL FUSION    . CYSTOSCOPY W/ RETROGRADES Left 04/26/2020   Procedure: CYSTOSCOPY WITH RETROGRADE PYELOGRAM;  Surgeon: Hollice Espy, MD;  Location: [facility];  Service: Urology;  Laterality: Left;  . CYSTOSCOPY WITH URETEROSCOPY AND STENT PLACEMENT Right 07/02/2020   Procedure: CYSTOSCOPY WITH URETEROSCOPY AND STENT PLACEMENT;  Surgeon: Hollice Espy, MD;  Location: [facility];  Service: Urology;  Laterality: Right;  . CYSTOSCOPY/URETEROSCOPY/HOLMIUM LASER/STENT PLACEMENT Right 04/26/2020   Procedure: CYSTOSCOPY/URETEROSCOPY/HOLMIUM LASER/STENT PLACEMENT;  Surgeon: Hollice Espy, MD;  Location: [facility];  Service: Urology;  Laterality: Right;  . IR IMAGING GUIDED PORT INSERTION  07/27/2020  . PLANTAR FASCIA SURGERY Bilateral   . ROTATOR CUFF REPAIR Left   . THUMB FUSION    . URETERAL BIOPSY Right 04/26/2020   Procedure: URETERAL BIOPSY with ablation;  Surgeon: Hollice Espy, MD;  Location: [facility];  Service: Urology;  Laterality: Right;  . URETEROSCOPY WITH HOLMIUM LASER LITHOTRIPSY      Social History   Socioeconomic History  . Marital status: Single  Spouse name: Not on file  . Number of children: 0  . Years of education: Not on file  . 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 Smoking status: Former Smoker    Types: Cigarettes    Quit date: 1981    Years since quitting: 41.2  . Smokeless tobacco: Never Used  Vaping Use  . Vaping Use: Never used  Substance  and Sexual Activity  . Alcohol use: Never  . Drug use: Never  . Sexual activity: Not Currently  Other Topics Concern  . Not on file  Social History Narrative   Lives by himself..   Social Determinants of Health   Financial Resource Strain: Not on file  Food Insecurity: Not on file  Transportation Needs: Not on file  Physical Activity: Not on file  Stress: Not on file  Social Connections: Not on file  Intimate Partner Violence: Not on file    Family History  Problem Relation Age of Onset  . Pancreatic cancer Father   . Prostate cancer Paternal Grandmother   . Leukemia Nephew      Current Outpatient Medications:  .  allopurinol (ZYLOPRIM) 300 MG tablet, Take 1 tablet (300 mg total) by mouth daily., Disp: 30 tablet, Rfl: 3 .  ALPRAZolam (XANAX) 0.5 MG tablet, Take 0.5 mg by mouth in the morning and at bedtime. , Disp: , Rfl:  .  buPROPion (WELLBUTRIN XL) 150 MG 24 hr tablet, Take 300 mg by mouth daily. , Disp: , Rfl:  .  Cyanocobalamin (VITAMIN B-12 PO), Take 1 capsule by mouth daily. Pure Encapsulations B12 Folate Capsules, Disp: , Rfl:  .  diclofenac Sodium (VOLTAREN) 1 % GEL, Apply 1 g topically 4 (four) times daily as needed (thumb as needed for pain.). , Disp: , Rfl:  .  docusate sodium (COLACE) 100 MG capsule, Take 200 mg by mouth daily., Disp: , Rfl:  .  ergocalciferol (VITAMIN D2) 1.25 MG (50000 UT) capsule, Take 50,000 Units by mouth every Monday. , Disp: , Rfl:  .  escitalopram (LEXAPRO) 20 MG tablet, Take 20 mg by mouth daily. , Disp: , Rfl:  .  etodolac (LODINE) 400 MG tablet, Take 400 mg by mouth daily with breakfast., Disp: , Rfl:  .  glipiZIDE (GLUCOTROL XL) 10 MG 24 hr tablet, Take 10 mg by mouth daily with breakfast., Disp: , Rfl:  .  lidocaine-prilocaine (EMLA) cream, Apply to affected area once, Disp: 30 g, Rfl: 3 .  loratadine (CLARITIN) 10 MG tablet, Take 10 mg by mouth daily. , Disp: , Rfl:  .  Magnesium 500 MG TABS, Take 1,000 mg by mouth daily., Disp: ,  Rfl:  .  meclizine (ANTIVERT) 25 MG tablet, Take 25 mg by mouth 2 (two) times daily as needed for dizziness., Disp: , Rfl:  .  oxycodone (OXY-IR) 5 MG capsule, Take 1 capsule (5 mg total) by mouth every 6 (six) hours as needed., Disp: 60 capsule, Rfl: 0 .  oxyCODONE-acetaminophen (PERCOCET) 5-325 MG tablet, Take 1-2 tablets by mouth every 4 (four) hours as needed for moderate pain or severe pain., Disp: 20 tablet, Rfl: 0 .  telmisartan (MICARDIS) 80 MG tablet, Take 40 mg by mouth every evening. , Disp: , Rfl:  .  warfarin (COUMADIN) 5 MG tablet, Take 5 mg by mouth every evening. Take 1.5 tablets (7.5 mg) by mouth on Saturdays in the evening & take 1 tablet (  5 mg) by mouth on Sundays, Mondays, Tuesdays, Wednesdays, Thursdays & Fridays., Disp: , Rfl:  .  ondansetron (ZOFRAN) 8 MG tablet, Take 1 tablet (8 mg total) by mouth 2 (two) times daily as needed for refractory nausea / vomiting. Start on day 3 after cyclophosphamide chemotherapy. (Patient not taking: No sig reported), Disp: 30 tablet, Rfl: 1 .  predniSONE (DELTASONE) 20 MG tablet, Take 5 tablets (100 mg total) by mouth daily. Take with food on days 1-5 of chemotherapy. (Patient not taking: Reported on 10/29/2020), Disp: 25 tablet, Rfl: 0 .  prochlorperazine (COMPAZINE) 10 MG tablet, Take 1 tablet (10 mg total) by mouth every 6 (six) hours as needed (Nausea or vomiting). (Patient not taking: No sig reported), Disp: 30 tablet, Rfl: 6  Physical exam:  Vitals:   10/29/20 1054  BP: 125/74  Pulse: 77  Resp: 20  Temp: (!) 96.2 F (35.7 C)  TempSrc: Tympanic  SpO2: 97%  Weight: 225 lb 9.6 oz (102.3 kg)   Physical Exam Constitutional:      General: He is not in acute distress. Cardiovascular:     Rate and Rhythm: Normal rate.  Pulmonary:     Effort: Pulmonary effort is normal.  Skin:    General: Skin is warm and dry.  Neurological:     Mental Status: He is alert and oriented to person, place, and time.      CMP Latest Ref Rng & Units  10/29/2020  Glucose 70 - 99 mg/dL 232(H)  BUN 8 - 23 mg/dL 18  Creatinine 0.61 - 1.24 mg/dL 1.46(H)  Sodium 135 - 145 mmol/L 137  Potassium 3.5 - 5.1 mmol/L 4.0  Chloride 98 - 111 mmol/L 104  CO2 22 - 32 mmol/L 21(L)  Calcium 8.9 - 10.3 mg/dL 8.7(L)  Total Protein 6.5 - 8.1 g/dL 5.9(L)  Total Bilirubin 0.3 - 1.2 mg/dL 0.6  Alkaline Phos 38 - 126 U/L 39  AST 15 - 41 U/L 34  ALT 0 - 44 U/L 31   CBC Latest Ref Rng & Units 10/29/2020  WBC 4.0 - 10.5 K/uL 4.8  Hemoglobin 13.0 - 17.0 g/dL 12.6(L)  Hematocrit 39.0 - 52.0 % 38.0(L)  Platelets 150 - 400 K/uL 209    No images are attached to the encounter.  NM PET Image Restag (PS) Skull Base To Thigh  Result Date: 10/19/2020 CLINICAL DATA:  Subsequent treatment strategy for non-Hodgkin's lymphoma. EXAM: NUCLEAR MEDICINE PET SKULL BASE TO THIGH TECHNIQUE: 12.1 mCi F-18 FDG was injected intravenously. Full-ring PET imaging was performed from the skull base to thigh after the radiotracer. CT data was obtained and used for attenuation correction and anatomic localization. Fasting blood glucose: 132 mg/dl COMPARISON:  08/02/2020 FINDINGS: Mediastinal blood pool activity: SUV max 2.4 Liver activity: SUV max 3.5 NECK: No hypermetabolic lymph nodes in the neck. Incidental CT findings: Port in the anterior chest wall with tip in distal SVC. CHEST: No hypermetabolic mediastinal or hilar nodes. No suspicious pulmonary nodules on the CT scan. Incidental CT findings: Port in the anterior chest wall with tip in distal SVC. ABDOMEN/PELVIS: Soft tissue mass along the proximal RIGHT ureter is decreased significantly in size measuring 7 mm (image 201) decreased from 33 mm. Lesion has no measurable metabolic activity. No new hypermetabolic abdominopelvic lymph nodes. Spleen is normal size and metabolic activity. Incidental CT findings: None SKELETON: No focal hypermetabolic activity to suggest skeletal metastasis. Incidental CT findings: none IMPRESSION: 1. Soft tissue  mass along the proximal RIGHT ureter is decreased significantly  in size and has no measurable metabolic activity (Deauville 1). 2. No evidence of new lymphoma sites on skull base to thigh FDG PET scan. Electronically Signed   By: Suzy Bouchard M.D.   On: 10/19/2020 11:52     Assessment and plan- Patient is a 65 y.o. male with stage IE high-grade B-cell lymphoma.  He is s/p 3 cycles of R-CHOP chemotherapy and here to discuss PET CT scan results  I have reviewed PET/CT scan images independently and discussed findings with the patient.  Patient had a 3.3 cm Periureteral mass which is now down to 7 mm with no appreciable hypermetabolic activity and a Deauville score of 1.  He has therefore achieved remission with 3 cycles of R-CHOP chemotherapy.  At this time I will refer him to radiation oncology for involved field radiation therapy and hold off on giving further chemotherapy especially given his worsening neuropathy as well.  I will consider getting a repeat CT scan in 6 months to ensure stability of the 7 mm lesion which is likely residual nonactive tumor mass.  CBC with differential CMP and LDH in 3 months and I will see him for an in person visit  Chemo-induced peripheral neuropathy: We discussed various options including gabapentin versus Cymbalta versus watchful monitoring.  Patient would like to see how his symptoms go over the next 2 to 3 weeks and he will let us know if they are getting worse and he would like to get started on any medication.   Visit Diagnosis 1. Diffuse large B-cell lymphoma of extranodal site excluding spleen and other solid organs ([place])   2. Goals of care, counseling/discussion      Dr. Randa Evens, MD, MPH [facility] at [facility] [phone] 10/29/2020 2:11 PM

## 2020-10-31 ENCOUNTER — Telehealth: Payer: Self-pay | Admitting: Licensed Clinical Social Worker

## 2020-10-31 NOTE — Telephone Encounter (Signed)
Patients caretaker called and to cancel appointment until after the patient recovers from multiple procedures he is about to have. Caretaker states that she would call back and reschedule for sometime next month. I offered to reschedule for next month and she said she would just call back so she could coordinate with the other appointments

## 2020-11-05 ENCOUNTER — Ambulatory Visit: Payer: Commercial Managed Care - PPO | Admitting: Radiation Oncology

## 2020-11-05 ENCOUNTER — Encounter: Payer: Self-pay | Admitting: Radiation Oncology

## 2020-11-05 ENCOUNTER — Ambulatory Visit
Admission: RE | Admit: 2020-11-05 | Discharge: 2020-11-05 | Disposition: A | Discharge status: Home / Self Care | Payer: Commercial Managed Care - PPO | Source: Ambulatory Visit | Attending: Radiation Oncology | Admitting: Radiation Oncology

## 2020-11-05 ENCOUNTER — Other Ambulatory Visit: Payer: Self-pay

## 2020-11-05 VITALS — BP 126/89 | HR 101 | Temp 97.2°F | Wt 228.0 lb

## 2020-11-05 DIAGNOSIS — C8339 Diffuse large B-cell lymphoma, extranodal and solid organ sites: Secondary | ICD-10-CM

## 2020-11-05 NOTE — Consult Note (Signed)
NEW PATIENT EVALUATION  Name: Eddie Ryan  MRN: [id number]  Date:   11/05/2020     DOB: 1956-04-21   This 65 y.o. 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  REFERRING PHYSICIAN: Rusty Aus, MD  CHIEF COMPLAINT:  Chief Complaint  Patient presents with  . Consult    DIAGNOSIS: The encounter diagnosis was Diffuse large B-cell lymphoma of extranodal site excluding spleen and other solid organs ([place]).   PREVIOUS INVESTIGATIONS:  PET/CT and CT scans reviewed Pathology report reviewed Clinical notes reviewed  HPI: Patient is a 65 year old male who originally presented with right kidney stone.  He was seen by urology although there was a mass in the proximal right ureter shown on CT scan concerning for urothelial lesion.  Ureteroscopy in September 21 did not show any intraluminal pathology.  He again in November 2021 showed enhancing soft tissue lesion in the proximal right ureter measuring approximately 3.5 x 2.3 x 2.3 cm with no evidence of retroperitoneal adenopathy.  He underwent a stent placement and underwent a CT-guided biopsy of the mass with extrinsic compression on the right ureter.  Biopsy was positive for high-grade B-cell lymphoma with AKI-67 of greater than 90%.  Bone marrow was negative.  PET CT scan initially showed 3.3 cm soft tissue lesion around the right proximal ureter with an SUV of 13.4.  He underwent 3 cycles of R-CHOP with repeat PET CT scan showing a 7 mm residual periureteral mass with no appreciable hypermetabolic activity in Deauville score of 1.  Patient did develop some peripheral neuropathy.  Based on peripheral neuropathy excellent response he is now referred to radiation collagen for consideration of involved field treatment.  His major concern is continued fatigue and some shortness of breath.   He specifically Nu[place] fever chills night sweats any pain or any hematuria.  PLANNED TREATMENT REGIMEN: Involved field radiation therapy  PAST MEDICAL HISTORY:  has a past medical history of Anxiety, Collagen vascular disease ([place]), Depression, Diabetes mellitus without complication ([place]), DVT (deep venous thrombosis) ([place]), History of kidney stones, Hypertension, Lymphoma ([place]) (07/2020), and Sleep apnea.    PAST SURGICAL HISTORY:  Past Surgical History:  Procedure Laterality Date  . CERVICAL FUSION    . CYSTOSCOPY W/ RETROGRADES Left 04/26/2020   Procedure: CYSTOSCOPY WITH RETROGRADE PYELOGRAM;  Surgeon: Hollice Espy, MD;  Location: [facility];  Service: Urology;  Laterality: Left;  . CYSTOSCOPY WITH URETEROSCOPY AND STENT PLACEMENT Right 07/02/2020   Procedure: CYSTOSCOPY WITH URETEROSCOPY AND STENT PLACEMENT;  Surgeon: Hollice Espy, MD;  Location: [facility];  Service: Urology;  Laterality: Right;  . CYSTOSCOPY/URETEROSCOPY/HOLMIUM LASER/STENT PLACEMENT Right 04/26/2020   Procedure: CYSTOSCOPY/URETEROSCOPY/HOLMIUM LASER/STENT PLACEMENT;  Surgeon: Hollice Espy, MD;  Location: [facility];  Service: Urology;  Laterality: Right;  . IR IMAGING GUIDED PORT INSERTION  07/27/2020  . PLANTAR FASCIA SURGERY Bilateral   . ROTATOR CUFF REPAIR Left   . THUMB FUSION    . URETERAL BIOPSY Right 04/26/2020   Procedure: URETERAL BIOPSY with ablation;  Surgeon: Hollice Espy, MD;  Location: [facility];  Service: Urology;  Laterality: Right;  . URETEROSCOPY WITH HOLMIUM LASER LITHOTRIPSY      FAMILY HISTORY: family history includes Leukemia in his nephew; Pancreatic cancer in his father; Prostate cancer in his paternal grandmother.  SOCIAL HISTORY:  reports that he quit smoking about 41 years ago. His  smoking use included cigarettes. He has never used smokeless tobacco. He reports that he does not drink alcohol and does not use drugs.  ALLERGIES: Losartan potassium-hctz and Morphine and  related  MEDICATIONS:  Current Outpatient Medications  Medication Sig Dispense Refill  . allopurinol (ZYLOPRIM) 300 MG tablet Take 1 tablet (300 mg total) by mouth daily. 30 tablet 3  . ALPRAZolam (XANAX) 0.5 MG tablet Take 0.5 mg by mouth in the morning and at bedtime.     Marland Kitchen buPROPion (WELLBUTRIN XL) 150 MG 24 hr tablet Take 300 mg by mouth daily.     . Cyanocobalamin (VITAMIN B-12 PO) Take 1 capsule by mouth daily. Pure Encapsulations B12 Folate Capsules    . diclofenac Sodium (VOLTAREN) 1 % GEL Apply 1 g topically 4 (four) times daily as needed (thumb as needed for pain.).     Marland Kitchen docusate sodium (COLACE) 100 MG capsule Take 200 mg by mouth daily.    . ergocalciferol (VITAMIN D2) 1.25 MG (50000 UT) capsule Take 50,000 Units by mouth every Monday.     . escitalopram (LEXAPRO) 20 MG tablet Take 20 mg by mouth daily.     Marland Kitchen etodolac (LODINE) 400 MG tablet Take 400 mg by mouth daily with breakfast.    . glipiZIDE (GLUCOTROL XL) 10 MG 24 hr tablet Take 10 mg by mouth daily with breakfast.    . lidocaine-prilocaine (EMLA) cream Apply to affected area once 30 g 3  . loratadine (CLARITIN) 10 MG tablet Take 10 mg by mouth daily.     . Magnesium 500 MG TABS Take 1,000 mg by mouth daily.    . meclizine (ANTIVERT) 25 MG tablet Take 25 mg by mouth 2 (two) times daily as needed for dizziness.    Marland Kitchen oxycodone (OXY-IR) 5 MG capsule Take 1 capsule (5 mg total) by mouth every 6 (six) hours as needed. 60 capsule 0  . oxyCODONE-acetaminophen (PERCOCET) 5-325 MG tablet Take 1-2 tablets by mouth every 4 (four) hours as needed for moderate pain or severe pain. 20 tablet 0  . telmisartan (MICARDIS) 80 MG tablet Take 40 mg by mouth every evening.     . warfarin (COUMADIN) 5 MG tablet Take 5 mg by mouth every evening. Take 1.5 tablets (7.5 mg) by mouth on Saturdays in the evening & take 1 tablet (5 mg) by mouth on Sundays, Mondays, Tuesdays, Wednesdays, Thursdays & Fridays.    . ondansetron (ZOFRAN) 8 MG tablet Take 1  tablet (8 mg total) by mouth 2 (two) times daily as needed for refractory nausea / vomiting. Start on day 3 after cyclophosphamide chemotherapy. (Patient not taking: No sig reported) 30 tablet 1  . predniSONE (DELTASONE) 20 MG tablet Take 5 tablets (100 mg total) by mouth daily. Take with food on days 1-5 of chemotherapy. (Patient not taking: No sig reported) 25 tablet 0  . prochlorperazine (COMPAZINE) 10 MG tablet Take 1 tablet (10 mg total) by mouth every 6 (six) hours as needed (Nausea or vomiting). (Patient not taking: No sig reported) 30 tablet 6   No current facility-administered medications for this encounter.    ECOG PERFORMANCE STATUS:  0 - Asymptomatic  REVIEW OF SYSTEMS: Except for the fatigue Patient denies any weight loss, fatigue, weakness, fever, chills or night sweats. Patient denies any loss of vision, blurred vision. Patient denies any ringing  of the ears or hearing loss. No irregular heartbeat. Patient denies heart murmur or history of fainting. Patient denies any chest pain or pain radiating to  her upper extremities. Patient denies any shortness of breath, difficulty breathing at night, cough or hemoptysis. Patient denies any swelling in the lower legs. Patient denies any nausea vomiting, vomiting of blood, or coffee ground material in the vomitus. Patient denies any stomach pain. Patient states has had normal bowel movements no significant constipation or diarrhea. Patient denies any dysuria, hematuria or significant nocturia. Patient denies any problems walking, swelling in the joints or loss of balance. Patient denies any skin changes, loss of hair or loss of weight. Patient denies any excessive worrying or anxiety or significant depression. Patient denies any problems with insomnia. Patient denies excessive thirst, polyuria, polydipsia. Patient denies any swollen glands, patient denies easy bruising or easy bleeding. Patient denies any recent infections, allergies or URI. Patient  "s visual fields have not changed significantly in recent time.  PHYSICAL EXAM: BP 126/89   Pulse (!) 101   Temp (!) 97.2 F (36.2 C) (Tympanic)   Wt 228 lb (103.4 kg)   BMI 29.27 kg/m  Well-developed well-nourished patient in NAD. HEENT reveals PERLA, EOMI, discs not visualized.  Oral cavity is clear. No oral mucosal lesions are identified. Neck is clear without evidence of cervical or supraclavicular adenopathy. Lungs are clear to A&P. Cardiac examination is essentially unremarkable with regular rate and rhythm without murmur rub or thrill. Abdomen is benign with no organomegaly or masses noted. Motor sensory and DTR levels are equal and symmetric in the upper and lower extremities. Cranial nerves II through XII are grossly intact. Proprioception is intact. No peripheral adenopathy or edema is identified. No motor or sensory levels are noted. Crude visual fields are within normal range.  LABORATORY DATA: Pathology report reviewed    RADIOLOGY RESULTS: CT scans and PET CT scans reviewed compatible with above-stated findings   IMPRESSION: Stage Ia high-grade B-cell lymphoma of right proximal ureter in 65 year old male  PLAN: At this time oh recommended involved field radiation therapy would plan on delivering 30 Gray in 15 fractions.  I would use his initial PET CT scan as a fusion study forced treatment planning.  Risks and benefits of treatment including possible diarrhea fatigue alteration of blood counts all were reviewed with the patient.  Based on the small area of involved field radiation do not think will be significant side effects.  I have personally set up and ordered CT simulation for later this week.  Patient comprehends my recommendations well.  I would like to take this opportunity to thank you for allowing me to participate in the care of your patient.Noreene Filbert, MD

## 2020-11-08 ENCOUNTER — Ambulatory Visit: Payer: Commercial Managed Care - PPO

## 2020-11-13 ENCOUNTER — Ambulatory Visit
Admission: RE | Admit: 2020-11-13 | Discharge: 2020-11-13 | Disposition: A | Discharge status: Home / Self Care | Payer: Commercial Managed Care - PPO | Source: Ambulatory Visit | Attending: Radiation Oncology | Admitting: Radiation Oncology

## 2020-11-13 DIAGNOSIS — C8339 Diffuse large B-cell lymphoma, extranodal and solid organ sites: Secondary | ICD-10-CM | POA: Diagnosis present

## 2020-11-13 DIAGNOSIS — Z510 Encounter for antineoplastic radiation therapy: Secondary | ICD-10-CM | POA: Insufficient documentation

## 2020-11-15 ENCOUNTER — Other Ambulatory Visit: Payer: Self-pay | Admitting: *Deleted

## 2020-11-15 DIAGNOSIS — C8339 Diffuse large B-cell lymphoma, extranodal and solid organ sites: Secondary | ICD-10-CM

## 2020-11-19 ENCOUNTER — Other Ambulatory Visit: Payer: Self-pay | Admitting: Oncology

## 2020-11-19 DIAGNOSIS — Z510 Encounter for antineoplastic radiation therapy: Secondary | ICD-10-CM | POA: Diagnosis not present

## 2020-11-19 DIAGNOSIS — C8339 Diffuse large B-cell lymphoma, extranodal and solid organ sites: Secondary | ICD-10-CM

## 2020-11-20 ENCOUNTER — Ambulatory Visit: Admission: RE | Admit: 2020-11-20 | Payer: Commercial Managed Care - PPO | Source: Ambulatory Visit

## 2020-11-20 DIAGNOSIS — Z510 Encounter for antineoplastic radiation therapy: Secondary | ICD-10-CM | POA: Diagnosis not present

## 2020-11-21 ENCOUNTER — Ambulatory Visit
Admission: RE | Admit: 2020-11-21 | Discharge: 2020-11-21 | Disposition: A | Discharge status: Home / Self Care | Payer: Commercial Managed Care - PPO | Source: Ambulatory Visit | Attending: Radiation Oncology | Admitting: Radiation Oncology

## 2020-11-21 DIAGNOSIS — Z510 Encounter for antineoplastic radiation therapy: Secondary | ICD-10-CM | POA: Diagnosis not present

## 2020-11-22 ENCOUNTER — Ambulatory Visit
Admission: RE | Admit: 2020-11-22 | Discharge: 2020-11-22 | Disposition: A | Discharge status: Home / Self Care | Payer: Commercial Managed Care - PPO | Source: Ambulatory Visit | Attending: Radiation Oncology | Admitting: Radiation Oncology

## 2020-11-22 DIAGNOSIS — Z510 Encounter for antineoplastic radiation therapy: Secondary | ICD-10-CM | POA: Diagnosis not present

## 2020-11-23 ENCOUNTER — Ambulatory Visit
Admission: RE | Admit: 2020-11-23 | Discharge: 2020-11-23 | Disposition: A | Discharge status: Home / Self Care | Payer: Commercial Managed Care - PPO | Source: Ambulatory Visit | Attending: Radiation Oncology | Admitting: Radiation Oncology

## 2020-11-23 DIAGNOSIS — Z510 Encounter for antineoplastic radiation therapy: Secondary | ICD-10-CM | POA: Diagnosis not present

## 2020-11-26 ENCOUNTER — Ambulatory Visit
Admission: RE | Admit: 2020-11-26 | Discharge: 2020-11-26 | Disposition: A | Discharge status: Home / Self Care | Payer: Commercial Managed Care - PPO | Source: Ambulatory Visit | Attending: Radiation Oncology | Admitting: Radiation Oncology

## 2020-11-26 DIAGNOSIS — Z510 Encounter for antineoplastic radiation therapy: Secondary | ICD-10-CM | POA: Diagnosis not present

## 2020-11-27 ENCOUNTER — Ambulatory Visit
Admission: RE | Admit: 2020-11-27 | Discharge: 2020-11-27 | Disposition: A | Discharge status: Home / Self Care | Payer: Commercial Managed Care - PPO | Source: Ambulatory Visit | Attending: Radiation Oncology | Admitting: Radiation Oncology

## 2020-11-27 DIAGNOSIS — Z510 Encounter for antineoplastic radiation therapy: Secondary | ICD-10-CM | POA: Diagnosis not present

## 2020-11-28 ENCOUNTER — Ambulatory Visit
Admission: RE | Admit: 2020-11-28 | Discharge: 2020-11-28 | Disposition: A | Discharge status: Home / Self Care | Payer: Commercial Managed Care - PPO | Source: Ambulatory Visit | Attending: Radiation Oncology | Admitting: Radiation Oncology

## 2020-11-28 DIAGNOSIS — Z510 Encounter for antineoplastic radiation therapy: Secondary | ICD-10-CM | POA: Diagnosis not present

## 2020-11-29 ENCOUNTER — Inpatient Hospital Stay: Payer: Commercial Managed Care - PPO | Attending: Oncology

## 2020-11-29 ENCOUNTER — Ambulatory Visit
Admission: RE | Admit: 2020-11-29 | Discharge: 2020-11-29 | Disposition: A | Discharge status: Home / Self Care | Payer: Commercial Managed Care - PPO | Source: Ambulatory Visit | Attending: Radiation Oncology | Admitting: Radiation Oncology

## 2020-11-29 DIAGNOSIS — C8339 Diffuse large B-cell lymphoma, extranodal and solid organ sites: Secondary | ICD-10-CM | POA: Diagnosis not present

## 2020-11-29 DIAGNOSIS — Z510 Encounter for antineoplastic radiation therapy: Secondary | ICD-10-CM | POA: Diagnosis not present

## 2020-11-29 LAB — CBC
HCT: 44.4 % (ref 39.0–52.0)
Hemoglobin: 14.7 g/dL (ref 13.0–17.0)
MCH: 29.5 pg (ref 26.0–34.0)
MCHC: 33.1 g/dL (ref 30.0–36.0)
MCV: 89.2 fL (ref 80.0–100.0)
Platelets: 198 10*3/uL (ref 150–400)
RBC: 4.98 MIL/uL (ref 4.22–5.81)
RDW: 14.1 % (ref 11.5–15.5)
WBC: 5.1 10*3/uL (ref 4.0–10.5)
nRBC: 0 % (ref 0.0–0.2)

## 2020-11-30 ENCOUNTER — Ambulatory Visit
Admission: RE | Admit: 2020-11-30 | Discharge: 2020-11-30 | Disposition: A | Discharge status: Home / Self Care | Payer: Commercial Managed Care - PPO | Source: Ambulatory Visit | Attending: Radiation Oncology | Admitting: Radiation Oncology

## 2020-11-30 DIAGNOSIS — Z510 Encounter for antineoplastic radiation therapy: Secondary | ICD-10-CM | POA: Diagnosis not present

## 2020-12-03 ENCOUNTER — Ambulatory Visit
Admission: RE | Admit: 2020-12-03 | Discharge: 2020-12-03 | Disposition: A | Discharge status: Home / Self Care | Payer: Commercial Managed Care - PPO | Source: Ambulatory Visit | Attending: Radiation Oncology | Admitting: Radiation Oncology

## 2020-12-03 DIAGNOSIS — Z510 Encounter for antineoplastic radiation therapy: Secondary | ICD-10-CM | POA: Diagnosis not present

## 2020-12-04 ENCOUNTER — Ambulatory Visit
Admission: RE | Admit: 2020-12-04 | Discharge: 2020-12-04 | Disposition: A | Discharge status: Home / Self Care | Payer: Commercial Managed Care - PPO | Source: Ambulatory Visit | Attending: Radiation Oncology | Admitting: Radiation Oncology

## 2020-12-04 DIAGNOSIS — Z510 Encounter for antineoplastic radiation therapy: Secondary | ICD-10-CM | POA: Diagnosis not present

## 2020-12-05 ENCOUNTER — Ambulatory Visit
Admission: RE | Admit: 2020-12-05 | Discharge: 2020-12-05 | Disposition: A | Discharge status: Home / Self Care | Payer: Commercial Managed Care - PPO | Source: Ambulatory Visit | Attending: Radiation Oncology | Admitting: Radiation Oncology

## 2020-12-05 DIAGNOSIS — Z510 Encounter for antineoplastic radiation therapy: Secondary | ICD-10-CM | POA: Diagnosis not present

## 2020-12-06 ENCOUNTER — Other Ambulatory Visit: Payer: Self-pay

## 2020-12-06 ENCOUNTER — Ambulatory Visit
Admission: RE | Admit: 2020-12-06 | Discharge: 2020-12-06 | Disposition: A | Discharge status: Home / Self Care | Payer: Commercial Managed Care - PPO | Source: Ambulatory Visit | Attending: Radiation Oncology | Admitting: Radiation Oncology

## 2020-12-06 ENCOUNTER — Inpatient Hospital Stay: Payer: Commercial Managed Care - PPO

## 2020-12-06 DIAGNOSIS — C8339 Diffuse large B-cell lymphoma, extranodal and solid organ sites: Secondary | ICD-10-CM

## 2020-12-06 DIAGNOSIS — Z510 Encounter for antineoplastic radiation therapy: Secondary | ICD-10-CM | POA: Diagnosis not present

## 2020-12-06 LAB — CBC
HCT: 44 % (ref 39.0–52.0)
Hemoglobin: 14.5 g/dL (ref 13.0–17.0)
MCH: 29.5 pg (ref 26.0–34.0)
MCHC: 33 g/dL (ref 30.0–36.0)
MCV: 89.6 fL (ref 80.0–100.0)
Platelets: 166 10*3/uL (ref 150–400)
RBC: 4.91 MIL/uL (ref 4.22–5.81)
RDW: 13.8 % (ref 11.5–15.5)
WBC: 6.1 10*3/uL (ref 4.0–10.5)
nRBC: 0 % (ref 0.0–0.2)

## 2020-12-07 ENCOUNTER — Ambulatory Visit
Admission: RE | Admit: 2020-12-07 | Discharge: 2020-12-07 | Disposition: A | Discharge status: Home / Self Care | Payer: Commercial Managed Care - PPO | Source: Ambulatory Visit | Attending: Radiation Oncology | Admitting: Radiation Oncology

## 2020-12-07 DIAGNOSIS — Z510 Encounter for antineoplastic radiation therapy: Secondary | ICD-10-CM | POA: Diagnosis not present

## 2020-12-10 ENCOUNTER — Ambulatory Visit
Admission: RE | Admit: 2020-12-10 | Discharge: 2020-12-10 | Disposition: A | Discharge status: Home / Self Care | Payer: Commercial Managed Care - PPO | Source: Ambulatory Visit | Attending: Radiation Oncology | Admitting: Radiation Oncology

## 2020-12-10 DIAGNOSIS — C8339 Diffuse large B-cell lymphoma, extranodal and solid organ sites: Secondary | ICD-10-CM | POA: Diagnosis present

## 2020-12-10 DIAGNOSIS — Z510 Encounter for antineoplastic radiation therapy: Secondary | ICD-10-CM | POA: Insufficient documentation

## 2020-12-11 ENCOUNTER — Ambulatory Visit
Admission: RE | Admit: 2020-12-11 | Discharge: 2020-12-11 | Disposition: A | Discharge status: Home / Self Care | Payer: Commercial Managed Care - PPO | Source: Ambulatory Visit | Attending: Radiation Oncology | Admitting: Radiation Oncology

## 2020-12-11 DIAGNOSIS — Z510 Encounter for antineoplastic radiation therapy: Secondary | ICD-10-CM | POA: Diagnosis not present

## 2020-12-20 ENCOUNTER — Other Ambulatory Visit: Payer: Self-pay | Admitting: Urology

## 2021-01-16 ENCOUNTER — Other Ambulatory Visit: Payer: Self-pay

## 2021-01-16 ENCOUNTER — Ambulatory Visit
Admission: RE | Admit: 2021-01-16 | Discharge: 2021-01-16 | Disposition: A | Discharge status: Home / Self Care | Payer: Commercial Managed Care - PPO | Source: Ambulatory Visit | Attending: Radiation Oncology | Admitting: Radiation Oncology

## 2021-01-16 ENCOUNTER — Encounter: Payer: Self-pay | Admitting: Radiation Oncology

## 2021-01-16 VITALS — BP 131/91 | HR 94 | Temp 96.9°F | Wt 221.0 lb

## 2021-01-16 DIAGNOSIS — R1013 Epigastric pain: Secondary | ICD-10-CM | POA: Diagnosis not present

## 2021-01-16 DIAGNOSIS — C8339 Diffuse large B-cell lymphoma, extranodal and solid organ sites: Secondary | ICD-10-CM | POA: Diagnosis present

## 2021-01-16 DIAGNOSIS — Z923 Personal history of irradiation: Secondary | ICD-10-CM | POA: Insufficient documentation

## 2021-01-16 DIAGNOSIS — R197 Diarrhea, unspecified: Secondary | ICD-10-CM | POA: Diagnosis not present

## 2021-01-16 NOTE — Progress Notes (Signed)
Radiation Oncology Follow up Note  Name: ARACELI COUFAL   Date:   01/16/2021 MRN:  [id number] DOB: 1956-06-14    This 65 y.o. male presents to the clinic today for 1 month follow-up status post involved field radiation therapy to his right utero region for stage Ia high-grade B-cell lymphoma status post 3 cycles of R-CHOP.  REFERRING PROVIDER: Rusty Aus, MD  HPI: Patient is a 65 year old male now at 1 month having completed involved field radiation therapy to right ureteral area for involved field high-grade B-cell lymphoma status post R-CHOP with excellent response seen today in routine follow-up he is having some minor issues he continues to have some intermittent diarrhea and some dyspepsia.  He also states he is having some heat intolerance specially working outside in the hot days.  He specifically [place] fever chills night sweats or any other new areas of adenopathy..  COMPLICATIONS OF TREATMENT: none  FOLLOW UP COMPLIANCE: keeps appointments   PHYSICAL EXAM:  BP (!) 131/91   Pulse 94   Temp (!) 96.9 F (36.1 C) (Tympanic)   Wt 221 lb (100.2 kg)   BMI 28.37 kg/m  No evidence of peripheral adenopathy is identified.  Abdomen is benign.  Well-developed well-nourished patient in NAD. HEENT reveals PERLA, EOMI, discs not visualized.  Oral cavity is clear. No oral mucosal lesions are identified. Neck is clear without evidence of cervical or supraclavicular adenopathy. Lungs are clear to A&P. Cardiac examination is essentially unremarkable with regular rate and rhythm without murmur rub or thrill. Abdomen is benign with no organomegaly or masses noted. Motor sensory and DTR levels are equal and symmetric in the upper and lower extremities. Cranial nerves II through XII are grossly intact. Proprioception is intact. No peripheral adenopathy or edema is identified. No motor or sensory levels are noted. Crude visual fields are within normal range.  RADIOLOGY RESULTS: No current films to  review  PLAN: Present time explained to him some of the side effects may be due to chemotherapy rather than radiation since his colon as well as his stomach were not in our treatment fields.  I have asked to see in 4 to 5 months for follow-up.  I have asked him to try to remember what type of foods are setting him off for his diarrhea and to avoid them.  He has follow-up appointments with medical oncology.  Patient is to call with any concerns.  I would like to take this opportunity to thank you for allowing me to participate in the care of your patient.Noreene Filbert, MD

## 2021-01-29 ENCOUNTER — Inpatient Hospital Stay: Payer: Commercial Managed Care - PPO | Admitting: Oncology

## 2021-01-29 ENCOUNTER — Inpatient Hospital Stay: Payer: Commercial Managed Care - PPO | Attending: Oncology

## 2021-01-29 ENCOUNTER — Encounter: Payer: Self-pay | Admitting: Oncology

## 2021-01-29 VITALS — BP 123/62 | HR 75 | Temp 97.6°F | Resp 16 | Ht 74.0 in | Wt 221.0 lb

## 2021-01-29 DIAGNOSIS — Z452 Encounter for adjustment and management of vascular access device: Secondary | ICD-10-CM | POA: Diagnosis not present

## 2021-01-29 DIAGNOSIS — C8339 Diffuse large B-cell lymphoma, extranodal and solid organ sites: Secondary | ICD-10-CM | POA: Insufficient documentation

## 2021-01-29 DIAGNOSIS — Z8042 Family history of malignant neoplasm of prostate: Secondary | ICD-10-CM | POA: Diagnosis not present

## 2021-01-29 DIAGNOSIS — T451X5A Adverse effect of antineoplastic and immunosuppressive drugs, initial encounter: Secondary | ICD-10-CM | POA: Diagnosis not present

## 2021-01-29 DIAGNOSIS — D6862 Lupus anticoagulant syndrome: Secondary | ICD-10-CM | POA: Insufficient documentation

## 2021-01-29 DIAGNOSIS — Z8579 Personal history of other malignant neoplasms of lymphoid, hematopoietic and related tissues: Secondary | ICD-10-CM

## 2021-01-29 DIAGNOSIS — Z86718 Personal history of other venous thrombosis and embolism: Secondary | ICD-10-CM | POA: Insufficient documentation

## 2021-01-29 DIAGNOSIS — Z08 Encounter for follow-up examination after completed treatment for malignant neoplasm: Secondary | ICD-10-CM | POA: Diagnosis not present

## 2021-01-29 DIAGNOSIS — F419 Anxiety disorder, unspecified: Secondary | ICD-10-CM | POA: Diagnosis not present

## 2021-01-29 DIAGNOSIS — F32A Depression, unspecified: Secondary | ICD-10-CM | POA: Diagnosis not present

## 2021-01-29 DIAGNOSIS — Z800 Family history of malignant neoplasm of digestive organs: Secondary | ICD-10-CM | POA: Diagnosis not present

## 2021-01-29 DIAGNOSIS — Z87442 Personal history of urinary calculi: Secondary | ICD-10-CM | POA: Insufficient documentation

## 2021-01-29 DIAGNOSIS — Z818 Family history of other mental and behavioral disorders: Secondary | ICD-10-CM | POA: Diagnosis not present

## 2021-01-29 DIAGNOSIS — Z885 Allergy status to narcotic agent status: Secondary | ICD-10-CM | POA: Diagnosis not present

## 2021-01-29 DIAGNOSIS — G620 Drug-induced polyneuropathy: Secondary | ICD-10-CM | POA: Insufficient documentation

## 2021-01-29 DIAGNOSIS — Z79899 Other long term (current) drug therapy: Secondary | ICD-10-CM | POA: Diagnosis not present

## 2021-01-29 DIAGNOSIS — R5383 Other fatigue: Secondary | ICD-10-CM | POA: Insufficient documentation

## 2021-01-29 DIAGNOSIS — Z7901 Long term (current) use of anticoagulants: Secondary | ICD-10-CM | POA: Diagnosis not present

## 2021-01-29 DIAGNOSIS — Z806 Family history of leukemia: Secondary | ICD-10-CM | POA: Insufficient documentation

## 2021-01-29 LAB — CBC WITH DIFFERENTIAL/PLATELET
Abs Immature Granulocytes: 0.01 10*3/uL (ref 0.00–0.07)
Basophils Absolute: 0 10*3/uL (ref 0.0–0.1)
Basophils Relative: 1 %
Eosinophils Absolute: 0.1 10*3/uL (ref 0.0–0.5)
Eosinophils Relative: 2 %
HCT: 44.2 % (ref 39.0–52.0)
Hemoglobin: 15.2 g/dL (ref 13.0–17.0)
Immature Granulocytes: 0 %
Lymphocytes Relative: 19 %
Lymphs Abs: 1.1 10*3/uL (ref 0.7–4.0)
MCH: 29.6 pg (ref 26.0–34.0)
MCHC: 34.4 g/dL (ref 30.0–36.0)
MCV: 86.2 fL (ref 80.0–100.0)
Monocytes Absolute: 0.7 10*3/uL (ref 0.1–1.0)
Monocytes Relative: 12 %
Neutro Abs: 3.7 10*3/uL (ref 1.7–7.7)
Neutrophils Relative %: 66 %
Platelets: 207 10*3/uL (ref 150–400)
RBC: 5.13 MIL/uL (ref 4.22–5.81)
RDW: 13.4 % (ref 11.5–15.5)
WBC: 5.6 10*3/uL (ref 4.0–10.5)
nRBC: 0 % (ref 0.0–0.2)

## 2021-01-29 LAB — LACTATE DEHYDROGENASE: LDH: 128 U/L (ref 98–192)

## 2021-01-29 LAB — COMPREHENSIVE METABOLIC PANEL
ALT: 27 U/L (ref 0–44)
AST: 24 U/L (ref 15–41)
Albumin: 3.8 g/dL (ref 3.5–5.0)
Alkaline Phosphatase: 41 U/L (ref 38–126)
Anion gap: 9 (ref 5–15)
BUN: 25 mg/dL — ABNORMAL HIGH (ref 8–23)
CO2: 27 mmol/L (ref 22–32)
Calcium: 9.1 mg/dL (ref 8.9–10.3)
Chloride: 99 mmol/L (ref 98–111)
Creatinine, Ser: 1.54 mg/dL — ABNORMAL HIGH (ref 0.61–1.24)
GFR, Estimated: 50 mL/min — ABNORMAL LOW (ref >60)
Glucose, Bld: 209 mg/dL — ABNORMAL HIGH (ref 70–99)
Potassium: 4.2 mmol/L (ref 3.5–5.1)
Sodium: 135 mmol/L (ref 135–145)
Total Bilirubin: 0.7 mg/dL (ref 0.3–1.2)
Total Protein: 6.5 g/dL (ref 6.5–8.1)

## 2021-01-29 MED ORDER — HEPARIN SOD (PORK) LOCK FLUSH 100 UNIT/ML IV SOLN
INTRAVENOUS | Status: AC
  Filled 2021-01-29: qty 5

## 2021-01-29 MED ORDER — SODIUM CHLORIDE 0.9% FLUSH
10.0000 mL | Freq: Once | INTRAVENOUS | Status: AC
  Administered 2021-01-29: 10 mL
  Filled 2021-01-29: qty 10

## 2021-01-29 MED ORDER — HEPARIN SOD (PORK) LOCK FLUSH 100 UNIT/ML IV SOLN
500.0000 [IU] | Freq: Once | INTRAVENOUS | Status: AC
Start: 2021-01-29 — End: 2021-01-29
  Administered 2021-01-29: 500 [IU]
  Filled 2021-01-29: qty 5

## 2021-01-29 NOTE — Progress Notes (Signed)
He is doing ok. 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 He feels like he is still trying to get back to himself. He is weak and has to take breaks with work

## 2021-01-29 NOTE — Progress Notes (Signed)
Hematology/Oncology Consult note [facility]  [phone] [phone]  Patient Care Team: Rusty Aus, MD as PCP - General (Internal Medicine) Sindy Guadeloupe, MD as Consulting Physician (Oncology)   Name of the patient: Eddie Ryan  [id number]  Jul 29, 1956   Date of visit: 01/29/21  Diagnosis- stage I E high-grade B-cell lymphoma currently in CR 1  Chief complaint/ Reason for visit-routine follow-up of DLBCL  Heme/Onc history:  Patient is a 65 year old male with a prior history of DVT about 15 years ago and was diagnosed with antiphospholipid antibody syndrome with a positive lupus anticoagulant.  He has remained on Coumadin since then.  He has been seeing Dr. Erlene Quan for kidney stones.  He had a CT abdomen and pelvis in August 2021 which showed thickening of the urothelium concerning for a urothelial lesion.  He underwent a cystoscopy and ureteroscopy in September 2021 which did not show any intraluminal pathology.  Plan was to get a repeat CT urogram 6 weeks following that.  He had a CT hematuria work-up again in November 2021 which showed enhancing soft tissue lesion in the proximal right ureter measuring 2.3 x 2.3 x 3.5 cm which was extending for 2.6 cm and enlarges compared to prior size of 1.5 x 1.1 x 2.9 cm.  There is no evidence of retroperitoneal adenopathy.  Left external iliac node was 1.2 cm and was unchanged as compared to prior CT scan.  He underwent a repeat ureteroscopy with stent placement.  There was no obvious filling defect but there was a slight narrowing of ureter and patient therefore underwent a CT-guided biopsy by IR   Biopsy showed B-cell lymphoma High-grade with a Ki-67 of greater than 90%. Comment:  Core biopsy sections display lymphoid tissue. There are aggregates  comprised of larger lymphocytes, interspersed with areas of small mature  lymphocytes, and focal background bands of fibrosis. Typical follicular   architecture is not present.   Immunohistochemical studies show strong and diffuse positivity for CD20,  indicating a B cell proliferation, comprised of both smaller and larger  lymphocytes. CD3 marks scattered background T cells. The subset of  larger B cells shows positivity for CD10, BCL6, and MUM-1 (partial).  These cells are negative for BCL2, Cyclin-D1, and CD30. BCL2 appears to  show marking in the subset of smaller lymphocytes, as well as normal T  cells. Ki-67 is significantly elevated in the subset of larger B cells,  estimated at greater than 90% staining. C-myc shows dim staining in a  portion of the larger B cells, approximately 20-30%. Pancytokeratin and  CD56 are negative.   Flow cytometric studies detect an abnormal CD10+ B cell population in a  background of polytypic B cells. These CD10+ B cells represent 28% of B  cells and show very dim kappa light chain restriction, to negative light  chain expression. By light scatter, these B cells appear approximately  the same size as background T cells. There is no loss of, or aberrant  expression of, pan T-cell antigens to suggest a neoplastic T cell  process. Cell viability in this sample is sufficient for analysis, but  less than optimal.   Overall classification of this process is limited due to the small size  of core biopsy samples. The histologic and immunophenotypic findings are  compatible with involvement by a B cell lymphoma. CD10 expression, as  seen by immunohistochemistry and flow cytometry, may imply  germinal/follicular center origin of these B cells. Absence of  staining  for BCL2 could suggest against follicular lymphoma, although BCL2  expression may be absent in 25-50% of cases of grade 3 follicular  lymphoma. The differential diagnosis may also include diffuse large B  cell lymphoma, given the increased proportion of larger abnormal B  cells. Further subclassification of this process would be dependent  upon  excisional biopsy or review of overall lesional architecture. There is  sufficient tissue for ancillary FISH or NGS testing, if desired.   Bone marrow biopsy was negative for lymphoma.  PET/CT scan showed:A 3.3 x 2.7 cm soft tissue lesion around the right proximal ureter with an SUV of 13.4.  There was no other hypermetabolic abdominopelvic adenopathy noted.  No hypermetabolism noted in the liver or spleen or other organs.  No intrathoracic adenopathy.  No hypermetabolic activity noted in the bones.      Interval history-patient still feels fatigued and does not feel that his energy levels are back to his baseline.  He is still struggling with neuropathy in his bilateral feet which especially bothers him at night.  Presently he is not on any medications.  In the past he was on Cymbalta as well as gabapentin.  States that he could not tolerate gabapentin but he is willing to try Cymbalta again.  ECOG PS- 1 Pain scale- 3   Review of systems- Review of Systems  Constitutional:  Positive for malaise/fatigue. Negative for chills, fever and weight loss.  HENT:  Negative for congestion, ear discharge and nosebleeds.   Eyes:  Negative for blurred vision.  Respiratory:  Negative for cough, hemoptysis, sputum production, shortness of breath and wheezing.   Cardiovascular:  Negative for chest pain, palpitations, orthopnea and claudication.  Gastrointestinal:  Negative for abdominal pain, blood in stool, constipation, diarrhea, heartburn, melena, nausea and vomiting.  Genitourinary:  Negative for dysuria, flank pain, frequency, hematuria and urgency.  Musculoskeletal:  Negative for back pain, joint pain and myalgias.  Skin:  Negative for rash.  Neurological:  Positive for sensory change (Peripheral neuropathy). Negative for dizziness, tingling, focal weakness, seizures, weakness and headaches.  Endo/Heme/Allergies:  Does not bruise/bleed easily.  Psychiatric/Behavioral:  Negative for depression  and suicidal ideas. The patient does not have insomnia.     Allergies  Allergen Reactions   Losartan Potassium-Hctz Other (See Comments)    Unknown reaction.   Morphine And Related Itching    WITH PROLONGED USE     Past Medical History:  Diagnosis Date   Anxiety    Collagen vascular disease ([place])    Depression    Diabetes mellitus without complication ([place])    DVT (deep venous thrombosis) ([place])    History of kidney stones    Hypertension    Lymphoma ([place]) 07/2020   Sleep apnea      Past Surgical History:  Procedure Laterality Date   CERVICAL FUSION     CYSTOSCOPY W/ RETROGRADES Left 04/26/2020   Procedure: CYSTOSCOPY WITH RETROGRADE PYELOGRAM;  Surgeon: Hollice Espy, MD;  Location: [facility];  Service: Urology;  Laterality: Left;   CYSTOSCOPY WITH URETEROSCOPY AND STENT PLACEMENT Right 07/02/2020   Procedure: CYSTOSCOPY WITH URETEROSCOPY AND STENT PLACEMENT;  Surgeon: Hollice Espy, MD;  Location: [facility];  Service: Urology;  Laterality: Right;   CYSTOSCOPY/URETEROSCOPY/HOLMIUM LASER/STENT PLACEMENT Right 04/26/2020   Procedure: CYSTOSCOPY/URETEROSCOPY/HOLMIUM LASER/STENT PLACEMENT;  Surgeon: Hollice Espy, MD;  Location: [facility];  Service: Urology;  Laterality: Right;   IR IMAGING GUIDED PORT INSERTION  07/27/2020   PLANTAR FASCIA SURGERY Bilateral    ROTATOR  CUFF REPAIR Left    THUMB FUSION     URETERAL BIOPSY Right 04/26/2020   Procedure: URETERAL BIOPSY with ablation;  Surgeon: Hollice Espy, MD;  Location: [facility];  Service: Urology;  Laterality: Right;   URETEROSCOPY WITH HOLMIUM LASER LITHOTRIPSY      Social History   Socioeconomic History   Marital status: Single    Spouse name: Not on file   Number of children: 0   Years of education: Not on file   Highest education level: Not on file  Occupational History    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   Social Determinants of Health   Financial Resource Strain: Not on file  Food Insecurity: Not on file  Transportation Needs: Not on file  Physical Activity: Not on file  Stress: Not on file  Social Connections: Not on file  Intimate Partner Violence: Not on file    Family History  Problem Relation Age of Onset   Dementia Mother    Pancreatic cancer Father    Prostate cancer Paternal Grandmother    Leukemia Nephew      Current Outpatient Medications:    ALPRAZolam (XANAX) 0.5 MG tablet, Take 0.5 mg by mouth in the morning and at bedtime. , Disp: , Rfl:    buPROPion (WELLBUTRIN XL) 150 MG 24 hr tablet, Take 300 mg by mouth daily. , Disp: , Rfl:    Cyanocobalamin (VITAMIN B-12 PO), Take 1 capsule by mouth daily. Pure Encapsulations B12 Folate Capsules, Disp: , Rfl:    diclofenac Sodium (VOLTAREN) 1 % GEL, Apply 1 g topically 4 (four) times daily as needed (thumb as needed for pain.). , Disp: , Rfl:    docusate sodium (COLACE) 100 MG capsule, Take 200 mg by mouth daily., Disp: , Rfl:    ergocalciferol (VITAMIN D2) 1.25 MG (50000 UT) capsule, Take 50,000 Units by mouth every Monday. , Disp: , Rfl:    escitalopram (LEXAPRO) 20 MG tablet, Take 20 mg by mouth daily. , Disp: , Rfl:    etodolac (LODINE) 400 MG tablet, Take 400 mg by mouth daily with breakfast., Disp: , Rfl:    glipiZIDE (GLUCOTROL XL) 10 MG 24 hr tablet, Take 10 mg by mouth daily with breakfast., Disp: , Rfl:    lidocaine-prilocaine (EMLA) cream, Apply to affected area once, Disp: 30 g, Rfl: 3   loratadine (CLARITIN) 10 MG tablet, Take 10 mg by mouth daily. , Disp: , Rfl:    Magnesium 500 MG TABS, Take 1,000 mg by mouth  daily., Disp: , Rfl:    oxycodone (OXY-IR) 5 MG capsule, Take 1 capsule (5 mg total) by mouth every 6 (six) hours as needed., Disp: 60 capsule, Rfl: 0   oxyCODONE-acetaminophen (PERCOCET) 5-325 MG tablet, Take 1-2 tablets by mouth every 4 (four) hours as needed for moderate pain or severe pain., Disp: 20 tablet, Rfl: 0   telmisartan (MICARDIS) 80 MG tablet, Take 40 mg by mouth every evening. , Disp: , Rfl:  warfarin (COUMADIN) 5 MG tablet, Take 5 mg by mouth every evening. Take 1.5 tablets (7.5 mg) by mouth on Saturdays in the evening & take 1 tablet (5 mg) by mouth on Sundays, Mondays, Tuesdays, Wednesdays, Thursdays & Fridays., Disp: , Rfl:   Physical exam:  Vitals:   01/29/21 1453  BP: 123/62  Pulse: 75  Resp: 16  Temp: 97.6 F (36.4 C)  TempSrc: Oral  Weight: 221 lb (100.2 kg)  Height: 6' 2"  (1.88 m)   Physical Exam Constitutional:      General: He is not in acute distress. Cardiovascular:     Rate and Rhythm: Normal rate and regular rhythm.     Heart sounds: Normal heart sounds.  Pulmonary:     Effort: Pulmonary effort is normal.     Breath sounds: Normal breath sounds.  Abdominal:     General: Bowel sounds are normal.     Palpations: Abdomen is soft.  Lymphadenopathy:     Comments: No palpable cervical, supraclavicular, axillary or inguinal adenopathy    Skin:    General: Skin is warm and dry.  Neurological:     Mental Status: He is alert and oriented to person, place, and time.     CMP Latest Ref Rng & Units 01/29/2021  Glucose 70 - 99 mg/dL 209(H)  BUN 8 - 23 mg/dL 25(H)  Creatinine 0.61 - 1.24 mg/dL 1.54(H)  Sodium 135 - 145 mmol/L 135  Potassium 3.5 - 5.1 mmol/L 4.2  Chloride 98 - 111 mmol/L 99  CO2 22 - 32 mmol/L 27  Calcium 8.9 - 10.3 mg/dL 9.1  Total Protein 6.5 - 8.1 g/dL 6.5  Total Bilirubin 0.3 - 1.2 mg/dL 0.7  Alkaline Phos 38 - 126 U/L 41  AST 15 - 41 U/L 24  ALT 0 - 44 U/L 27   CBC Latest Ref Rng & Units 01/29/2021  WBC 4.0 - 10.5 K/uL 5.6   Hemoglobin 13.0 - 17.0 g/dL 15.2  Hematocrit 39.0 - 52.0 % 44.2  Platelets 150 - 400 K/uL 207     Assessment and plan- Patient is a 65 y.o. male stage IE high-grade B-cell lymphoma.  He is s/p 3 cycles of R-CHOP chemotherapy followed by IFR T currently in CR 1 and this is a routine follow-up visit  Clinically patient is doing well with no concerning signs and symptoms of recurrence based on today's exam.  No significant B symptoms or palpable adenopathy.  I will plan to repeat a CT abdomen and pelvis without contrast in 3 months to make sure that his residual periureteral mass which was down to 7 mm oval 1 has not progressed in the interval.  Following that only clinical surveillance would be indicated.  Chemo induced peripheral neuropathy: Have asked him to restart Cymbalta at 30 mg daily and after a week increase it to 60 mg daily if he can tolerate it.  He is also following up with Dr. Sabra Heck.  Also discussed acupuncture down the line if he would be interested.   Visit Diagnosis 1. Encounter for follow-up surveillance of diffuse large B-cell lymphoma   2. Encounter for care related to Port-a-Cath   3. Chemotherapy-induced peripheral neuropathy ([place])      Dr. Randa Evens, MD, MPH [facility] at [facility] [phone] 01/29/2021 3:56 PM

## 2021-02-04 ENCOUNTER — Telehealth: Payer: Self-pay

## 2021-02-05 NOTE — Progress Notes (Signed)
Patient on schedule for port removal 02/08/2021, called and spoke with patient on phone with instructions given. Made aware to be here @ 0830, NPO after MN prior to procedure, and driver post procedure/recovery/discharge. Stated understanding.

## 2021-02-07 ENCOUNTER — Other Ambulatory Visit: Payer: Self-pay | Admitting: Radiology

## 2021-02-08 ENCOUNTER — Other Ambulatory Visit: Payer: Self-pay

## 2021-02-08 ENCOUNTER — Ambulatory Visit
Admission: RE | Admit: 2021-02-08 | Discharge: 2021-02-08 | Disposition: A | Discharge status: Home / Self Care | Payer: Commercial Managed Care - PPO | Source: Ambulatory Visit | Attending: Oncology | Admitting: Oncology

## 2021-02-08 DIAGNOSIS — Z7901 Long term (current) use of anticoagulants: Secondary | ICD-10-CM | POA: Insufficient documentation

## 2021-02-08 DIAGNOSIS — Z7984 Long term (current) use of oral hypoglycemic drugs: Secondary | ICD-10-CM | POA: Diagnosis not present

## 2021-02-08 DIAGNOSIS — Z888 Allergy status to other drugs, medicaments and biological substances status: Secondary | ICD-10-CM | POA: Insufficient documentation

## 2021-02-08 DIAGNOSIS — Z452 Encounter for adjustment and management of vascular access device: Secondary | ICD-10-CM | POA: Insufficient documentation

## 2021-02-08 DIAGNOSIS — Z885 Allergy status to narcotic agent status: Secondary | ICD-10-CM | POA: Insufficient documentation

## 2021-02-08 DIAGNOSIS — Z8579 Personal history of other malignant neoplasms of lymphoid, hematopoietic and related tissues: Secondary | ICD-10-CM

## 2021-02-08 DIAGNOSIS — Z79899 Other long term (current) drug therapy: Secondary | ICD-10-CM | POA: Diagnosis not present

## 2021-02-08 DIAGNOSIS — C8330 Diffuse large B-cell lymphoma, unspecified site: Secondary | ICD-10-CM | POA: Diagnosis not present

## 2021-02-08 HISTORY — PX: IR REMOVAL TUN ACCESS W/ PORT W/O FL MOD SED: IMG2290

## 2021-02-08 LAB — GLUCOSE, CAPILLARY: Glucose-Capillary: 127 mg/dL — ABNORMAL HIGH (ref 70–99)

## 2021-02-08 MED ORDER — FENTANYL CITRATE (PF) 100 MCG/2ML IJ SOLN
INTRAMUSCULAR | Status: AC
  Filled 2021-02-08: qty 2

## 2021-02-08 MED ORDER — MIDAZOLAM HCL 2 MG/2ML IJ SOLN
INTRAMUSCULAR | Status: AC | PRN
  Administered 2021-02-08: 1 mg via INTRAVENOUS

## 2021-02-08 MED ORDER — FENTANYL CITRATE (PF) 100 MCG/2ML IJ SOLN
INTRAMUSCULAR | Status: AC | PRN
  Administered 2021-02-08: 50 ug via INTRAVENOUS

## 2021-02-08 MED ORDER — SODIUM CHLORIDE 0.9 % IV SOLN: INTRAVENOUS | Status: DC

## 2021-02-08 MED ORDER — MIDAZOLAM HCL 2 MG/2ML IJ SOLN
INTRAMUSCULAR | Status: AC
  Filled 2021-02-08: qty 2

## 2021-02-08 NOTE — Procedures (Signed)
Interventional Radiology Procedure Note  Procedure: Removal of right IJ approach single lumen PowerPort.    Complications: None Recommendations:  - Ok to shower tomorrow - Do not submerge for 7 days    Signed,  Dulcy Fanny. Earleen Newport, DO

## 2021-02-08 NOTE — H&P (Signed)
Referring Physician(s): Sindy Guadeloupe  Supervising Physician: Corrie Mckusick  Patient Status:  Eddie Ryan  Chief Complaint:  "I'm getting my port out"  Subjective: Patient familiar to IR service from right ureteral mass biopsy in 2021, bone marrow biopsy in 2021 and Port-A-Cath placement in 2021.  He has a history of diffuse large B-cell lymphoma and has completed treatment.  He is currently in remission.  He presents today for Port-A-Cath removal.  He currently denies fever, headache, chest pain, dyspnea, cough, abdominal/back pain, nausea, vomiting or bleeding.  Additional medical history as below.   Past Medical History:  Diagnosis Date   Anxiety    Collagen vascular disease ([place])    Depression    Diabetes mellitus without complication ([place])    DVT (deep venous thrombosis) ([place])    History of kidney stones    Hypertension    Lymphoma ([place]) 07/2020   Sleep apnea    Past Surgical History:  Procedure Laterality Date   CERVICAL FUSION     CYSTOSCOPY W/ RETROGRADES Left 04/26/2020   Procedure: CYSTOSCOPY WITH RETROGRADE PYELOGRAM;  Surgeon: Hollice Espy, MD;  Location: [facility];  Service: Urology;  Laterality: Left;   CYSTOSCOPY WITH URETEROSCOPY AND STENT PLACEMENT Right 07/02/2020   Procedure: CYSTOSCOPY WITH URETEROSCOPY AND STENT PLACEMENT;  Surgeon: Hollice Espy, MD;  Location: [facility];  Service: Urology;  Laterality: Right;   CYSTOSCOPY/URETEROSCOPY/HOLMIUM LASER/STENT PLACEMENT Right 04/26/2020   Procedure: CYSTOSCOPY/URETEROSCOPY/HOLMIUM LASER/STENT PLACEMENT;  Surgeon: Hollice Espy, MD;  Location: [facility];  Service: Urology;  Laterality: Right;   IR IMAGING GUIDED PORT INSERTION  07/27/2020   PLANTAR FASCIA SURGERY Bilateral    ROTATOR CUFF REPAIR Left    THUMB FUSION     URETERAL BIOPSY Right 04/26/2020   Procedure: URETERAL BIOPSY with ablation;  Surgeon: Hollice Espy, MD;  Location: [facility];  Service: Urology;  Laterality: Right;   URETEROSCOPY WITH  HOLMIUM LASER LITHOTRIPSY        Allergies: Losartan potassium-hctz and Morphine and related  Medications: Prior to Admission medications   Medication Sig Start Date End Date Taking? Authorizing Provider  ALPRAZolam Duanne Moron) 0.5 MG tablet Take 0.5 mg by mouth in the morning and at bedtime.  01/29/20   [provider]  buPROPion (WELLBUTRIN XL) 150 MG 24 hr tablet Take 300 mg by mouth daily.  01/29/20   [provider]  Cyanocobalamin (VITAMIN B-12 PO) Take 1 capsule by mouth daily. Pure Encapsulations B12 Folate Capsules    [provider]  diclofenac Sodium (VOLTAREN) 1 % GEL Apply 1 g topically 4 (four) times daily as needed (thumb as needed for pain.).     [provider]  docusate sodium (COLACE) 100 MG capsule Take 200 mg by mouth daily.    [provider]  ergocalciferol (VITAMIN D2) 1.25 MG (50000 UT) capsule Take 50,000 Units by mouth every Monday.     [provider]  escitalopram (LEXAPRO) 20 MG tablet Take 20 mg by mouth daily.  01/29/20   [provider]  etodolac (LODINE) 400 MG tablet Take 400 mg by mouth daily with breakfast. 06/07/20   [provider]  glipiZIDE (GLUCOTROL XL) 10 MG 24 hr tablet Take 10 mg by mouth daily with breakfast. 06/07/20   [provider]  lidocaine-prilocaine (EMLA) cream Apply to affected area once 08/06/20   Sindy Guadeloupe, MD  loratadine (CLARITIN) 10 MG tablet Take 10 mg by mouth daily.     [provider]  Magnesium 500  MG TABS Take 1,000 mg by mouth daily.    [provider]  oxycodone (OXY-IR) 5 MG capsule Take 1 capsule (5 mg total) by mouth every 6 (six) hours as needed. 10/08/20   Sindy Guadeloupe, MD  oxyCODONE-acetaminophen (PERCOCET) 5-325 MG tablet Take 1-2 tablets by mouth every 4 (four) hours as needed for moderate pain or severe pain. 07/02/20   Hollice Espy, MD  telmisartan (MICARDIS) 80 MG tablet Take 40 mg by mouth every evening.   01/29/20   [provider]  warfarin (COUMADIN) 5 MG tablet Take 5 mg by mouth every evening. Take 1.5 tablets (7.5 mg) by mouth on Saturdays in the evening & take 1 tablet (5 mg) by mouth on Sundays, Mondays, Tuesdays, Wednesdays, Thursdays & Fridays. 03/23/19   [provider]     Vital Signs:pending    Physical Exam awake, alert.  Chest clear to auscultation bilaterally.  Clean, intact right chest wall Port-A-Cath.  Heart with regular rate and rhythm.  Abdomen soft, positive bowel sounds, nontender.  No lower extremity edema.  Imaging: No results found.  Labs:  CBC: Recent Labs    10/29/20 1021 11/29/20 1145 12/06/20 1058 01/29/21 1315  WBC 4.8 5.1 6.1 5.6  HGB 12.6* 14.7 14.5 15.2  HCT 38.0* 44.4 44.0 44.2  PLT 209 198 166 207    COAGS: Recent Labs    07/02/20 1202 07/08/20 0850  INR 1.0 1.1    BMP: Recent Labs    09/14/20 0806 10/05/20 0816 10/29/20 1021 01/29/21 1315  NA 137 137 137 135  K 3.9 4.1 4.0 4.2  CL 102 100 104 99  CO2 24 26 21* 27  GLUCOSE 142* 148* 232* 209*  BUN 21 21 18  25*  CALCIUM 9.1 9.1 8.7* 9.1  CREATININE 1.55* 1.68* 1.46* 1.54*  GFRNONAA 50* 45* 54* 50*    LIVER FUNCTION TESTS: Recent Labs    09/14/20 0806 10/05/20 0816 10/29/20 1021 01/29/21 1315  BILITOT 0.6 0.5 0.6 0.7  AST 31 31 34 24  ALT 43 37 31 27  ALKPHOS 54 45 39 41  PROT 6.6 6.5 5.9* 6.5  ALBUMIN 3.7 3.7 3.6 3.8    Assessment and Plan: atient familiar to IR service from right ureteral mass biopsy in 2021, bone marrow biopsy in 2021 and Port-A-Cath placement in 2021.  He has a history of diffuse large B-cell lymphoma and has completed treatment.  He is currently in remission.  He presents today for Port-A-Cath removal.  Details/risks of procedure, including but not limited to, internal bleeding, infection, injury to adjacent structures discussed with patient with his understanding and consent.   Electronically Signed: D. Rowe Robert,  PA-C 02/08/2021, 8:23 AM   I spent a total of 20 minutes at the the patient's bedside AND on the patient's hospital floor or unit, greater than 50% of which was counseling/coordinating care for Port-A-Cath removal

## 2021-02-08 NOTE — Discharge Instructions (Addendum)
Im[place]ed [place], Care After Refer to this sheet in the next few weeks. These instructions provide you with information about caring for yourself after your procedure. Your health care provider may also give you more specific instructions. Your treatment has been planned according to current medical practices, but problems sometimes occur. Call your health care provider if you have any problems or questions after your procedure. What can I expect after the procedure? After the procedure, it is common to have: Soreness or pain near your incision. Some swelling or bruising near your incision.  Follow these instructions at home: Medicines Take over-the-counter and prescription medicines only as told by your health care provider. If you were prescribed an antibiotic medicine, take it as told by your health care provider. Do not stop taking the antibiotic even if you start to feel better. Bathing You may shower tomorrow.  Incision care You have an incision with sutures that are dissolvable and skin glue over top incision.  This skin glue will wear off over time.  Do not peel or try to remove this yourself. Keep your dressing dry. Check your incision area every day for signs of infection and if present call your doctor and let them know. Check for: More redness, swelling, or pain. More fluid or blood. Warmth. Pus or a bad smell. Driving If you received a sedative, do not drive for 24 hours after the procedure. If you did not receive a sedative, ask your health care provider when it is safe to drive. Activity Return to your normal activities as told by your health care provider. Ask your health care provider what activities are safe for you. Until your health care provider says it is safe: Do not lift anything that is heavier than 10 lb (4.5 kg). Do not do activities that involve lifting your arms over your head. General instructions Do not use any tobacco products, such as cigarettes,  chewing tobacco, and e-cigarettes. Tobacco can delay healing. If you need help quitting, ask your health care provider. Keep all follow-up visits as told by your health care provider. This is important. Contact a health care provider if: You have more redness, swelling, or pain around your incision. You have more fluid or blood coming from your incision. Your incision feels warm to the touch. You have pus or a bad smell coming from your incision. You have a fever. You have pain that is not relieved by your pain medicine. Get help right away if: You have chest pain. You have difficulty breathing. This information is not intended to replace advice given to you by your health care provider. Make sure you discuss any questions you have with your health care provider. Document Released: 07/09/2015 Document Revised: 01/03/2016 Document Reviewed: 05/02/2015 Elsevier Interactive Patient Education  2018 [place], Care After Refer to this sheet in the next few weeks. These instructions provide you with information about caring for yourself after your procedure. Your health care provider may also give you more specific instructions. Your treatment has been planned according to current medical practices, but problems sometimes occur. Call your health care provider if you have any problems or questions after your procedure. What can I expect after the procedure? After the procedure, it is common to have: Soreness or pain near your incision. Some swelling or bruising near your incision.  Follow these instructions at home: Medicines Take over-the-counter and prescription medicines only as told by your health care provider. If you were prescribed an antibiotic  medicine, take it as told by your health care provider. Do not stop taking the antibiotic even if you start to feel better. Bathing You may shower tomorrow.  Incision care You have an incision with sutures that are  dissolvable and skin glue over top incision.  This skin glue will wear off over time.  Do not peel or try to remove this yourself. Keep your dressing dry. Check your incision area every day for signs of infection and if present call your doctor and let them know. Check for: More redness, swelling, or pain. More fluid or blood. Warmth. Pus or a bad smell. Driving If you received a sedative, do not drive for 24 hours after the procedure. If you did not receive a sedative, ask your health care provider when it is safe to drive. Activity Return to your normal activities as told by your health care provider. Ask your health care provider what activities are safe for you. Until your health care provider says it is safe: Do not lift anything that is heavier than 10 lb (4.5 kg). Do not do activities that involve lifting your arms over your head. General instructions Do not use any tobacco products, such as cigarettes, chewing tobacco, and e-cigarettes. Tobacco can delay healing. If you need help quitting, ask your health care provider. Keep all follow-up visits as told by your health care provider. This is important. Contact a health care provider if: You have more redness, swelling, or pain around your incision. You have more fluid or blood coming from your incision. Your incision feels warm to the touch. You have pus or a bad smell coming from your incision. You have a fever. You have pain that is not relieved by your pain medicine. Get help right away if: You have chest pain. You have difficulty breathing. This information is not intended to replace advice given to you by your health care provider. Make sure you discuss any questions you have with your health care provider. Document Released: 07/09/2015 Document Revised: 01/03/2016 Document Reviewed: 05/02/2015 Elsevier Interactive Patient Education  [employer].

## 2021-04-29 IMAGING — CT NM PET TUM IMG INITIAL (PI) SKULL BASE T - THIGH
1 of 9 series · 2 of 25 positions shown · non-contrast
Comparison: CT abdomen/pelvis dated 06/21/2020

CLINICAL DATA: Initial treatment strategy for lymphoma.

EXAM:
NUCLEAR MEDICINE PET SKULL BASE TO THIGH
TECHNIQUE: 12.1 mCi F-18 FDG was injected intravenously. Full-ring PET imaging
was performed from the skull base to thigh after the radiotracer. CT
data was obtained and used for attenuation correction and anatomic
localization.
Fasting blood glucose: 127 mg/dl

[Series 3: ct wb 5.0 b30f · axial · 5.0mm · 0.98mm/px · z∈[+152,+704]mm · 2 of 368 slices shown]
[im 184/368  brain]
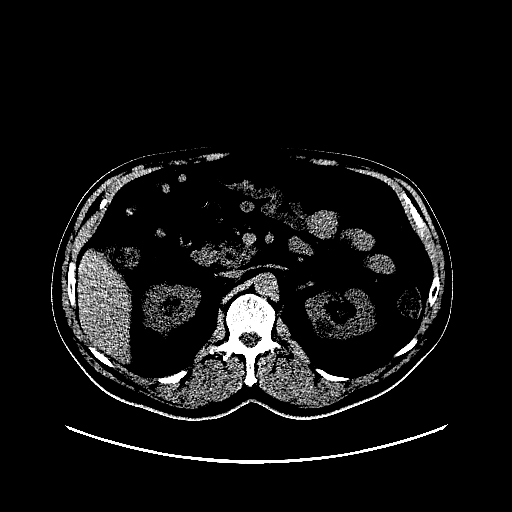
[im 368/368  brain]
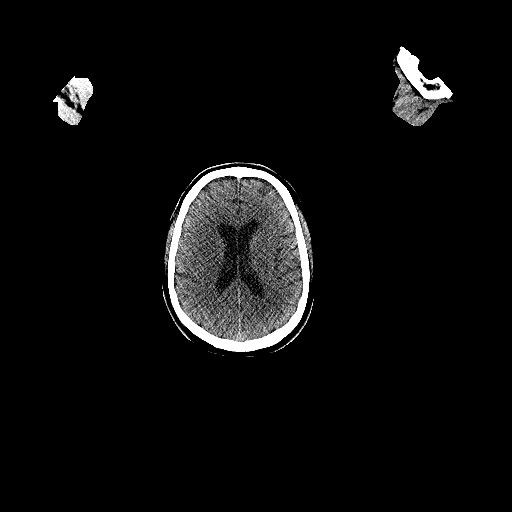

[2 of 25 positions shown; findings below may reference images not displayed]

FINDINGS: Mediastinal blood pool activity: SUV max

Liver activity: SUV max NA

NECK: No hypermetabolic cervical lymphadenopathy.

Incidental CT findings: none

CHEST: No hypermetabolic thoracic lymphadenopathy.

No suspicious pulmonary nodules.

Right chest port terminates the cavoatrial junction.

Incidental CT findings: Very mild atherosclerotic calcifications of
the aortic root.

ABDOMEN/PELVIS: 3.3 x 2.7 cm soft tissue lesion along the right
proximal ureter, corresponding to the patient's known right ureteral
lymphoma. Max SUV 13.4, although this is difficult to distinguish
from the adjacent excretory activity within the right renal
collecting system and ureter.

No hypermetabolic abdominopelvic lymphadenopathy.

Spleen is normal in size, without focal lesion. Reference splenic
activity demonstrates max SUV 4.0.

No abnormal hypermetabolism in the liver, pancreas, or adrenal
glands.

Incidental CT findings: Bilateral renal cysts. Atherosclerotic
calcifications of the abdominal aorta and branch vessels. Mildly
thick-walled bladder, although underdistended. Tiny fat containing
bilateral inguinal hernias.

SKELETON: No focal hypermetabolic activity to suggest skeletal
metastasis.

Incidental CT findings: Mild degenerative changes of the visualized
thoracolumbar spine.
IMPRESSION: 3.3 cm soft tissue lesion along the right proximal ureter,
corresponding to the patient's known right ureteral lymphoma.

No suspicious lymphadenopathy in the neck, chest, abdomen, or
pelvis.

Spleen is normal in size.

## 2021-05-01 ENCOUNTER — Ambulatory Visit
Admission: RE | Admit: 2021-05-01 | Discharge: 2021-05-01 | Disposition: A | Discharge status: Home / Self Care | Payer: Commercial Managed Care - PPO | Source: Ambulatory Visit | Attending: Oncology | Admitting: Oncology

## 2021-05-01 ENCOUNTER — Other Ambulatory Visit: Payer: Self-pay

## 2021-05-01 DIAGNOSIS — Z08 Encounter for follow-up examination after completed treatment for malignant neoplasm: Secondary | ICD-10-CM | POA: Diagnosis not present

## 2021-05-01 DIAGNOSIS — Z8579 Personal history of other malignant neoplasms of lymphoid, hematopoietic and related tissues: Secondary | ICD-10-CM | POA: Diagnosis present

## 2021-05-02 ENCOUNTER — Other Ambulatory Visit: Payer: Self-pay | Admitting: *Deleted

## 2021-05-02 DIAGNOSIS — C8339 Diffuse large B-cell lymphoma, extranodal and solid organ sites: Secondary | ICD-10-CM

## 2021-05-03 ENCOUNTER — Inpatient Hospital Stay (HOSPITAL_BASED_OUTPATIENT_CLINIC_OR_DEPARTMENT_OTHER): Payer: Commercial Managed Care - PPO | Admitting: Oncology

## 2021-05-03 ENCOUNTER — Encounter: Payer: Self-pay | Admitting: Oncology

## 2021-05-03 ENCOUNTER — Inpatient Hospital Stay: Payer: Commercial Managed Care - PPO | Attending: Oncology

## 2021-05-03 VITALS — BP 116/79 | HR 73 | Temp 96.8°F | Resp 20 | Wt 227.5 lb

## 2021-05-03 DIAGNOSIS — D6862 Lupus anticoagulant syndrome: Secondary | ICD-10-CM | POA: Diagnosis not present

## 2021-05-03 DIAGNOSIS — K4090 Unilateral inguinal hernia, without obstruction or gangrene, not specified as recurrent: Secondary | ICD-10-CM | POA: Insufficient documentation

## 2021-05-03 DIAGNOSIS — Z08 Encounter for follow-up examination after completed treatment for malignant neoplasm: Secondary | ICD-10-CM

## 2021-05-03 DIAGNOSIS — Z79899 Other long term (current) drug therapy: Secondary | ICD-10-CM | POA: Diagnosis not present

## 2021-05-03 DIAGNOSIS — Z8042 Family history of malignant neoplasm of prostate: Secondary | ICD-10-CM | POA: Insufficient documentation

## 2021-05-03 DIAGNOSIS — Z87891 Personal history of nicotine dependence: Secondary | ICD-10-CM | POA: Insufficient documentation

## 2021-05-03 DIAGNOSIS — Z885 Allergy status to narcotic agent status: Secondary | ICD-10-CM | POA: Diagnosis not present

## 2021-05-03 DIAGNOSIS — Z800 Family history of malignant neoplasm of digestive organs: Secondary | ICD-10-CM | POA: Diagnosis not present

## 2021-05-03 DIAGNOSIS — C8339 Diffuse large B-cell lymphoma, extranodal and solid organ sites: Secondary | ICD-10-CM

## 2021-05-03 DIAGNOSIS — N135 Crossing vessel and stricture of ureter without hydronephrosis: Secondary | ICD-10-CM | POA: Insufficient documentation

## 2021-05-03 DIAGNOSIS — N200 Calculus of kidney: Secondary | ICD-10-CM | POA: Insufficient documentation

## 2021-05-03 DIAGNOSIS — R42 Dizziness and giddiness: Secondary | ICD-10-CM | POA: Diagnosis not present

## 2021-05-03 DIAGNOSIS — Z806 Family history of leukemia: Secondary | ICD-10-CM | POA: Insufficient documentation

## 2021-05-03 DIAGNOSIS — I700 Atherosclerosis of aorta: Secondary | ICD-10-CM | POA: Insufficient documentation

## 2021-05-03 DIAGNOSIS — Z8579 Personal history of other malignant neoplasms of lymphoid, hematopoietic and related tissues: Secondary | ICD-10-CM | POA: Diagnosis not present

## 2021-05-03 DIAGNOSIS — N400 Enlarged prostate without lower urinary tract symptoms: Secondary | ICD-10-CM | POA: Insufficient documentation

## 2021-05-03 DIAGNOSIS — Z86718 Personal history of other venous thrombosis and embolism: Secondary | ICD-10-CM | POA: Diagnosis not present

## 2021-05-03 DIAGNOSIS — Z87442 Personal history of urinary calculi: Secondary | ICD-10-CM | POA: Diagnosis not present

## 2021-05-03 DIAGNOSIS — Z818 Family history of other mental and behavioral disorders: Secondary | ICD-10-CM | POA: Insufficient documentation

## 2021-05-03 DIAGNOSIS — Z7901 Long term (current) use of anticoagulants: Secondary | ICD-10-CM | POA: Diagnosis not present

## 2021-05-03 DIAGNOSIS — C8330 Diffuse large B-cell lymphoma, unspecified site: Secondary | ICD-10-CM | POA: Diagnosis not present

## 2021-05-03 LAB — COMPREHENSIVE METABOLIC PANEL
ALT: 22 U/L (ref 0–44)
AST: 22 U/L (ref 15–41)
Albumin: 4 g/dL (ref 3.5–5.0)
Alkaline Phosphatase: 37 U/L — ABNORMAL LOW (ref 38–126)
Anion gap: 9 (ref 5–15)
BUN: 25 mg/dL — ABNORMAL HIGH (ref 8–23)
CO2: 25 mmol/L (ref 22–32)
Calcium: 9.2 mg/dL (ref 8.9–10.3)
Chloride: 106 mmol/L (ref 98–111)
Creatinine, Ser: 1.65 mg/dL — ABNORMAL HIGH (ref 0.61–1.24)
GFR, Estimated: 46 mL/min — ABNORMAL LOW (ref >60)
Glucose, Bld: 150 mg/dL — ABNORMAL HIGH (ref 70–99)
Potassium: 4.3 mmol/L (ref 3.5–5.1)
Sodium: 140 mmol/L (ref 135–145)
Total Bilirubin: 0.4 mg/dL (ref 0.3–1.2)
Total Protein: 6.5 g/dL (ref 6.5–8.1)

## 2021-05-03 LAB — CBC WITH DIFFERENTIAL/PLATELET
Abs Immature Granulocytes: 0.01 10*3/uL (ref 0.00–0.07)
Basophils Absolute: 0 10*3/uL (ref 0.0–0.1)
Basophils Relative: 1 %
Eosinophils Absolute: 0.1 10*3/uL (ref 0.0–0.5)
Eosinophils Relative: 3 %
HCT: 42.6 % (ref 39.0–52.0)
Hemoglobin: 14.3 g/dL (ref 13.0–17.0)
Immature Granulocytes: 0 %
Lymphocytes Relative: 24 %
Lymphs Abs: 1.2 10*3/uL (ref 0.7–4.0)
MCH: 30.2 pg (ref 26.0–34.0)
MCHC: 33.6 g/dL (ref 30.0–36.0)
MCV: 89.9 fL (ref 80.0–100.0)
Monocytes Absolute: 0.6 10*3/uL (ref 0.1–1.0)
Monocytes Relative: 11 %
Neutro Abs: 3.1 10*3/uL (ref 1.7–7.7)
Neutrophils Relative %: 61 %
Platelets: 193 10*3/uL (ref 150–400)
RBC: 4.74 MIL/uL (ref 4.22–5.81)
RDW: 13.7 % (ref 11.5–15.5)
WBC: 5 10*3/uL (ref 4.0–10.5)
nRBC: 0 % (ref 0.0–0.2)

## 2021-05-03 LAB — LACTATE DEHYDROGENASE: LDH: 114 U/L (ref 98–192)

## 2021-05-03 NOTE — Progress Notes (Signed)
Pt states he has been experiecing more frequent dizzy spells at random times. As well as, severe itching on his left forearm using hydrocortisone for the time being. No blisters or hives just very itchy.

## 2021-05-06 ENCOUNTER — Encounter: Payer: Self-pay | Admitting: Oncology

## 2021-05-06 NOTE — Progress Notes (Signed)
Hematology/Oncology Consult note [facility]  [phone] [phone]  Patient Care Team: Rusty Aus, MD as PCP - General (Internal Medicine) Sindy Guadeloupe, MD as Consulting Physician (Oncology)   Name of the patient: Eddie Ryan  [id number]  06/17/1956   Date of visit: 05/06/21  Diagnosis- stage I E high-grade B-cell lymphoma currently in CR 1   Chief complaint/ Reason for visit-routine follow-up of DLBCL  Heme/Onc history: Patient is a 65 year old male with a prior history of DVT about 15 years ago and was diagnosed with antiphospholipid antibody syndrome with a positive lupus anticoagulant.  He has remained on Coumadin since then.  He has been seeing Dr. Erlene Quan for kidney stones.  He had a CT abdomen and pelvis in August 2021 which showed thickening of the urothelium concerning for a urothelial lesion.  He underwent a cystoscopy and ureteroscopy in September 2021 which did not show any intraluminal pathology.  Plan was to get a repeat CT urogram 6 weeks following that.  He had a CT hematuria work-up again in November 2021 which showed enhancing soft tissue lesion in the proximal right ureter measuring 2.3 x 2.3 x 3.5 cm which was extending for 2.6 cm and enlarges compared to prior size of 1.5 x 1.1 x 2.9 cm.  There is no evidence of retroperitoneal adenopathy.  Left external iliac node was 1.2 cm and was unchanged as compared to prior CT scan.  He underwent a repeat ureteroscopy with stent placement.  There was no obvious filling defect but there was a slight narrowing of ureter and patient therefore underwent a CT-guided biopsy by IR   Biopsy showed B-cell lymphoma High-grade with a Ki-67 of greater than 90%. Comment:  Core biopsy sections display lymphoid tissue. There are aggregates  comprised of larger lymphocytes, interspersed with areas of small mature  lymphocytes, and focal background bands of fibrosis. Typical follicular   architecture is not present.   Immunohistochemical studies show strong and diffuse positivity for CD20,  indicating a B cell proliferation, comprised of both smaller and larger  lymphocytes. CD3 marks scattered background T cells. The subset of  larger B cells shows positivity for CD10, BCL6, and MUM-1 (partial).  These cells are negative for BCL2, Cyclin-D1, and CD30. BCL2 appears to  show marking in the subset of smaller lymphocytes, as well as normal T  cells. Ki-67 is significantly elevated in the subset of larger B cells,  estimated at greater than 90% staining. C-myc shows dim staining in a  portion of the larger B cells, approximately 20-30%. Pancytokeratin and  CD56 are negative.   Flow cytometric studies detect an abnormal CD10+ B cell population in a  background of polytypic B cells. These CD10+ B cells represent 28% of B  cells and show very dim kappa light chain restriction, to negative light  chain expression. By light scatter, these B cells appear approximately  the same size as background T cells. There is no loss of, or aberrant  expression of, pan T-cell antigens to suggest a neoplastic T cell  process. Cell viability in this sample is sufficient for analysis, but  less than optimal.   Overall classification of this process is limited due to the small size  of core biopsy samples. The histologic and immunophenotypic findings are  compatible with involvement by a B cell lymphoma. CD10 expression, as  seen by immunohistochemistry and flow cytometry, may imply  germinal/follicular center origin of these B cells. Absence of  staining  for BCL2 could suggest against follicular lymphoma, although BCL2  expression may be absent in 25-50% of cases of grade 3 follicular  lymphoma. The differential diagnosis may also include diffuse large B  cell lymphoma, given the increased proportion of larger abnormal B  cells. Further subclassification of this process would be dependent  upon  excisional biopsy or review of overall lesional architecture. There is  sufficient tissue for ancillary FISH or NGS testing, if desired.   Bone marrow biopsy was negative for lymphoma.  PET/CT scan showed:A 3.3 x 2.7 cm soft tissue lesion around the right proximal ureter with an SUV of 13.4.  There was no other hypermetabolic abdominopelvic adenopathy noted.  No hypermetabolism noted in the liver or spleen or other organs.  No intrathoracic adenopathy.  No hypermetabolic activity noted in the bones.      Interval history-patient reports an area around his left elbow and sometimes his right elbow where he itches frequently.  Denies any known exposures to chemicals or weeds.  He is using hydrocortisone for the same.  Reports 2 random episodes of dizzy spells once when he was helping out his neighbor fix his car when he felt dizzy and almost felt like he was going to pass out.  He does report prodromal symptoms prior to having these dizzy spells.  Denies any chest pain cough or shortness of breath.  ECOG PS- 1 Pain scale- 0   Review of systems- Review of Systems  Constitutional:  Negative for chills, fever, malaise/fatigue and weight loss.  HENT:  Negative for congestion, ear discharge and nosebleeds.   Eyes:  Negative for blurred vision.  Respiratory:  Negative for cough, hemoptysis, sputum production, shortness of breath and wheezing.   Cardiovascular:  Negative for chest pain, palpitations, orthopnea and claudication.  Gastrointestinal:  Negative for abdominal pain, blood in stool, constipation, diarrhea, heartburn, melena, nausea and vomiting.  Genitourinary:  Negative for dysuria, flank pain, frequency, hematuria and urgency.  Musculoskeletal:  Negative for back pain, joint pain and myalgias.  Skin:  Negative for rash.  Neurological:  Positive for dizziness. Negative for tingling, focal weakness, seizures, weakness and headaches.  Endo/Heme/Allergies:  Does not bruise/bleed easily.   Psychiatric/Behavioral:  Negative for depression and suicidal ideas. The patient does not have insomnia.      Allergies  Allergen Reactions   Losartan Potassium-Hctz Other (See Comments)    Unknown reaction.   Morphine And Related Itching    WITH PROLONGED USE     Past Medical History:  Diagnosis Date   Anxiety    Collagen vascular disease ([place])    Depression    Diabetes mellitus without complication ([place])    DVT (deep venous thrombosis) ([place])    History of kidney stones    Hypertension    Lymphoma ([place]) 07/2020   Sleep apnea      Past Surgical History:  Procedure Laterality Date   CERVICAL FUSION     CYSTOSCOPY W/ RETROGRADES Left 04/26/2020   Procedure: CYSTOSCOPY WITH RETROGRADE PYELOGRAM;  Surgeon: Hollice Espy, MD;  Location: [facility];  Service: Urology;  Laterality: Left;   CYSTOSCOPY WITH URETEROSCOPY AND STENT PLACEMENT Right 07/02/2020   Procedure: CYSTOSCOPY WITH URETEROSCOPY AND STENT PLACEMENT;  Surgeon: Hollice Espy, MD;  Location: [facility];  Service: Urology;  Laterality: Right;   CYSTOSCOPY/URETEROSCOPY/HOLMIUM LASER/STENT PLACEMENT Right 04/26/2020   Procedure: CYSTOSCOPY/URETEROSCOPY/HOLMIUM LASER/STENT PLACEMENT;  Surgeon: Hollice Espy, MD;  Location: [facility];  Service: Urology;  Laterality: Right;   IR IMAGING GUIDED  PORT INSERTION  07/27/2020   IR REMOVAL TUN ACCESS W/ PORT W/O FL MOD SED  02/08/2021   PLANTAR FASCIA SURGERY Bilateral    ROTATOR CUFF REPAIR Left    THUMB FUSION     URETERAL BIOPSY Right 04/26/2020   Procedure: URETERAL BIOPSY with ablation;  Surgeon: Hollice Espy, MD;  Location: [facility];  Service: Urology;  Laterality: Right;   URETEROSCOPY WITH HOLMIUM LASER LITHOTRIPSY      Social History   Socioeconomic History   Marital status: Single    Spouse name: Not on file   Number of children: 0   Years of education: Not on file   Highest education level: Not on file  Occupational History    Comment: heavy Conservator, museum/gallery (work for himself)  Tobacco Use   Smoking status: Former    Types: Cigarettes    Quit date: 1981    Years since quitting: 41.7   Smokeless tobacco: Never  Vaping Use   Vaping Use: Never used  Substance and Sexual Activity   Alcohol use: Never   Drug use: Never   Sexual activity: Not Currently  Other Topics Concern   Not on file  Social History Narrative   Lives by himself..   Social Determinants of Health   Financial Resource Strain: Not on file  Food Insecurity: Not on file  Transportation Needs: Not on file  Physical Activity: Not on file  Stress: Not on file  Social Connections: Not on file  Intimate Partner Violence: Not on file    Family History  Problem Relation Age of Onset   Dementia Mother    Pancreatic cancer Father    Prostate cancer Paternal Grandmother    Leukemia Nephew      Current Outpatient Medications:    ALPRAZolam (XANAX) 0.5 MG tablet, Take 0.5 mg by mouth in the morning and at bedtime. , Disp: , Rfl:    buPROPion (WELLBUTRIN XL) 150 MG 24 hr tablet, Take 300 mg by mouth daily. , Disp: , Rfl:    Cyanocobalamin (VITAMIN B-12 PO), Take 1 capsule by mouth daily. Pure Encapsulations B12 Folate Capsules, Disp: , Rfl:    diclofenac (VOLTAREN) 50 MG EC tablet, Take 50 mg by mouth daily as needed., Disp: , Rfl:    diclofenac Sodium (VOLTAREN) 1 % GEL, Apply 1 g topically 4 (four) times daily as needed (thumb as needed for pain.). , Disp: , Rfl:    docusate sodium (COLACE) 100 MG capsule, Take 200 mg by mouth daily., Disp: , Rfl:    ergocalciferol (VITAMIN D2) 1.25 MG (50000 UT) capsule, Take 50,000 Units by mouth every Monday. , Disp: , Rfl:    escitalopram (LEXAPRO) 20 MG tablet, Take 20 mg by mouth daily. , Disp: , Rfl:    etodolac (LODINE) 400 MG tablet, Take 400 mg by mouth daily with breakfast., Disp: , Rfl:    glipiZIDE (GLUCOTROL XL) 10 MG 24 hr tablet, Take 10 mg by mouth daily with breakfast., Disp: , Rfl:    lidocaine-prilocaine (EMLA)  cream, Apply to affected area once, Disp: 30 g, Rfl: 3   loratadine (CLARITIN) 10 MG tablet, Take 10 mg by mouth daily. , Disp: , Rfl:    Magnesium 500 MG TABS, Take 1,000 mg by mouth daily., Disp: , Rfl:    oxycodone (OXY-IR) 5 MG capsule, Take 1 capsule (5 mg total) by mouth every 6 (six) hours as needed., Disp: 60 capsule, Rfl: 0   oxyCODONE-acetaminophen (PERCOCET) 5-325 MG tablet, Take  1-2 tablets by mouth every 4 (four) hours as needed for moderate pain or severe pain., Disp: 20 tablet, Rfl: 0   rosuvastatin (CRESTOR) 10 MG tablet, Take 10 mg by mouth at bedtime., Disp: , Rfl:    telmisartan (MICARDIS) 80 MG tablet, Take 40 mg by mouth every evening. , Disp: , Rfl:    warfarin (COUMADIN) 5 MG tablet, Take 5 mg by mouth every evening. Take 1.5 tablets (7.5 mg) by mouth on Saturdays in the evening & take 1 tablet (5 mg) by mouth on Sundays, Mondays, Tuesdays, Wednesdays, Thursdays & Fridays., Disp: , Rfl:    enoxaparin (LOVENOX) 60 MG/0.6ML injection, enoxaparin 60 mg/0.6 mL subcutaneous syringe (Patient not taking: Reported on 05/03/2021), Disp: , Rfl:   Physical exam:  Vitals:   05/03/21 1423  BP: 116/79  Pulse: 73  Resp: 20  Temp: (!) 96.8 F (36 C)  SpO2: 97%  Weight: 227 lb 8 oz (103.2 kg)   Physical Exam Constitutional:      General: He is not in acute distress. Cardiovascular:     Rate and Rhythm: Normal rate and regular rhythm.     Heart sounds: Normal heart sounds.  Pulmonary:     Effort: Pulmonary effort is normal.     Breath sounds: Normal breath sounds.  Abdominal:     General: Bowel sounds are normal.     Palpations: Abdomen is soft.  Lymphadenopathy:     Comments: No palpable cervical, supraclavicular, axillary or inguinal adenopathy    Skin:    General: Skin is warm and dry.  Neurological:     Mental Status: He is alert and oriented to person, place, and time.     CMP Latest Ref Rng & Units 05/03/2021  Glucose 70 - 99 mg/dL 150(H)  BUN 8 - 23 mg/dL 25(H)   Creatinine 0.61 - 1.24 mg/dL 1.65(H)  Sodium 135 - 145 mmol/L 140  Potassium 3.5 - 5.1 mmol/L 4.3  Chloride 98 - 111 mmol/L 106  CO2 22 - 32 mmol/L 25  Calcium 8.9 - 10.3 mg/dL 9.2  Total Protein 6.5 - 8.1 g/dL 6.5  Total Bilirubin 0.3 - 1.2 mg/dL 0.4  Alkaline Phos 38 - 126 U/L 37(L)  AST 15 - 41 U/L 22  ALT 0 - 44 U/L 22   CBC Latest Ref Rng & Units 05/03/2021  WBC 4.0 - 10.5 K/uL 5.0  Hemoglobin 13.0 - 17.0 g/dL 14.3  Hematocrit 39.0 - 52.0 % 42.6  Platelets 150 - 400 K/uL 193    No images are attached to the encounter.  CT Abdomen Pelvis Wo Contrast  Result Date: 05/02/2021 CLINICAL DATA:  Non-Hodgkin's lymphoma, diffuse large B-cell lymphoma, surveillance EXAM: CT ABDOMEN AND PELVIS WITHOUT CONTRAST TECHNIQUE: Multidetector CT imaging of the abdomen and pelvis was performed following the standard protocol without IV contrast. Oral enteric contrast was administered. COMPARISON:  PET-CT, 10/18/2020 06/21/2020 FINDINGS: Lower chest: No acute abnormality. Hepatobiliary: No solid liver abnormality is seen. No gallstones, gallbladder wall thickening, or biliary dilatation. Pancreas: Unremarkable. No pancreatic ductal dilatation or surrounding inflammatory changes. Spleen: Normal in size without significant abnormality. Adrenals/Urinary Tract: Adrenal glands are unremarkable. Multiple bilateral benign fluid attenuation cysts. Kidneys are otherwise normal, without renal calculi, solid lesion, or hydronephrosis. Bladder is unremarkable. Stomach/Bowel: Stomach is within normal limits. Appendix appears normal. No evidence of bowel wall thickening, distention, or inflammatory changes. Vascular/Lymphatic: Aortic atherosclerosis. No enlarged abdominal or pelvic lymph nodes. Reproductive: Mild prostatomegaly. Other: Small, fat containing left inguinal hernia. No abdominopelvic  ascites. A previously noted soft tissue mass about the proximal right ureter remains almost completely resolved, with only a  small irregular remnant measuring no greater than 0.8 cm (series 2, image 49). Musculoskeletal: No acute or significant osseous findings. IMPRESSION: 1. A previously noted soft tissue mass about the proximal right ureter remains almost completely resolved, with only a small irregular remnant. There was no residual PET avidity on examination dated 10/18/2020. 2. No lymphadenopathy in the abdomen or pelvis. 3. Mild prostatomegaly. 4. Small, fat containing left inguinal hernia. Aortic Atherosclerosis (ICD10-I70.0). Electronically Signed   By: Eddie Candle M.D.   On: 05/02/2021 08:34     Assessment and plan- Patient is a 65 y.o. male with history of stage Ia diffuse large B-cell lymphoma s/p 3 cycles of R-CHOP chemotherapy followed by IMRT currently in CR 1 and this is a routine follow-up visit  Clinically patient is doing well with no concerning signs and symptoms of recurrence based on today's exam.  He did have a PET scan after completing 3 cycles of R-CHOP which showed reduction in the size of the mass from 3 cm to 7 mmWith no measurable metabolic activity [place] 1.  I did repeat his CT abdomen in 6 months following that to ensure no further increase in the size of the remnant mass which remains stable at 7 mm.  No other lymphadenopathy noted in the abdomen or pelvis.  He does not require any surveillance imaging moving forward.  I will see him back in 4 months with CBC with differential CMP and LDH.  Dizzy spells: He has had 2 such spells and he does report prodromal symptoms prior to onset of the symptoms.  Otherwise he is not complaining of any chest pain or exertional shortness of breath.  This does need further work-up and I have recommended that he should follow-up and discuss this further with Dr. Sabra Heck.  Patient verbalized understanding   Visit Diagnosis 1. Encounter for follow-up surveillance of diffuse large B-cell lymphoma      Dr. Randa Evens, MD, MPH [facility] [phone] 05/06/2021 8:42 AM

## 2021-06-19 ENCOUNTER — Other Ambulatory Visit: Payer: Self-pay

## 2021-06-19 ENCOUNTER — Encounter: Payer: Self-pay | Admitting: Radiation Oncology

## 2021-06-19 ENCOUNTER — Ambulatory Visit
Admission: RE | Admit: 2021-06-19 | Discharge: 2021-06-19 | Disposition: A | Discharge status: Home / Self Care | Payer: Commercial Managed Care - PPO | Source: Ambulatory Visit | Attending: Radiation Oncology | Admitting: Radiation Oncology

## 2021-06-19 ENCOUNTER — Other Ambulatory Visit: Payer: Self-pay | Admitting: *Deleted

## 2021-06-19 VITALS — BP 134/81 | HR 72 | Temp 94.5°F | Resp 18 | Wt 225.4 lb

## 2021-06-19 DIAGNOSIS — Z8572 Personal history of non-Hodgkin lymphomas: Secondary | ICD-10-CM | POA: Insufficient documentation

## 2021-06-19 DIAGNOSIS — Z08 Encounter for follow-up examination after completed treatment for malignant neoplasm: Secondary | ICD-10-CM

## 2021-06-19 DIAGNOSIS — R55 Syncope and collapse: Secondary | ICD-10-CM | POA: Diagnosis not present

## 2021-06-19 DIAGNOSIS — Z8579 Personal history of other malignant neoplasms of lymphoid, hematopoietic and related tissues: Secondary | ICD-10-CM

## 2021-06-19 DIAGNOSIS — C8339 Diffuse large B-cell lymphoma, extranodal and solid organ sites: Secondary | ICD-10-CM

## 2021-06-19 DIAGNOSIS — Z923 Personal history of irradiation: Secondary | ICD-10-CM | POA: Insufficient documentation

## 2021-06-19 DIAGNOSIS — C83398 Diffuse large b-cell lymphoma of other extranodal and solid organ sites: Secondary | ICD-10-CM

## 2021-06-19 MED ORDER — AZITHROMYCIN 250 MG PO TABS: ORAL_TABLET | ORAL | 0 refills | Status: DC

## 2021-06-19 NOTE — Progress Notes (Signed)
Radiation Oncology Follow up Note  Name: Eddie Ryan   Date:   06/19/2021 MRN:  [id number] DOB: Sep 27, 1955    This 65 y.o. male presents to the clinic today for 99-month follow-up status post involved field radiation therapy to his right ureteral region for stage Ia high-grade B-cell lymphoma status post R-CHOP.  REFERRING PROVIDER: Rusty Aus, MD  HPI: Patient is a 65 year old male now out 6 months having completed involved field radiation therapy to review region of the right ureteral junction for stage Ia high-grade B-cell lymphoma status post 3 cycles of R-CHOP.  Seen today in routine follow-up he is doing well he has some occasional's fainting spells and is seeing Dr. Sabra Heck in the next several weeks with an appointment to discuss that.Marland Kitchen  PET scan back in March showed a soft tissue mass along the proximal right ureter is decreased significantly in size and has no measurable metabolic activity.  There is also no evidence of new lymphoma.  Follow-up CT scan in September showed soft tissue mass in the proximal right ureter remains almost completely resolved.  No other lymphadenopathy in the abdomen pelvis is noted.  He is asymptomatic.  COMPLICATIONS OF TREATMENT: none  FOLLOW UP COMPLIANCE: keeps appointments   PHYSICAL EXAM:  BP 134/81 (BP Location: Left Arm, Patient Position: Sitting)   Pulse 72   Temp (!) 94.5 F (34.7 C) (Tympanic)   Resp 18   Wt 225 lb 6.4 oz (102.2 kg)   BMI 28.94 kg/m  Well-developed well-nourished patient in NAD. HEENT reveals PERLA, EOMI, discs not visualized.  Oral cavity is clear. No oral mucosal lesions are identified. Neck is clear without evidence of cervical or supraclavicular adenopathy. Lungs are clear to A&P. Cardiac examination is essentially unremarkable with regular rate and rhythm without murmur rub or thrill. Abdomen is benign with no organomegaly or masses noted. Motor sensory and DTR levels are equal and symmetric in the upper and lower  extremities. Cranial nerves II through XII are grossly intact. Proprioception is intact. No peripheral adenopathy or edema is identified. No motor or sensory levels are noted. Crude visual fields are within normal range.  RADIOLOGY RESULTS: CT scans and PET CT scans reviewed compatible with above-stated findings  PLAN: Present time patient is in in remission from his high-grade stage I B-cell lymphoma.  I will turn follow-up care over to medical oncology.  I be happy to reevaluate the patient in time should further radiation opinion be indicated.  Patient knows to call with any concerns.  I would like to take this opportunity to thank you for allowing me to participate in the care of your patient.Noreene Filbert, MD

## 2021-06-19 NOTE — Progress Notes (Signed)
Survivorship Care Plan visit completed.  Treatment summary reviewed and given to patient.  ASCO answers booklet reviewed and given to patient.  CARE program and Cancer Transitions discussed with patient along with other resources cancer center offers to patients and caregivers.  Patient verbalized understanding.    

## 2021-07-12 ENCOUNTER — Encounter: Payer: Self-pay | Admitting: Oncology

## 2021-07-12 NOTE — Telephone Encounter (Signed)
See previous note from 02/04/21, signing note

## 2021-07-16 NOTE — Progress Notes (Addendum)
07/17/21 1:20 PM   Eddie Ryan July 29, 1956 [id number]  Referring provider:  Rusty Aus, MD [facility] [place],  [place] [address] Chief Complaint  Patient presents with   Nephrolithiasis     HPI: Eddie Ryan is a 65 y.o.male with a personal history of B-cell lymphoma who presents today for follow-up with KUB prior.   He is s/p percutaneous biopsy for right proximal periureteral mass that revealed diffuse B-cell lymphoma.  Initially he presented with a large right renal pelvic stone also found to have a proximal ureteral mass.  Ultimately, after several diagnostic ureteroscopy's, this was able to be diagnosed by percutaneous biopsy as a B-cell lymphoma.  He recently underwent a CT abdomen and pelvis on 05/02/2021 is revealed that the previously noted soft tissue mass about the proximal right ureter remains almost completely resolved, with only a small irregular remnant. There was no residual PET avidity on examination dated 10/18/2020.  No recurrent stones on any of these cross-sectional images.  He is followed by Dr.Rao for his B-cell lymphoma. He underwent three cycles of CHOP followed by IMRT   KUB was reviewed personally today.   He is doing well today. He reports that he found out that his brothers had problems with erection. He is interested in IPP for his erectile dysfunction, he had previously failed medications.   PMH: Past Medical History:  Diagnosis Date   Anxiety    Collagen vascular disease ([place])    Depression    Diabetes mellitus without complication ([place])    DVT (deep venous thrombosis) ([place])    History of kidney stones    Hypertension    Lymphoma ([place]) 07/2020   Sleep apnea     Surgical History: Past Surgical History:  Procedure Laterality Date   CERVICAL FUSION     CYSTOSCOPY W/ RETROGRADES Left 04/26/2020   Procedure: CYSTOSCOPY WITH RETROGRADE PYELOGRAM;  Surgeon: Hollice Espy, MD;  Location: [facility]  ORS;  Service: Urology;  Laterality: Left;   CYSTOSCOPY WITH URETEROSCOPY AND STENT PLACEMENT Right 07/02/2020   Procedure: CYSTOSCOPY WITH URETEROSCOPY AND STENT PLACEMENT;  Surgeon: Hollice Espy, MD;  Location: [facility] ORS;  Service: Urology;  Laterality: Right;   CYSTOSCOPY/URETEROSCOPY/HOLMIUM LASER/STENT PLACEMENT Right 04/26/2020   Procedure: CYSTOSCOPY/URETEROSCOPY/HOLMIUM LASER/STENT PLACEMENT;  Surgeon: Hollice Espy, MD;  Location: [facility] ORS;  Service: Urology;  Laterality: Right;   IR IMAGING GUIDED PORT INSERTION  07/27/2020   IR REMOVAL TUN ACCESS W/ PORT W/O FL MOD SED  02/08/2021   PLANTAR FASCIA SURGERY Bilateral    ROTATOR CUFF REPAIR Left    THUMB FUSION     URETERAL BIOPSY Right 04/26/2020   Procedure: URETERAL BIOPSY with ablation;  Surgeon: Hollice Espy, MD;  Location: [facility] ORS;  Service: Urology;  Laterality: Right;   URETEROSCOPY WITH HOLMIUM LASER LITHOTRIPSY      Home Medications:  Allergies as of 07/17/2021       Reactions   Losartan Potassium-hctz Other (See Comments)   Unknown reaction.   Morphine And Related Itching   WITH PROLONGED USE        Medication List        Accurate as of July 17, 2021  1:20 PM. If you have any questions, ask your nurse or doctor.          STOP taking these medications    azithromycin 250 MG tablet Commonly known as: Zithromax Z-Pak Stopped by: Hollice Espy, MD   oxycodone 5 MG capsule Commonly known  as: OXY-IR Stopped by: Hollice Espy, MD   VITAMIN B-12 PO Stopped by: Hollice Espy, MD       TAKE these medications    ALPRAZolam 0.5 MG tablet Commonly known as: XANAX Take 0.5 mg by mouth in the morning and at bedtime.   buPROPion 150 MG 24 hr tablet Commonly known as: WELLBUTRIN XL Take 300 mg by mouth daily.   diclofenac 50 MG EC tablet Commonly known as: VOLTAREN Take 50 mg by mouth daily as needed.   diclofenac Sodium 1 % Gel Commonly known as: VOLTAREN Apply 1 g topically 4 (four)  times daily as needed (thumb as needed for pain.).   docusate sodium 100 MG capsule Commonly known as: COLACE Take 200 mg by mouth daily.   enoxaparin 60 MG/0.6ML injection Commonly known as: LOVENOX enoxaparin 60 mg/0.6 mL subcutaneous syringe   ergocalciferol 1.25 MG (50000 UT) capsule Commonly known as: VITAMIN D2 Take 50,000 Units by mouth every Monday.   escitalopram 20 MG tablet Commonly known as: LEXAPRO Take 20 mg by mouth daily.   etodolac 400 MG tablet Commonly known as: LODINE Take 400 mg by mouth daily with breakfast.   glipiZIDE 10 MG 24 hr tablet Commonly known as: GLUCOTROL XL Take 10 mg by mouth daily with breakfast.   lidocaine-prilocaine cream Commonly known as: EMLA Apply to affected area once   loratadine 10 MG tablet Commonly known as: CLARITIN Take 10 mg by mouth daily.   Magnesium 500 MG Tabs Take 1,000 mg by mouth daily.   oxyCODONE-acetaminophen 5-325 MG tablet Commonly known as: Percocet Take 1-2 tablets by mouth every 4 (four) hours as needed for moderate pain or severe pain.   rosuvastatin 10 MG tablet Commonly known as: CRESTOR Take 10 mg by mouth at bedtime.   telmisartan 80 MG tablet Commonly known as: MICARDIS Take 40 mg by mouth every evening.   warfarin 5 MG tablet Commonly known as: COUMADIN Take 5 mg by mouth every evening. Take 1.5 tablets (7.5 mg) by mouth on Saturdays in the evening & take 1 tablet (5 mg) by mouth on Sundays, Mondays, Tuesdays, Wednesdays, Thursdays & Fridays.        Allergies:  Allergies  Allergen Reactions   Losartan Potassium-Hctz Other (See Comments)    Unknown reaction.   Morphine And Related Itching    WITH PROLONGED USE    Family History: Family History  Problem Relation Age of Onset   Dementia Mother    Pancreatic cancer Father    Prostate cancer Paternal Grandmother    Leukemia Nephew     Social History:  reports that he quit smoking about 41 years ago. His smoking use included  cigarettes. He has never used smokeless tobacco. He reports that he does not drink alcohol and does not use drugs.   Physical Exam: BP 116/76   Pulse 73   Ht 6\' 2"  (1.88 m)   Wt 218 lb (98.9 kg)   BMI 27.99 kg/m   Constitutional:  Alert and oriented, No acute distress. HEENT: [place] AT, moist mucus membranes.  Trachea midline, no masses. Cardiovascular: No clubbing, cyanosis, or edema. Respiratory: Normal respiratory effort, no increased work of breathing. Skin: No rashes, bruises or suspicious lesions. Neurologic: Grossly intact, no focal deficits, moving all 4 extremities. Psychiatric: Normal mood and affect.  Laboratory Data: Lab Results  Component Value Date   CREATININE 1.65 (H) 05/03/2021     Pertinent Imaging: CLINICAL DATA:  Non-Hodgkin's lymphoma, diffuse large B-cell lymphoma, surveillance  EXAM: CT ABDOMEN AND PELVIS WITHOUT CONTRAST   TECHNIQUE: Multidetector CT imaging of the abdomen and pelvis was performed following the standard protocol without IV contrast. Oral enteric contrast was administered.   COMPARISON:  PET-CT, 10/18/2020 06/21/2020   FINDINGS: Lower chest: No acute abnormality.   Hepatobiliary: No solid liver abnormality is seen. No gallstones, gallbladder wall thickening, or biliary dilatation.   Pancreas: Unremarkable. No pancreatic ductal dilatation or surrounding inflammatory changes.   Spleen: Normal in size without significant abnormality.   Adrenals/Urinary Tract: Adrenal glands are unremarkable. Multiple bilateral benign fluid attenuation cysts. Kidneys are otherwise normal, without renal calculi, solid lesion, or hydronephrosis. Bladder is unremarkable.   Stomach/Bowel: Stomach is within normal limits. Appendix appears normal. No evidence of bowel wall thickening, distention, or inflammatory changes.   Vascular/Lymphatic: Aortic atherosclerosis. No enlarged abdominal or pelvic lymph nodes.   Reproductive: Mild  prostatomegaly.   Other: Small, fat containing left inguinal hernia. No abdominopelvic ascites. A previously noted soft tissue mass about the proximal right ureter remains almost completely resolved, with only a small irregular remnant measuring no greater than 0.8 cm (series 2, image 49).   Musculoskeletal: No acute or significant osseous findings.   IMPRESSION: 1. A previously noted soft tissue mass about the proximal right ureter remains almost completely resolved, with only a small irregular remnant. There was no residual PET avidity on examination dated 10/18/2020. 2. No lymphadenopathy in the abdomen or pelvis. 3. Mild prostatomegaly. 4. Small, fat containing left inguinal hernia.   Aortic Atherosclerosis (ICD10-I70.0).     Electronically Signed   By: Eddie Candle M.D.   On: 05/02/2021 08:34  KUB today as well as the above CT scan was personally reviewed.  Agree with radiologic interpretation.  He has no residual recurrent stones.   Assessment & Plan:    1.  B-cell lymphoma - Managed with Dr.Rao   2. Right renal stone  - S/p ureteroscopy  - Resolved  -No evidence of recurrent stone disease  3.  Erectile dysfunction  - We discussed the pathophysiology of erectile dysfunction today along with possible contributing factors. Discussed possible treatment options including PDE 5 inhibitors, vacuum erectile device, intracavernosal injection, MUSE, and placement of the inflatable or malleable penile prosthesis for refractory cases. He is interested inflated penile prostheses.  He was shown a model today we discussed risk benefits of this in detail.  - Referred to [facility] for further discussion on penile prosthesis.  Recommend high-volume implanter for best outcome.   Conley Rolls as a Education administrator for Hollice Espy, MD.,have documented all relevant documentation on the behalf of Hollice Espy, MD,as directed by  Hollice Espy, MD while in the presence of Hollice Espy,  MD.'  I have reviewed the above documentation for accuracy and completeness, and I agree with the above.   Hollice Espy, MD   [facility] [address], [place], [place] [address] [phone]  I spent 31 total minutes on the day of the encounter including pre-visit review of the medical record, face-to-face time with the patient, and post visit ordering of labs/imaging/tests.  Greater than 20 min was spent in the room with the patient himself discussing the above.

## 2021-07-17 ENCOUNTER — Ambulatory Visit
Admission: RE | Admit: 2021-07-17 | Discharge: 2021-07-17 | Disposition: A | Discharge status: Home / Self Care | Payer: Commercial Managed Care - PPO | Source: Ambulatory Visit | Attending: Urology | Admitting: Urology

## 2021-07-17 ENCOUNTER — Other Ambulatory Visit: Payer: Self-pay

## 2021-07-17 ENCOUNTER — Ambulatory Visit: Payer: Commercial Managed Care - PPO | Admitting: Urology

## 2021-07-17 VITALS — BP 116/76 | HR 73 | Ht 74.0 in | Wt 218.0 lb

## 2021-07-17 DIAGNOSIS — N529 Male erectile dysfunction, unspecified: Secondary | ICD-10-CM | POA: Diagnosis not present

## 2021-07-17 DIAGNOSIS — N200 Calculus of kidney: Secondary | ICD-10-CM | POA: Insufficient documentation

## 2021-08-08 DIAGNOSIS — G5793 Unspecified mononeuropathy of bilateral lower limbs: Secondary | ICD-10-CM | POA: Insufficient documentation

## 2021-09-03 ENCOUNTER — Other Ambulatory Visit: Payer: Self-pay

## 2021-09-03 ENCOUNTER — Encounter: Payer: Self-pay | Admitting: Oncology

## 2021-09-03 ENCOUNTER — Inpatient Hospital Stay: Payer: Commercial Managed Care - PPO | Attending: Oncology | Admitting: Oncology

## 2021-09-03 ENCOUNTER — Inpatient Hospital Stay: Payer: Commercial Managed Care - PPO

## 2021-09-03 VITALS — BP 135/83 | HR 66 | Temp 97.0°F | Resp 16 | Ht 74.0 in | Wt 229.0 lb

## 2021-09-03 DIAGNOSIS — C8330 Diffuse large B-cell lymphoma, unspecified site: Secondary | ICD-10-CM | POA: Diagnosis present

## 2021-09-03 DIAGNOSIS — Z86718 Personal history of other venous thrombosis and embolism: Secondary | ICD-10-CM | POA: Insufficient documentation

## 2021-09-03 DIAGNOSIS — Z800 Family history of malignant neoplasm of digestive organs: Secondary | ICD-10-CM | POA: Insufficient documentation

## 2021-09-03 DIAGNOSIS — Z885 Allergy status to narcotic agent status: Secondary | ICD-10-CM | POA: Insufficient documentation

## 2021-09-03 DIAGNOSIS — N200 Calculus of kidney: Secondary | ICD-10-CM | POA: Insufficient documentation

## 2021-09-03 DIAGNOSIS — Z87891 Personal history of nicotine dependence: Secondary | ICD-10-CM | POA: Insufficient documentation

## 2021-09-03 DIAGNOSIS — D6862 Lupus anticoagulant syndrome: Secondary | ICD-10-CM | POA: Diagnosis not present

## 2021-09-03 DIAGNOSIS — Z8042 Family history of malignant neoplasm of prostate: Secondary | ICD-10-CM | POA: Diagnosis not present

## 2021-09-03 DIAGNOSIS — Z79899 Other long term (current) drug therapy: Secondary | ICD-10-CM | POA: Insufficient documentation

## 2021-09-03 DIAGNOSIS — Z87442 Personal history of urinary calculi: Secondary | ICD-10-CM | POA: Insufficient documentation

## 2021-09-03 DIAGNOSIS — Z7901 Long term (current) use of anticoagulants: Secondary | ICD-10-CM | POA: Insufficient documentation

## 2021-09-03 DIAGNOSIS — Z08 Encounter for follow-up examination after completed treatment for malignant neoplasm: Secondary | ICD-10-CM | POA: Diagnosis not present

## 2021-09-03 DIAGNOSIS — R5383 Other fatigue: Secondary | ICD-10-CM | POA: Insufficient documentation

## 2021-09-03 DIAGNOSIS — N529 Male erectile dysfunction, unspecified: Secondary | ICD-10-CM | POA: Diagnosis not present

## 2021-09-03 DIAGNOSIS — Z806 Family history of leukemia: Secondary | ICD-10-CM | POA: Insufficient documentation

## 2021-09-03 DIAGNOSIS — Z9221 Personal history of antineoplastic chemotherapy: Secondary | ICD-10-CM | POA: Diagnosis not present

## 2021-09-03 DIAGNOSIS — Z8579 Personal history of other malignant neoplasms of lymphoid, hematopoietic and related tissues: Secondary | ICD-10-CM

## 2021-09-03 DIAGNOSIS — Z818 Family history of other mental and behavioral disorders: Secondary | ICD-10-CM | POA: Insufficient documentation

## 2021-09-03 DIAGNOSIS — G629 Polyneuropathy, unspecified: Secondary | ICD-10-CM | POA: Diagnosis not present

## 2021-09-03 LAB — CBC WITH DIFFERENTIAL/PLATELET
Abs Immature Granulocytes: 0.01 10*3/uL (ref 0.00–0.07)
Basophils Absolute: 0 10*3/uL (ref 0.0–0.1)
Basophils Relative: 1 %
Eosinophils Absolute: 0.1 10*3/uL (ref 0.0–0.5)
Eosinophils Relative: 1 %
HCT: 47.3 % (ref 39.0–52.0)
Hemoglobin: 15.5 g/dL (ref 13.0–17.0)
Immature Granulocytes: 0 %
Lymphocytes Relative: 21 %
Lymphs Abs: 1.2 10*3/uL (ref 0.7–4.0)
MCH: 29.2 pg (ref 26.0–34.0)
MCHC: 32.8 g/dL (ref 30.0–36.0)
MCV: 89.1 fL (ref 80.0–100.0)
Monocytes Absolute: 0.6 10*3/uL (ref 0.1–1.0)
Monocytes Relative: 10 %
Neutro Abs: 3.9 10*3/uL (ref 1.7–7.7)
Neutrophils Relative %: 67 %
Platelets: 203 10*3/uL (ref 150–400)
RBC: 5.31 MIL/uL (ref 4.22–5.81)
RDW: 13.5 % (ref 11.5–15.5)
WBC: 5.8 10*3/uL (ref 4.0–10.5)
nRBC: 0 % (ref 0.0–0.2)

## 2021-09-03 LAB — COMPREHENSIVE METABOLIC PANEL
ALT: 22 U/L (ref 0–44)
AST: 19 U/L (ref 15–41)
Albumin: 4.3 g/dL (ref 3.5–5.0)
Alkaline Phosphatase: 42 U/L (ref 38–126)
Anion gap: 9 (ref 5–15)
BUN: 20 mg/dL (ref 8–23)
CO2: 28 mmol/L (ref 22–32)
Calcium: 9.5 mg/dL (ref 8.9–10.3)
Chloride: 101 mmol/L (ref 98–111)
Creatinine, Ser: 1.52 mg/dL — ABNORMAL HIGH (ref 0.61–1.24)
GFR, Estimated: 51 mL/min — ABNORMAL LOW (ref >60)
Glucose, Bld: 162 mg/dL — ABNORMAL HIGH (ref 70–99)
Potassium: 4.8 mmol/L (ref 3.5–5.1)
Sodium: 138 mmol/L (ref 135–145)
Total Bilirubin: 0.3 mg/dL (ref 0.3–1.2)
Total Protein: 6.9 g/dL (ref 6.5–8.1)

## 2021-09-03 LAB — LACTATE DEHYDROGENASE: LDH: 129 U/L (ref 98–192)

## 2021-09-03 NOTE — Progress Notes (Signed)
Hematology/Oncology Consult note [facility]  [phone] [phone]  Patient Care Team: Rusty Aus, MD as PCP - General (Internal Medicine) Sindy Guadeloupe, MD as Consulting Physician (Oncology) Noreene Filbert, MD as Consulting Physician (Radiation Oncology)   Name of the patient: Eddie Ryan  [id number]  [birth year]   Date of visit: 09/03/21  Diagnosis- stage I E high-grade B-cell lymphoma currently in CR 1  Chief complaint/ Reason for visit-routine follow-up of DLBCL  Heme/Onc history: Patient is a 66 year old male with a prior history of DVT about 15 years ago and was diagnosed with antiphospholipid antibody syndrome with a positive lupus anticoagulant.  He has remained on Coumadin since then.  He has been seeing Dr. Erlene Quan for kidney stones.  He had a CT abdomen and pelvis in August 2021 which showed thickening of the urothelium concerning for a urothelial lesion.  He underwent a cystoscopy and ureteroscopy in September 2021 which did not show any intraluminal pathology.  Plan was to get a repeat CT urogram 6 weeks following that.  He had a CT hematuria work-up again in November 2021 which showed enhancing soft tissue lesion in the proximal right ureter measuring 2.3 x 2.3 x 3.5 cm which was extending for 2.6 cm and enlarges compared to prior size of 1.5 x 1.1 x 2.9 cm.  There is no evidence of retroperitoneal adenopathy.  Left external iliac node was 1.2 cm and was unchanged as compared to prior CT scan.  He underwent a repeat ureteroscopy with stent placement.  There was no obvious filling defect but there was a slight narrowing of ureter and patient therefore underwent a CT-guided biopsy by IR   Biopsy showed B-cell lymphoma High-grade with a Ki-67 of greater than 90%. Comment:  Core biopsy sections display lymphoid tissue. There are aggregates  comprised of larger lymphocytes, interspersed with areas of small mature  lymphocytes, and  focal background bands of fibrosis. Typical follicular  architecture is not present.   Immunohistochemical studies show strong and diffuse positivity for CD20,  indicating a B cell proliferation, comprised of both smaller and larger  lymphocytes. CD3 marks scattered background T cells. The subset of  larger B cells shows positivity for CD10, BCL6, and MUM-1 (partial).  These cells are negative for BCL2, Cyclin-D1, and CD30. BCL2 appears to  show marking in the subset of smaller lymphocytes, as well as normal T  cells. Ki-67 is significantly elevated in the subset of larger B cells,  estimated at greater than 90% staining. C-myc shows dim staining in a  portion of the larger B cells, approximately 20-30%. Pancytokeratin and  CD56 are negative.   Flow cytometric studies detect an abnormal CD10+ B cell population in a  background of polytypic B cells. These CD10+ B cells represent 28% of B  cells and show very dim kappa light chain restriction, to negative light  chain expression. By light scatter, these B cells appear approximately  the same size as background T cells. There is no loss of, or aberrant  expression of, pan T-cell antigens to suggest a neoplastic T cell  process. Cell viability in this sample is sufficient for analysis, but  less than optimal.   Overall classification of this process is limited due to the small size  of core biopsy samples. The histologic and immunophenotypic findings are  compatible with involvement by a B cell lymphoma. CD10 expression, as  seen by immunohistochemistry and flow cytometry, may imply  germinal/follicular center  origin of these B cells. Absence of staining  for BCL2 could suggest against follicular lymphoma, although BCL2  expression may be absent in 25-50% of cases of grade 3 follicular  lymphoma. The differential diagnosis may also include diffuse large B  cell lymphoma, given the increased proportion of larger abnormal B  cells. Further  subclassification of this process would be dependent upon  excisional biopsy or review of overall lesional architecture. There is  sufficient tissue for ancillary FISH or NGS testing, if desired.   Bone marrow biopsy was negative for lymphoma.  PET/CT scan showed:A 3.3 x 2.7 cm soft tissue lesion around the right proximal ureter with an SUV of 13.4.  There was no other hypermetabolic abdominopelvic adenopathy noted.  No hypermetabolism noted in the liver or spleen or other organs.  No intrathoracic adenopathy.  No hypermetabolic activity noted in the bones.      Interval history-patient reports that after adjusting his blood pressure medicines he has not had any dizzy spells anymore.  He does report some numbness especially in his bilateral for which she is on gabapentin 300 mg at night but does not think that it helps him particularly.  He is also following up with urology for erectile dysfunction.  ECOG PS- 1 Pain scale- 0   Review of systems- Review of Systems  Constitutional:  Positive for malaise/fatigue. Negative for chills, fever and weight loss.  HENT:  Negative for congestion, ear discharge and nosebleeds.   Eyes:  Negative for blurred vision.  Respiratory:  Negative for cough, hemoptysis, sputum production, shortness of breath and wheezing.   Cardiovascular:  Negative for chest pain, palpitations, orthopnea and claudication.  Gastrointestinal:  Negative for abdominal pain, blood in stool, constipation, diarrhea, heartburn, melena, nausea and vomiting.  Genitourinary:  Negative for dysuria, flank pain, frequency, hematuria and urgency.  Musculoskeletal:  Negative for back pain, joint pain and myalgias.  Skin:  Negative for rash.  Neurological:  Positive for sensory change (Peripheral neuropathy). Negative for dizziness, tingling, focal weakness, seizures, weakness and headaches.  Endo/Heme/Allergies:  Does not bruise/bleed easily.  Psychiatric/Behavioral:  Negative for depression  and suicidal ideas. The patient does not have insomnia.    Allergies  Allergen Reactions   Losartan Potassium-Hctz Other (See Comments)    Unknown reaction.   Morphine And Related Itching    WITH PROLONGED USE     Past Medical History:  Diagnosis Date   Anxiety    Collagen vascular disease ([place])    Depression    Diabetes mellitus without complication ([place])    DVT (deep venous thrombosis) ([place])    History of kidney stones    Hypertension    Lymphoma ([place]) 07/2020   Sleep apnea      Past Surgical History:  Procedure Laterality Date   CERVICAL FUSION     CYSTOSCOPY W/ RETROGRADES Left 04/26/2020   Procedure: CYSTOSCOPY WITH RETROGRADE PYELOGRAM;  Surgeon: Hollice Espy, MD;  Location: [facility] OR[facility];  Service: Urology;  Laterality: Left;   CYSTOSCOPY WITH URETEROSCOPY AND STENT PLACEMENT Right 07/02/2020   Procedure: CYSTOSCOPY WITH URETEROSCOPY AND STENT PLACEMENT;  Surgeon: Hollice Espy, MD;  Location: [facility];  Service: Urology;  Laterality: Right;   CYSTOSCOPY/URETEROSCOPY/HOLMIUM LASER/STENT PLACEMENT Right 04/26/2020   Procedure: CYSTOSCOPY/URETEROSCOPY/HOLMIUM LASER/STENT PLACEMENT;  Surgeon: Hollice Espy, MD;  Location: [facility];  Service: Urology;  Laterality: Right;   IR IMAGING GUIDED PORT INSERTION  07/27/2020   IR REMOVAL TUN ACCESS W/ PORT W/O FL MOD SED  02/08/2021   PLANTAR  FASCIA SURGERY Bilateral    ROTATOR CUFF REPAIR Left    THUMB FUSION     URETERAL BIOPSY Right 04/26/2020   Procedure: URETERAL BIOPSY with ablation;  Surgeon: Hollice Espy, MD;  Location: [facility];  Service: Urology;  Laterality: Right;   URETEROSCOPY WITH HOLMIUM LASER LITHOTRIPSY      Social History   Socioeconomic History   Marital status: Single    Spouse name: Not on file   Number of children: 0   Years of education: Not on file   Highest education level: Not on file  Occupational History    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   Social Determinants of Health   Financial Resource Strain: Not on file  Food Insecurity: Not on file  Transportation Needs: Not on file  Physical Activity: Not on file  Stress: Not on file  Social Connections: Not on file  Intimate Partner Violence: Not on file    Family History  Problem Relation Age of Onset   Dementia Mother    Pancreatic cancer Father    Prostate cancer Paternal Grandmother    Leukemia Nephew      Current Outpatient Medications:    ALPRAZolam (XANAX) 0.5 MG tablet, Take 0.5 mg by mouth in the morning and at bedtime. , Disp: , Rfl:    buPROPion (WELLBUTRIN XL) 150 MG 24 hr tablet, Take 300 mg by mouth daily. , Disp: , Rfl:    diclofenac (VOLTAREN) 50 MG EC tablet, Take 50 mg by mouth daily as needed., Disp: , Rfl:    diclofenac Sodium (VOLTAREN) 1 % GEL, Apply 1 g topically 4 (four) times daily as needed (thumb as needed for pain.). , Disp: , Rfl:    docusate sodium (COLACE) 100 MG capsule, Take 200 mg by mouth daily., Disp: , Rfl:    enoxaparin (LOVENOX) 60 MG/0.6ML injection, enoxaparin 60 mg/0.6 mL subcutaneous syringe, Disp: , Rfl:    ergocalciferol (VITAMIN D2) 1.25 MG (50000 UT) capsule, Take 50,000 Units by mouth every Monday. , Disp: , Rfl:    escitalopram (LEXAPRO) 20 MG tablet, Take 20 mg by mouth daily. , Disp: , Rfl:    gabapentin (NEURONTIN) 300 MG capsule, Take 1 capsule by mouth at bedtime., Disp: , Rfl:    glipiZIDE (GLUCOTROL XL) 10 MG 24 hr tablet, Take 10 mg by mouth daily with breakfast., Disp: , Rfl:    lidocaine-prilocaine (EMLA) cream, Apply to affected area once, Disp: 30 g, Rfl: 3   loratadine  (CLARITIN) 10 MG tablet, Take 10 mg by mouth daily. , Disp: , Rfl:    Magnesium 500 MG TABS, Take 1,000 mg by mouth daily., Disp: , Rfl:    oxyCODONE-acetaminophen (PERCOCET) 5-325 MG tablet, Take 1-2 tablets by mouth every 4 (four) hours as needed for moderate pain or severe pain., Disp: 20 tablet, Rfl: 0   rosuvastatin (CRESTOR) 10 MG tablet, Take 10 mg by mouth at bedtime., Disp: , Rfl:    telmisartan (MICARDIS) 80 MG tablet, Take 40 mg by mouth every  evening. , Disp: , Rfl:    warfarin (COUMADIN) 5 MG tablet, Take 5 mg by mouth every evening. Take 1.5 tablets (7.5 mg) by mouth on Saturdays in the evening & take 1 tablet (5 mg) by mouth on Sundays, Mondays, Tuesdays, Wednesdays, Thursdays & Fridays., Disp: , Rfl:   Physical exam:  Vitals:   09/03/21 0953  BP: 135/83  Pulse: 66  Resp: 16  Temp: (!) 97 F (36.1 C)  TempSrc: Tympanic  SpO2: 96%  Weight: 229 lb (103.9 kg)  Height: 6' 2"  (1.88 m)   Physical Exam Constitutional:      General: He is not in acute distress. Cardiovascular:     Rate and Rhythm: Normal rate and regular rhythm.     Heart sounds: Normal heart sounds.  Pulmonary:     Effort: Pulmonary effort is normal.     Breath sounds: Normal breath sounds.  Abdominal:     General: Bowel sounds are normal.     Palpations: Abdomen is soft.  Lymphadenopathy:     Comments: No palpable cervical, supraclavicular, axillary or inguinal adenopathy    Skin:    General: Skin is warm and dry.  Neurological:     Mental Status: He is alert and oriented to person, place, and time.     CMP Latest Ref Rng & Units 09/03/2021  Glucose 70 - 99 mg/dL 162(H)  BUN 8 - 23 mg/dL 20  Creatinine 0.61 - 1.24 mg/dL 1.52(H)  Sodium 135 - 145 mmol/L 138  Potassium 3.5 - 5.1 mmol/L 4.8  Chloride 98 - 111 mmol/L 101  CO2 22 - 32 mmol/L 28  Calcium 8.9 - 10.3 mg/dL 9.5  Total Protein 6.5 - 8.1 g/dL 6.9  Total Bilirubin 0.3 - 1.2 mg/dL 0.3  Alkaline Phos 38 - 126 U/L 42  AST 15 - 41 U/L 19   ALT 0 - 44 U/L 22   CBC Latest Ref Rng & Units 09/03/2021  WBC 4.0 - 10.5 K/uL 5.8  Hemoglobin 13.0 - 17.0 g/dL 15.5  Hematocrit 39.0 - 52.0 % 47.3  Platelets 150 - 400 K/uL 203     Assessment and plan- Patient is a 66 y.o. male with history of stage Ia diffuse large B-cell lymphoma s/p 3 cycles of R-CHOP chemotherapy followed by IMRT currently in CR 1.  This is a routine follow-up visit  Clinically patient is doing well with no concerning signs and symptoms of recurrence based on today's exam.  He is roughly 1 year since he has completed chemotherapy.  No indication for surveillance imaging at this time.  I will see him back in 4 months with CBC with differential CMP and LDH.  Peripheral neuropathy: He had some of the symptoms even prior to starting chemotherapy.  Slightly worsened since chemotherapy.  He states that gabapentin 300 mg at night has not been doing a whole lot for him.  Mainly reports numbness than pain or discomfort from his neuropathy.  Explained to the patient gabapentin usually does not eliminate numbness. It would be ok to increase dose to 300 mg TID and see if it helps any. If it doesn't make a difference to his symptoms and his symptoms remain overall manegable, it would be ok to observe him off gabapentin as well     Visit Diagnosis 1. Encounter for follow-up surveillance of diffuse large B-cell lymphoma      Dr. Randa Evens, MD, MPH [facility] at [facility] [phone] 09/03/2021 12:54 PM

## 2022-01-01 ENCOUNTER — Inpatient Hospital Stay: Payer: Commercial Managed Care - PPO | Attending: Oncology

## 2022-01-01 ENCOUNTER — Inpatient Hospital Stay (HOSPITAL_BASED_OUTPATIENT_CLINIC_OR_DEPARTMENT_OTHER): Payer: Commercial Managed Care - PPO | Admitting: Oncology

## 2022-01-01 ENCOUNTER — Encounter: Payer: Self-pay | Admitting: Oncology

## 2022-01-01 VITALS — BP 117/84 | HR 66 | Temp 97.4°F | Resp 20 | Wt 224.9 lb

## 2022-01-01 DIAGNOSIS — Z7901 Long term (current) use of anticoagulants: Secondary | ICD-10-CM | POA: Insufficient documentation

## 2022-01-01 DIAGNOSIS — M542 Cervicalgia: Secondary | ICD-10-CM | POA: Insufficient documentation

## 2022-01-01 DIAGNOSIS — Z800 Family history of malignant neoplasm of digestive organs: Secondary | ICD-10-CM | POA: Diagnosis not present

## 2022-01-01 DIAGNOSIS — Z806 Family history of leukemia: Secondary | ICD-10-CM | POA: Insufficient documentation

## 2022-01-01 DIAGNOSIS — C8330 Diffuse large B-cell lymphoma, unspecified site: Secondary | ICD-10-CM | POA: Insufficient documentation

## 2022-01-01 DIAGNOSIS — D6862 Lupus anticoagulant syndrome: Secondary | ICD-10-CM | POA: Insufficient documentation

## 2022-01-01 DIAGNOSIS — N200 Calculus of kidney: Secondary | ICD-10-CM | POA: Insufficient documentation

## 2022-01-01 DIAGNOSIS — Z79899 Other long term (current) drug therapy: Secondary | ICD-10-CM | POA: Diagnosis not present

## 2022-01-01 DIAGNOSIS — Z818 Family history of other mental and behavioral disorders: Secondary | ICD-10-CM | POA: Insufficient documentation

## 2022-01-01 DIAGNOSIS — Z08 Encounter for follow-up examination after completed treatment for malignant neoplasm: Secondary | ICD-10-CM | POA: Diagnosis not present

## 2022-01-01 DIAGNOSIS — I10 Essential (primary) hypertension: Secondary | ICD-10-CM | POA: Insufficient documentation

## 2022-01-01 DIAGNOSIS — N529 Male erectile dysfunction, unspecified: Secondary | ICD-10-CM | POA: Diagnosis not present

## 2022-01-01 DIAGNOSIS — Z885 Allergy status to narcotic agent status: Secondary | ICD-10-CM | POA: Diagnosis not present

## 2022-01-01 DIAGNOSIS — Z87442 Personal history of urinary calculi: Secondary | ICD-10-CM | POA: Diagnosis not present

## 2022-01-01 DIAGNOSIS — Z86718 Personal history of other venous thrombosis and embolism: Secondary | ICD-10-CM | POA: Diagnosis not present

## 2022-01-01 DIAGNOSIS — Z8042 Family history of malignant neoplasm of prostate: Secondary | ICD-10-CM | POA: Insufficient documentation

## 2022-01-01 DIAGNOSIS — E785 Hyperlipidemia, unspecified: Secondary | ICD-10-CM | POA: Diagnosis not present

## 2022-01-01 DIAGNOSIS — Z8579 Personal history of other malignant neoplasms of lymphoid, hematopoietic and related tissues: Secondary | ICD-10-CM | POA: Diagnosis not present

## 2022-01-01 DIAGNOSIS — Z87891 Personal history of nicotine dependence: Secondary | ICD-10-CM | POA: Diagnosis not present

## 2022-01-01 LAB — CBC WITH DIFFERENTIAL/PLATELET
Abs Immature Granulocytes: 0.01 10*3/uL (ref 0.00–0.07)
Basophils Absolute: 0 10*3/uL (ref 0.0–0.1)
Basophils Relative: 1 %
Eosinophils Absolute: 0.1 10*3/uL (ref 0.0–0.5)
Eosinophils Relative: 2 %
HCT: 47.6 % (ref 39.0–52.0)
Hemoglobin: 15.6 g/dL (ref 13.0–17.0)
Immature Granulocytes: 0 %
Lymphocytes Relative: 25 %
Lymphs Abs: 1.8 10*3/uL (ref 0.7–4.0)
MCH: 28.7 pg (ref 26.0–34.0)
MCHC: 32.8 g/dL (ref 30.0–36.0)
MCV: 87.5 fL (ref 80.0–100.0)
Monocytes Absolute: 0.8 10*3/uL (ref 0.1–1.0)
Monocytes Relative: 11 %
Neutro Abs: 4.6 10*3/uL (ref 1.7–7.7)
Neutrophils Relative %: 61 %
Platelets: 220 10*3/uL (ref 150–400)
RBC: 5.44 MIL/uL (ref 4.22–5.81)
RDW: 14.4 % (ref 11.5–15.5)
WBC: 7.4 10*3/uL (ref 4.0–10.5)
nRBC: 0 % (ref 0.0–0.2)

## 2022-01-01 LAB — COMPREHENSIVE METABOLIC PANEL
ALT: 23 U/L (ref 0–44)
AST: 19 U/L (ref 15–41)
Albumin: 4.2 g/dL (ref 3.5–5.0)
Alkaline Phosphatase: 39 U/L (ref 38–126)
Anion gap: 6 (ref 5–15)
BUN: 22 mg/dL (ref 8–23)
CO2: 29 mmol/L (ref 22–32)
Calcium: 9.1 mg/dL (ref 8.9–10.3)
Chloride: 102 mmol/L (ref 98–111)
Creatinine, Ser: 1.62 mg/dL — ABNORMAL HIGH (ref 0.61–1.24)
GFR, Estimated: 47 mL/min — ABNORMAL LOW (ref >60)
Glucose, Bld: 113 mg/dL — ABNORMAL HIGH (ref 70–99)
Potassium: 4.8 mmol/L (ref 3.5–5.1)
Sodium: 137 mmol/L (ref 135–145)
Total Bilirubin: 0.6 mg/dL (ref 0.3–1.2)
Total Protein: 7.1 g/dL (ref 6.5–8.1)

## 2022-01-01 LAB — LACTATE DEHYDROGENASE: LDH: 121 U/L (ref 98–192)

## 2022-01-01 NOTE — Progress Notes (Signed)
Hematology/Oncology Consult note [facility]  Telephone:([phone] [phone]  Patient Care Team: Rusty Aus, MD as PCP - General (Internal Medicine) Sindy Guadeloupe, MD as Consulting Physician (Oncology) Noreene Filbert, MD as Consulting Physician (Radiation Oncology)   Name of the patient: Eddie Ryan  [id number]  Apr 22, 1956   Date of visit: 01/01/22  Diagnosis- stage I E high-grade B-cell lymphoma currently in CR 1  Chief complaint/ Reason for visit-routine follow-up of diffuse large B-cell lymphoma  Heme/Onc history: Patient is a 66 year old male with a prior history of DVT about 15 years ago and was diagnosed with antiphospholipid antibody syndrome with a positive lupus anticoagulant.  He has remained on Coumadin since then.  He has been seeing Dr. Erlene Quan for kidney stones.  He had a CT abdomen and pelvis in August 2021 which showed thickening of the urothelium concerning for a urothelial lesion.  He underwent a cystoscopy and ureteroscopy in September 2021 which did not show any intraluminal pathology.  Plan was to get a repeat CT urogram 6 weeks following that.  He had a CT hematuria work-up again in November 2021 which showed enhancing soft tissue lesion in the proximal right ureter measuring 2.3 x 2.3 x 3.5 cm which was extending for 2.6 cm and enlarges compared to prior size of 1.5 x 1.1 x 2.9 cm.  There is no evidence of retroperitoneal adenopathy.  Left external iliac node was 1.2 cm and was unchanged as compared to prior CT scan.  He underwent a repeat ureteroscopy with stent placement.  There was no obvious filling defect but there was a slight narrowing of ureter and patient therefore underwent a CT-guided biopsy by IR   Biopsy showed B-cell lymphoma High-grade with a Ki-67 of greater than 90%. Comment:  Core biopsy sections display lymphoid tissue. There are aggregates  comprised of larger lymphocytes, interspersed with areas of small  mature  lymphocytes, and focal background bands of fibrosis. Typical follicular  architecture is not present.   Immunohistochemical studies show strong and diffuse positivity for CD20,  indicating a B cell proliferation, comprised of both smaller and larger  lymphocytes. CD3 marks scattered background T cells. The subset of  larger B cells shows positivity for CD10, BCL6, and MUM-1 (partial).  These cells are negative for BCL2, Cyclin-D1, and CD30. BCL2 appears to  show marking in the subset of smaller lymphocytes, as well as normal T  cells. Ki-67 is significantly elevated in the subset of larger B cells,  estimated at greater than 90% staining. C-myc shows dim staining in a  portion of the larger B cells, approximately 20-30%. Pancytokeratin and  CD56 are negative.   Flow cytometric studies detect an abnormal CD10+ B cell population in a  background of polytypic B cells. These CD10+ B cells represent 28% of B  cells and show very dim kappa light chain restriction, to negative light  chain expression. By light scatter, these B cells appear approximately  the same size as background T cells. There is no loss of, or aberrant  expression of, pan T-cell antigens to suggest a neoplastic T cell  process. Cell viability in this sample is sufficient for analysis, but  less than optimal.   Overall classification of this process is limited due to the small size  of core biopsy samples. The histologic and immunophenotypic findings are  compatible with involvement by a B cell lymphoma. CD10 expression, as  seen by immunohistochemistry and flow cytometry, may imply  germinal/follicular center origin of these B cells. Absence of staining  for BCL2 could suggest against follicular lymphoma, although BCL2  expression may be absent in 25-50% of cases of grade 3 follicular  lymphoma. The differential diagnosis may also include diffuse large B  cell lymphoma, given the increased proportion of larger  abnormal B  cells. Further subclassification of this process would be dependent upon  excisional biopsy or review of overall lesional architecture. There is  sufficient tissue for ancillary FISH or NGS testing, if desired.   Bone marrow biopsy was negative for lymphoma.  PET/CT scan showed:A 3.3 x 2.7 cm soft tissue lesion around the right proximal ureter with an SUV of 13.4.  There was no other hypermetabolic abdominopelvic adenopathy noted.  No hypermetabolism noted in the liver or spleen or other organs.  No intrathoracic adenopathy.  No hypermetabolic activity noted in the bones.      Interval history-patient has been having ongoing neck pain and back pain for which she follows up with orthopedics.  Otherwise denies any new complaints at this time.  He also follows up with urology for erectile dysfunction.  ECOG PS- 0 Pain scale- 0   Review of systems- Review of Systems  Constitutional:  Negative for chills, fever, malaise/fatigue and weight loss.  HENT:  Negative for congestion, ear discharge and nosebleeds.   Eyes:  Negative for blurred vision.  Respiratory:  Negative for cough, hemoptysis, sputum production, shortness of breath and wheezing.   Cardiovascular:  Negative for chest pain, palpitations, orthopnea and claudication.  Gastrointestinal:  Negative for abdominal pain, blood in stool, constipation, diarrhea, heartburn, melena, nausea and vomiting.  Genitourinary:  Negative for dysuria, flank pain, frequency, hematuria and urgency.  Musculoskeletal:  Positive for back pain and neck pain. Negative for joint pain and myalgias.  Skin:  Negative for rash.  Neurological:  Negative for dizziness, tingling, focal weakness, seizures, weakness and headaches.  Endo/Heme/Allergies:  Does not bruise/bleed easily.  Psychiatric/Behavioral:  Negative for depression and suicidal ideas. The patient does not have insomnia.       Allergies  Allergen Reactions   Losartan Potassium-Hctz Other  (See Comments)    Unknown reaction.   Morphine And Related Itching    WITH PROLONGED USE     Past Medical History:  Diagnosis Date   Anxiety    Collagen vascular disease ([place])    Depression    Diabetes mellitus without complication ([place])    DVT (deep venous thrombosis) ([place])    History of kidney stones    Hypertension    Lymphoma ([place]) 07/2020   Sleep apnea      Past Surgical History:  Procedure Laterality Date   CERVICAL FUSION     CYSTOSCOPY W/ RETROGRADES Left 04/26/2020   Procedure: CYSTOSCOPY WITH RETROGRADE PYELOGRAM;  Surgeon: Hollice Espy, MD;  Location: [facility];  Service: Urology;  Laterality: Left;   CYSTOSCOPY WITH URETEROSCOPY AND STENT PLACEMENT Right 07/02/2020   Procedure: CYSTOSCOPY WITH URETEROSCOPY AND STENT PLACEMENT;  Surgeon: Hollice Espy, MD;  Location: [facility];  Service: Urology;  Laterality: Right;   CYSTOSCOPY/URETEROSCOPY/HOLMIUM LASER/STENT PLACEMENT Right 04/26/2020   Procedure: CYSTOSCOPY/URETEROSCOPY/HOLMIUM LASER/STENT PLACEMENT;  Surgeon: Hollice Espy, MD;  Location: [facility];  Service: Urology;  Laterality: Right;   IR IMAGING GUIDED PORT INSERTION  07/27/2020   IR REMOVAL TUN ACCESS W/ PORT W/O FL MOD SED  02/08/2021   PLANTAR FASCIA SURGERY Bilateral    ROTATOR CUFF REPAIR Left    THUMB FUSION  URETERAL BIOPSY Right 04/26/2020   Procedure: URETERAL BIOPSY with ablation;  Surgeon: Hollice Espy, MD;  Location: [facility];  Service: Urology;  Laterality: Right;   URETEROSCOPY WITH HOLMIUM LASER LITHOTRIPSY      Social History   Socioeconomic History   Marital status: Single    Spouse name: Not on file   Number of children: 0   Years of education: Not on file   Highest education level: Not on file  Occupational History    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   Social Determinants of Health   Financial Resource Strain: Not on file  Food Insecurity: Not on file  Transportation Needs: Not on file  Physical Activity: Not on file  Stress: Not on file  Social Connections: Not on file  Intimate Partner Violence: Not on file    Family History  Problem Relation Age of Onset   Dementia Mother    Pancreatic cancer Father    Prostate cancer Paternal Grandmother    Leukemia Nephew      Current Outpatient Medications:    ALPRAZolam (XANAX) 0.5 MG tablet, Take 0.5 mg by mouth in the morning and at bedtime. , Disp: , Rfl:    buPROPion (WELLBUTRIN XL) 150 MG 24 hr tablet, Take 300 mg by mouth daily. , Disp: , Rfl:    diclofenac (VOLTAREN) 50 MG EC tablet, Take 50 mg by mouth daily as needed., Disp: , Rfl:    docusate sodium (COLACE) 100 MG capsule, Take 200 mg by mouth daily., Disp: , Rfl:    enoxaparin (LOVENOX) 60 MG/0.6ML injection, enoxaparin 60 mg/0.6 mL subcutaneous syringe, Disp: , Rfl:    ergocalciferol (VITAMIN D2) 1.25 MG (50000 UT) capsule, Take 50,000 Units by mouth every Monday. , Disp: , Rfl:    escitalopram (LEXAPRO) 20 MG tablet, Take 20 mg by mouth daily. , Disp: , Rfl:    gabapentin (NEURONTIN) 300 MG capsule, Take 1 capsule by mouth at bedtime., Disp: , Rfl:    glipiZIDE (GLUCOTROL XL) 10 MG 24 hr tablet, Take 10 mg by mouth daily with breakfast., Disp: , Rfl:    loratadine (CLARITIN) 10 MG tablet, Take 10 mg by mouth daily. , Disp: , Rfl:    Magnesium 500 MG TABS, Take 1,000 mg by mouth daily., Disp: , Rfl:    rosuvastatin (CRESTOR) 10 MG tablet, Take 10 mg by mouth at bedtime., Disp: , Rfl:    telmisartan (MICARDIS) 80 MG tablet, Take 40 mg by mouth every evening. , Disp: , Rfl:    warfarin (COUMADIN) 5 MG tablet,  Take 5 mg by mouth every evening. Take 1.5 tablets (7.5 mg) by mouth on Saturdays in the evening & take 1 tablet (5 mg) by mouth on Sundays, Mondays, Tuesdays, Wednesdays, Thursdays & Fridays., Disp: , Rfl:    diclofenac Sodium (VOLTAREN) 1 % GEL, Apply 1 g topically 4 (four) times daily as needed (thumb as needed for pain.). , Disp: , Rfl:    lidocaine-prilocaine (EMLA) cream, Apply to affected area once, Disp:  30 g, Rfl: 3   oxyCODONE-acetaminophen (PERCOCET) 5-325 MG tablet, Take 1-2 tablets by mouth every 4 (four) hours as needed for moderate pain or severe pain. (Patient not taking: Reported on 01/01/2022), Disp: 20 tablet, Rfl: 0  Physical exam:  Vitals:   01/01/22 1010  BP: 117/84  Pulse: 66  Resp: 20  Temp: (!) 97.4 F (36.3 C)  SpO2: 97%  Weight: 224 lb 14.4 oz (102 kg)   Physical Exam Constitutional:      General: He is not in acute distress. Cardiovascular:     Rate and Rhythm: Normal rate and regular rhythm.     Heart sounds: Normal heart sounds.  Pulmonary:     Effort: Pulmonary effort is normal.     Breath sounds: Normal breath sounds.  Abdominal:     General: Bowel sounds are normal.     Palpations: Abdomen is soft.  Lymphadenopathy:     Comments: No palpable cervical, supraclavicular, axillary or inguinal adenopathy    Skin:    General: Skin is warm and dry.  Neurological:     Mental Status: He is alert and oriented to person, place, and time.        Latest Ref Rng & Units 01/01/2022    9:55 AM  CMP  Glucose 70 - 99 mg/dL 113    BUN 8 - 23 mg/dL 22    Creatinine 0.61 - 1.24 mg/dL 1.62    Sodium 135 - 145 mmol/L 137    Potassium 3.5 - 5.1 mmol/L 4.8    Chloride 98 - 111 mmol/L 102    CO2 22 - 32 mmol/L 29    Calcium 8.9 - 10.3 mg/dL 9.1    Total Protein 6.5 - 8.1 g/dL 7.1    Total Bilirubin 0.3 - 1.2 mg/dL 0.6    Alkaline Phos 38 - 126 U/L 39    AST 15 - 41 U/L 19    ALT 0 - 44 U/L 23        Latest Ref Rng & Units 01/01/2022    9:55 AM  CBC  WBC  4.0 - 10.5 K/uL 7.4    Hemoglobin 13.0 - 17.0 g/dL 15.6    Hematocrit 39.0 - 52.0 % 47.6    Platelets 150 - 400 K/uL 220       Assessment and plan- Patient is a 66 y.o. male with history of stage I E diffuse large B-cell lymphoma s/p 3 cycles of R-CHOP and IFR T currently in CR 1 here for routine follow-up  Labs are overall unremarkable and clinically patient is doing well with no concerning signs and symptoms of recurrence based on today's exam.  No indication for surveillance imaging at this time.   Visit Diagnosis 1. Encounter for follow-up surveillance of diffuse large B-cell lymphoma      Dr. Randa Evens, MD, MPH [facility] [phone] 01/01/2022 3:12 PM

## 2022-03-03 ENCOUNTER — Other Ambulatory Visit: Payer: Self-pay

## 2022-03-14 ENCOUNTER — Other Ambulatory Visit: Payer: Self-pay | Admitting: Internal Medicine

## 2022-03-14 DIAGNOSIS — M5416 Radiculopathy, lumbar region: Secondary | ICD-10-CM

## 2022-03-21 ENCOUNTER — Encounter: Payer: Self-pay | Admitting: Oncology

## 2022-03-25 ENCOUNTER — Encounter: Payer: Self-pay | Admitting: Oncology

## 2022-03-28 ENCOUNTER — Inpatient Hospital Stay: Admission: RE | Admit: 2022-03-28 | Payer: Commercial Managed Care - PPO | Source: Ambulatory Visit

## 2022-03-31 ENCOUNTER — Other Ambulatory Visit: Payer: Self-pay | Admitting: Internal Medicine

## 2022-03-31 DIAGNOSIS — M5416 Radiculopathy, lumbar region: Secondary | ICD-10-CM

## 2022-04-09 ENCOUNTER — Ambulatory Visit
Admission: RE | Admit: 2022-04-09 | Discharge: 2022-04-09 | Disposition: A | Discharge status: Home / Self Care | Payer: Medicare Other | Source: Ambulatory Visit | Attending: Internal Medicine | Admitting: Internal Medicine

## 2022-04-09 DIAGNOSIS — M5416 Radiculopathy, lumbar region: Secondary | ICD-10-CM

## 2022-05-05 ENCOUNTER — Encounter: Payer: Self-pay | Admitting: Oncology

## 2022-05-05 ENCOUNTER — Inpatient Hospital Stay (HOSPITAL_BASED_OUTPATIENT_CLINIC_OR_DEPARTMENT_OTHER): Payer: Medicare Other | Admitting: Oncology

## 2022-05-05 ENCOUNTER — Inpatient Hospital Stay: Payer: Medicare Other | Attending: Oncology

## 2022-05-05 DIAGNOSIS — Z86718 Personal history of other venous thrombosis and embolism: Secondary | ICD-10-CM | POA: Insufficient documentation

## 2022-05-05 DIAGNOSIS — Z87891 Personal history of nicotine dependence: Secondary | ICD-10-CM | POA: Diagnosis not present

## 2022-05-05 DIAGNOSIS — I82401 Acute embolism and thrombosis of unspecified deep veins of right lower extremity: Secondary | ICD-10-CM

## 2022-05-05 DIAGNOSIS — Z08 Encounter for follow-up examination after completed treatment for malignant neoplasm: Secondary | ICD-10-CM

## 2022-05-05 DIAGNOSIS — Z9221 Personal history of antineoplastic chemotherapy: Secondary | ICD-10-CM | POA: Diagnosis not present

## 2022-05-05 DIAGNOSIS — C8339 Diffuse large B-cell lymphoma, extranodal and solid organ sites: Secondary | ICD-10-CM | POA: Insufficient documentation

## 2022-05-05 DIAGNOSIS — Z8579 Personal history of other malignant neoplasms of lymphoid, hematopoietic and related tissues: Secondary | ICD-10-CM

## 2022-05-05 DIAGNOSIS — D6861 Antiphospholipid syndrome: Secondary | ICD-10-CM | POA: Diagnosis not present

## 2022-05-05 DIAGNOSIS — Z7901 Long term (current) use of anticoagulants: Secondary | ICD-10-CM | POA: Insufficient documentation

## 2022-05-05 LAB — CBC WITH DIFFERENTIAL/PLATELET
Abs Immature Granulocytes: 0.01 10*3/uL (ref 0.00–0.07)
Basophils Absolute: 0 10*3/uL (ref 0.0–0.1)
Basophils Relative: 1 %
Eosinophils Absolute: 0.2 10*3/uL (ref 0.0–0.5)
Eosinophils Relative: 2 %
HCT: 46.5 % (ref 39.0–52.0)
Hemoglobin: 15.4 g/dL (ref 13.0–17.0)
Immature Granulocytes: 0 %
Lymphocytes Relative: 22 %
Lymphs Abs: 1.6 10*3/uL (ref 0.7–4.0)
MCH: 28.3 pg (ref 26.0–34.0)
MCHC: 33.1 g/dL (ref 30.0–36.0)
MCV: 85.5 fL (ref 80.0–100.0)
Monocytes Absolute: 0.7 10*3/uL (ref 0.1–1.0)
Monocytes Relative: 9 %
Neutro Abs: 4.8 10*3/uL (ref 1.7–7.7)
Neutrophils Relative %: 66 %
Platelets: 209 10*3/uL (ref 150–400)
RBC: 5.44 MIL/uL (ref 4.22–5.81)
RDW: 13.9 % (ref 11.5–15.5)
WBC: 7.2 10*3/uL (ref 4.0–10.5)
nRBC: 0 % (ref 0.0–0.2)

## 2022-05-05 LAB — COMPREHENSIVE METABOLIC PANEL
ALT: 22 U/L (ref 0–44)
AST: 20 U/L (ref 15–41)
Albumin: 3.9 g/dL (ref 3.5–5.0)
Alkaline Phosphatase: 37 U/L — ABNORMAL LOW (ref 38–126)
Anion gap: 4 — ABNORMAL LOW (ref 5–15)
BUN: 19 mg/dL (ref 8–23)
CO2: 27 mmol/L (ref 22–32)
Calcium: 8.9 mg/dL (ref 8.9–10.3)
Chloride: 108 mmol/L (ref 98–111)
Creatinine, Ser: 1.48 mg/dL — ABNORMAL HIGH (ref 0.61–1.24)
GFR, Estimated: 52 mL/min — ABNORMAL LOW (ref >60)
Glucose, Bld: 140 mg/dL — ABNORMAL HIGH (ref 70–99)
Potassium: 4.5 mmol/L (ref 3.5–5.1)
Sodium: 139 mmol/L (ref 135–145)
Total Bilirubin: 0.5 mg/dL (ref 0.3–1.2)
Total Protein: 6.8 g/dL (ref 6.5–8.1)

## 2022-05-05 LAB — PROTIME-INR
INR: 2.6 — ABNORMAL HIGH (ref 0.8–1.2)
Prothrombin Time: 27.5 seconds — ABNORMAL HIGH (ref 11.4–15.2)

## 2022-05-05 LAB — LACTATE DEHYDROGENASE: LDH: 118 U/L (ref 98–192)

## 2022-05-05 NOTE — Progress Notes (Signed)
Hematology/Oncology Consult note [facility]  [phone] [phone]  Patient Care Team: Rusty Aus, MD as PCP - General (Internal Medicine) Sindy Guadeloupe, MD as Consulting Physician (Oncology) Noreene Filbert, MD as Consulting Physician (Radiation Oncology)   Name of the patient: Eddie Ryan  [id number]  October 31, 1955   Date of visit: 05/05/22  Diagnosis- stage I E high-grade B-cell lymphoma currently in CR 1    Chief complaint/ Reason for visit-routine follow-up of diffuse large B-cell lymphoma  Heme/Onc history: Patient is a 66 year old male with a prior history of DVT about 15 years ago and was diagnosed with antiphospholipid antibody syndrome with a positive lupus anticoagulant.  He has remained on Coumadin since then.  He has been seeing Dr. Erlene Quan for kidney stones.  He had a CT abdomen and pelvis in August 2021 which showed thickening of the urothelium concerning for a urothelial lesion.  He underwent a cystoscopy and ureteroscopy in September 2021 which did not show any intraluminal pathology.  Plan was to get a repeat CT urogram 6 weeks following that.  He had a CT hematuria work-up again in November 2021 which showed enhancing soft tissue lesion in the proximal right ureter measuring 2.3 x 2.3 x 3.5 cm which was extending for 2.6 cm and enlarges compared to prior size of 1.5 x 1.1 x 2.9 cm.  There is no evidence of retroperitoneal adenopathy.  Left external iliac node was 1.2 cm and was unchanged as compared to prior CT scan.  He underwent a repeat ureteroscopy with stent placement.  There was no obvious filling defect but there was a slight narrowing of ureter and patient therefore underwent a CT-guided biopsy by IR   Biopsy showed B-cell lymphoma High-grade with a Ki-67 of greater than 90%. Comment:  Core biopsy sections display lymphoid tissue. There are aggregates  comprised of larger lymphocytes, interspersed with areas of  small mature  lymphocytes, and focal background bands of fibrosis. Typical follicular  architecture is not present.   Immunohistochemical studies show strong and diffuse positivity for CD20,  indicating a B cell proliferation, comprised of both smaller and larger  lymphocytes. CD3 marks scattered background T cells. The subset of  larger B cells shows positivity for CD10, BCL6, and MUM-1 (partial).  These cells are negative for BCL2, Cyclin-D1, and CD30. BCL2 appears to  show marking in the subset of smaller lymphocytes, as well as normal T  cells. Ki-67 is significantly elevated in the subset of larger B cells,  estimated at greater than 90% staining. C-myc shows dim staining in a  portion of the larger B cells, approximately 20-30%. Pancytokeratin and  CD56 are negative.   Flow cytometric studies detect an abnormal CD10+ B cell population in a  background of polytypic B cells. These CD10+ B cells represent 28% of B  cells and show very dim kappa light chain restriction, to negative light  chain expression. By light scatter, these B cells appear approximately  the same size as background T cells. There is no loss of, or aberrant  expression of, pan T-cell antigens to suggest a neoplastic T cell  process. Cell viability in this sample is sufficient for analysis, but  less than optimal.   Overall classification of this process is limited due to the small size  of core biopsy samples. The histologic and immunophenotypic findings are  compatible with involvement by a B cell lymphoma. CD10 expression, as  seen by immunohistochemistry and flow cytometry,  may imply  germinal/follicular center origin of these B cells. Absence of staining  for BCL2 could suggest against follicular lymphoma, although BCL2  expression may be absent in 25-50% of cases of grade 3 follicular  lymphoma. The differential diagnosis may also include diffuse large B  cell lymphoma, given the increased proportion of  larger abnormal B  cells. Further subclassification of this process would be dependent upon  excisional biopsy or review of overall lesional architecture. There is  sufficient tissue for ancillary FISH or NGS testing, if desired.   Bone marrow biopsy was negative for lymphoma.  PET/CT scan showed:A 3.3 x 2.7 cm soft tissue lesion around the right proximal ureter with an SUV of 13.4.  There was no other hypermetabolic abdominopelvic adenopathy noted.  No hypermetabolism noted in the liver or spleen or other organs.  No intrathoracic adenopathy.  No hypermetabolic activity noted in the bones.    Interval history-patient has ongoing back pain for which she is following up with orthopedics and is getting injections in his spine.  Also has chronic neck pain.  Appetite and weight are otherwise stable.  Denies any lumps or bumps anywhere.  Denies any drenching night sweats  ECOG PS- 0 Pain scale- 0  Review of systems- Review of Systems  Constitutional:  Negative for chills, fever, malaise/fatigue and weight loss.  HENT:  Negative for congestion, ear discharge and nosebleeds.   Eyes:  Negative for blurred vision.  Respiratory:  Negative for cough, hemoptysis, sputum production, shortness of breath and wheezing.   Cardiovascular:  Negative for chest pain, palpitations, orthopnea and claudication.  Gastrointestinal:  Negative for abdominal pain, blood in stool, constipation, diarrhea, heartburn, melena, nausea and vomiting.  Genitourinary:  Negative for dysuria, flank pain, frequency, hematuria and urgency.  Musculoskeletal:  Positive for back pain and neck pain. Negative for joint pain and myalgias.  Skin:  Negative for rash.  Neurological:  Negative for dizziness, tingling, focal weakness, seizures, weakness and headaches.  Endo/Heme/Allergies:  Does not bruise/bleed easily.  Psychiatric/Behavioral:  Negative for depression and suicidal ideas. The patient does not have insomnia.       Allergies   Allergen Reactions   Losartan Potassium-Hctz Other (See Comments)    Unknown reaction.   Morphine And Related Itching    WITH PROLONGED USE     Past Medical History:  Diagnosis Date   Anxiety    Collagen vascular disease ([place])    Depression    Diabetes mellitus without complication ([place])    DVT (deep venous thrombosis) ([place])    History of kidney stones    Hypertension    Lymphoma ([place]) 07/2020   Sleep apnea      Past Surgical History:  Procedure Laterality Date   CERVICAL FUSION     CYSTOSCOPY W/ RETROGRADES Left 04/26/2020   Procedure: CYSTOSCOPY WITH RETROGRADE PYELOGRAM;  Surgeon: Hollice Espy, MD;  Location: [facility];  Service: Urology;  Laterality: Left;   CYSTOSCOPY WITH URETEROSCOPY AND STENT PLACEMENT Right 07/02/2020   Procedure: CYSTOSCOPY WITH URETEROSCOPY AND STENT PLACEMENT;  Surgeon: Hollice Espy, MD;  Location: [facility];  Service: Urology;  Laterality: Right;   CYSTOSCOPY/URETEROSCOPY/HOLMIUM LASER/STENT PLACEMENT Right 04/26/2020   Procedure: CYSTOSCOPY/URETEROSCOPY/HOLMIUM LASER/STENT PLACEMENT;  Surgeon: Hollice Espy, MD;  Location: [facility];  Service: Urology;  Laterality: Right;   IR IMAGING GUIDED PORT INSERTION  07/27/2020   IR REMOVAL TUN ACCESS W/ PORT W/O FL MOD SED  02/08/2021   PLANTAR FASCIA SURGERY Bilateral    ROTATOR CUFF REPAIR  Left    THUMB FUSION     URETERAL BIOPSY Right 04/26/2020   Procedure: URETERAL BIOPSY with ablation;  Surgeon: Hollice Espy, MD;  Location: [facility];  Service: Urology;  Laterality: Right;   URETEROSCOPY WITH HOLMIUM LASER LITHOTRIPSY      Social History   Socioeconomic History   Marital status: Single    Spouse name: Not on file   Number of children: 0   Years of education: Not on file   Highest education level: Not on file  Occupational History    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   Social Determinants of Health   Financial Resource Strain: Not on file  Food Insecurity: Not on file  Transportation Needs: Not on file  Physical Activity: Not on file  Stress: Not on file  Social Connections: Not on file  Intimate Partner Violence: Not on file    Family History  Problem Relation Age of Onset   Dementia Mother    Pancreatic cancer Father    Prostate cancer Paternal Grandmother    Leukemia Nephew      Current Outpatient Medications:    ALPRAZolam (XANAX) 0.5 MG tablet, Take 0.5 mg by mouth in the morning and at bedtime. , Disp: , Rfl:    buPROPion (WELLBUTRIN XL) 150 MG 24 hr tablet, Take 300 mg by mouth daily. , Disp: , Rfl:    diclofenac (VOLTAREN) 50 MG EC tablet, Take 50 mg by mouth daily., Disp: , Rfl:    diclofenac Sodium (VOLTAREN) 1 % GEL, Apply 1 g topically 4 (four) times daily as needed (thumb as needed for pain.). , Disp: , Rfl:    docusate sodium (COLACE) 100 MG capsule, Take 200 mg by mouth daily., Disp: , Rfl:    ergocalciferol (VITAMIN D2) 1.25 MG (50000 UT) capsule, Take 50,000 Units by mouth every Monday. , Disp: , Rfl:    escitalopram (LEXAPRO) 20 MG tablet, Take 20 mg by mouth daily. , Disp: , Rfl:    gabapentin (NEURONTIN) 300 MG capsule, Take 300 mg by mouth 2 (two) times daily., Disp: , Rfl:    glipiZIDE (GLUCOTROL XL) 10 MG 24 hr tablet, Take 10 mg by mouth daily with breakfast., Disp: , Rfl:    loratadine (CLARITIN) 10 MG tablet, Take 10 mg by mouth daily. , Disp: , Rfl:    Magnesium 500 MG TABS, Take 1,000 mg by mouth daily., Disp: , Rfl:    oxyCODONE-acetaminophen (PERCOCET) 5-325 MG tablet, Take 1-2 tablets by mouth every 4 (four) hours as needed for moderate pain or  severe pain., Disp: 20 tablet, Rfl: 0   rosuvastatin (CRESTOR) 10 MG tablet, Take 10 mg by mouth at bedtime., Disp: , Rfl:    telmisartan (MICARDIS) 80 MG tablet, Take 40 mg by mouth every evening. , Disp: , Rfl:    warfarin (COUMADIN) 5 MG tablet, Take 5 mg by mouth every evening. Take 1.5 tablets (7.5 mg) by mouth on Saturdays in the evening & take 1 tablet (5 mg) by mouth on  Sundays, Mondays, Tuesdays, Wednesdays, Thursdays & Fridays., Disp: , Rfl:    enoxaparin (LOVENOX) 60 MG/0.6ML injection, enoxaparin 60 mg/0.6 mL subcutaneous syringe (Patient not taking: Reported on 05/05/2022), Disp: , Rfl:   Physical exam:  Physical Exam Constitutional:      General: He is not in acute distress. Cardiovascular:     Rate and Rhythm: Normal rate and regular rhythm.     Heart sounds: Normal heart sounds.  Pulmonary:     Effort: Pulmonary effort is normal.     Breath sounds: Normal breath sounds.  Abdominal:     General: Bowel sounds are normal.     Palpations: Abdomen is soft.     Comments: No palpable hepatosplenomegaly   Lymphadenopathy:     Comments: No palpable cervical, supraclavicular, axillary or inguinal adenopathy    Skin:    General: Skin is warm and dry.  Neurological:     Mental Status: He is alert and oriented to person, place, and time.         Latest Ref Rng & Units 05/05/2022   10:46 AM  CMP  Glucose 70 - 99 mg/dL 140   BUN 8 - 23 mg/dL 19   Creatinine 0.61 - 1.24 mg/dL 1.48   Sodium 135 - 145 mmol/L 139   Potassium 3.5 - 5.1 mmol/L 4.5   Chloride 98 - 111 mmol/L 108   CO2 22 - 32 mmol/L 27   Calcium 8.9 - 10.3 mg/dL 8.9   Total Protein 6.5 - 8.1 g/dL 6.8   Total Bilirubin 0.3 - 1.2 mg/dL 0.5   Alkaline Phos 38 - 126 U/L 37   AST 15 - 41 U/L 20   ALT 0 - 44 U/L 22       Latest Ref Rng & Units 05/05/2022   10:46 AM  CBC  WBC 4.0 - 10.5 K/uL 7.2   Hemoglobin 13.0 - 17.0 g/dL 15.4   Hematocrit 39.0 - 52.0 % 46.5   Platelets 150 - 400 K/uL 209     No images  are attached to the encounter.  MR LUMBAR SPINE WO CONTRAST  Result Date: 04/10/2022 CLINICAL DATA:  Low back pain with hip pain. History of ureter cancer. EXAM: MRI LUMBAR SPINE WITHOUT CONTRAST TECHNIQUE: Multiplanar, multisequence MR imaging of the lumbar spine was performed. No intravenous contrast was administered. COMPARISON:  None Available. FINDINGS: Segmentation:  5 lumbar vertebra.  Lowest disc space L5-S1 Alignment: Slight retrolisthesis L3-4 and slight anterolisthesis L4-5 Vertebrae:  Negative for fracture or mass. Conus medullaris and cauda equina: Conus extends to the L1-2 level. Conus and cauda equina appear normal. Paraspinal and other soft tissues: Bilateral renal cysts. No paraspinous mass or adenopathy Disc levels: L1-2: Mild disc and mild facet degeneration.  Negative for stenosis L2-3: Mild disc and mild facet degeneration.  Negative for stenosis L3-4: Disc degeneration with disc bulging and endplate spurring. Bilateral facet hypertrophy. Moderate to severe subarticular and foraminal stenosis on the right. Moderate subarticular and foraminal stenosis on the left. Mild central canal stenosis. L4-5: Disc degeneration with disc bulging and central disc protrusion. Advanced facet degeneration bilaterally. Severe subarticular and foraminal stenosis bilaterally. Central canal patent L5-S1: Advanced disc degeneration with disc space narrowing and diffuse endplate spurring. Mild subarticular stenosis bilaterally. Neural foramina patent bilaterally. IMPRESSION: 1. Mild central canal stenosis at L3-4. Subarticular and foraminal stenosis right greater than left. 2. Severe subarticular and foraminal stenosis bilaterally L4-5. Central canal patent 3. Mild subarticular stenosis bilaterally L5-S1. Electronically Signed   By:  Franchot Gallo M.D.   On: 04/10/2022 11:45     Assessment and plan- Patient is a 66 y.o. male with history of stage I E diffuse large B-cell lymphoma s/p 3 cycles of R-CHOP and IFR  T currently in CR 1.  He is here for routine follow-up  Clinically patient is doing well with no concerning signs and symptoms of recurrence based on today's exam.  He is now over 1 year from the time he completed chemotherapy.  No indication for surveillance imaging at this time unless there are any B symptoms or concerns on physicalExam.  He did undergo MRI lumbar spine in August 2023 which also did not show any paraspinal masses or adenopathy.  I will plan to see him back in 6 months with CBC with differential CMP and LDH   Visit Diagnosis 1. Diffuse large B-cell lymphoma of extranodal site excluding spleen and other solid organs ([place])   2. Encounter for follow-up surveillance of diffuse large B-cell lymphoma      Dr. Randa Evens, MD, MPH [facility] [phone] 05/05/2022 1:17 PM

## 2022-09-22 ENCOUNTER — Other Ambulatory Visit (INDEPENDENT_AMBULATORY_CARE_PROVIDER_SITE_OTHER): Payer: Self-pay | Admitting: Neurosurgery

## 2022-09-22 DIAGNOSIS — I739 Peripheral vascular disease, unspecified: Secondary | ICD-10-CM

## 2022-10-16 ENCOUNTER — Encounter (INDEPENDENT_AMBULATORY_CARE_PROVIDER_SITE_OTHER): Payer: Self-pay | Admitting: Vascular Surgery

## 2022-10-16 ENCOUNTER — Ambulatory Visit (INDEPENDENT_AMBULATORY_CARE_PROVIDER_SITE_OTHER): Payer: Medicare Other

## 2022-10-16 DIAGNOSIS — I739 Peripheral vascular disease, unspecified: Secondary | ICD-10-CM

## 2022-10-19 LAB — VAS US ABI WITH/WO TBI
Left ABI: 1.24
Right ABI: 1

## 2022-11-04 ENCOUNTER — Inpatient Hospital Stay: Payer: Medicare Other

## 2022-11-04 ENCOUNTER — Inpatient Hospital Stay: Payer: Medicare Other | Attending: Oncology | Admitting: Oncology

## 2022-11-04 ENCOUNTER — Encounter: Payer: Self-pay | Admitting: Oncology

## 2022-11-04 VITALS — BP 112/78 | HR 84 | Temp 97.2°F | Resp 18 | Ht 73.0 in | Wt 226.6 lb

## 2022-11-04 DIAGNOSIS — Z08 Encounter for follow-up examination after completed treatment for malignant neoplasm: Secondary | ICD-10-CM | POA: Diagnosis not present

## 2022-11-04 DIAGNOSIS — Z8042 Family history of malignant neoplasm of prostate: Secondary | ICD-10-CM | POA: Diagnosis not present

## 2022-11-04 DIAGNOSIS — Z86718 Personal history of other venous thrombosis and embolism: Secondary | ICD-10-CM | POA: Diagnosis not present

## 2022-11-04 DIAGNOSIS — Z8579 Personal history of other malignant neoplasms of lymphoid, hematopoietic and related tissues: Secondary | ICD-10-CM

## 2022-11-04 DIAGNOSIS — G8929 Other chronic pain: Secondary | ICD-10-CM | POA: Insufficient documentation

## 2022-11-04 DIAGNOSIS — Z818 Family history of other mental and behavioral disorders: Secondary | ICD-10-CM | POA: Diagnosis not present

## 2022-11-04 DIAGNOSIS — Z7901 Long term (current) use of anticoagulants: Secondary | ICD-10-CM | POA: Diagnosis not present

## 2022-11-04 DIAGNOSIS — D6862 Lupus anticoagulant syndrome: Secondary | ICD-10-CM | POA: Diagnosis not present

## 2022-11-04 DIAGNOSIS — Z79899 Other long term (current) drug therapy: Secondary | ICD-10-CM | POA: Insufficient documentation

## 2022-11-04 DIAGNOSIS — C8339 Diffuse large B-cell lymphoma, extranodal and solid organ sites: Secondary | ICD-10-CM | POA: Diagnosis present

## 2022-11-04 DIAGNOSIS — Z800 Family history of malignant neoplasm of digestive organs: Secondary | ICD-10-CM | POA: Diagnosis not present

## 2022-11-04 DIAGNOSIS — Z885 Allergy status to narcotic agent status: Secondary | ICD-10-CM | POA: Diagnosis not present

## 2022-11-04 DIAGNOSIS — Z87442 Personal history of urinary calculi: Secondary | ICD-10-CM | POA: Insufficient documentation

## 2022-11-04 DIAGNOSIS — Z87891 Personal history of nicotine dependence: Secondary | ICD-10-CM | POA: Diagnosis not present

## 2022-11-04 DIAGNOSIS — Z832 Family history of diseases of the blood and blood-forming organs and certain disorders involving the immune mechanism: Secondary | ICD-10-CM | POA: Diagnosis not present

## 2022-11-04 DIAGNOSIS — N200 Calculus of kidney: Secondary | ICD-10-CM | POA: Diagnosis not present

## 2022-11-04 LAB — COMPREHENSIVE METABOLIC PANEL
ALT: 20 U/L (ref 0–44)
AST: 24 U/L (ref 15–41)
Albumin: 3.9 g/dL (ref 3.5–5.0)
Alkaline Phosphatase: 32 U/L — ABNORMAL LOW (ref 38–126)
Anion gap: 9 (ref 5–15)
BUN: 20 mg/dL (ref 8–23)
CO2: 24 mmol/L (ref 22–32)
Calcium: 8.8 mg/dL — ABNORMAL LOW (ref 8.9–10.3)
Chloride: 105 mmol/L (ref 98–111)
Creatinine, Ser: 1.58 mg/dL — ABNORMAL HIGH (ref 0.61–1.24)
GFR, Estimated: 48 mL/min — ABNORMAL LOW (ref >60)
Glucose, Bld: 186 mg/dL — ABNORMAL HIGH (ref 70–99)
Potassium: 4.4 mmol/L (ref 3.5–5.1)
Sodium: 138 mmol/L (ref 135–145)
Total Bilirubin: 0.7 mg/dL (ref 0.3–1.2)
Total Protein: 6.4 g/dL — ABNORMAL LOW (ref 6.5–8.1)

## 2022-11-04 LAB — CBC WITH DIFFERENTIAL/PLATELET
Abs Immature Granulocytes: 0.02 10*3/uL (ref 0.00–0.07)
Basophils Absolute: 0 10*3/uL (ref 0.0–0.1)
Basophils Relative: 1 %
Eosinophils Absolute: 0.2 10*3/uL (ref 0.0–0.5)
Eosinophils Relative: 3 %
HCT: 43.4 % (ref 39.0–52.0)
Hemoglobin: 14.2 g/dL (ref 13.0–17.0)
Immature Granulocytes: 0 %
Lymphocytes Relative: 27 %
Lymphs Abs: 1.8 10*3/uL (ref 0.7–4.0)
MCH: 28.1 pg (ref 26.0–34.0)
MCHC: 32.7 g/dL (ref 30.0–36.0)
MCV: 85.9 fL (ref 80.0–100.0)
Monocytes Absolute: 0.6 10*3/uL (ref 0.1–1.0)
Monocytes Relative: 10 %
Neutro Abs: 3.8 10*3/uL (ref 1.7–7.7)
Neutrophils Relative %: 59 %
Platelets: 171 10*3/uL (ref 150–400)
RBC: 5.05 MIL/uL (ref 4.22–5.81)
RDW: 14.4 % (ref 11.5–15.5)
WBC: 6.5 10*3/uL (ref 4.0–10.5)
nRBC: 0 % (ref 0.0–0.2)

## 2022-11-04 LAB — LACTATE DEHYDROGENASE: LDH: 132 U/L (ref 98–192)

## 2022-11-04 NOTE — Progress Notes (Signed)
Hematology/Oncology Consult note [facility]  Telephone:([phone] [phone]  Patient Care Team: Rusty Aus, MD as PCP - General (Internal Medicine) Sindy Guadeloupe, MD as Consulting Physician (Oncology) Noreene Filbert, MD as Consulting Physician (Radiation Oncology)   Name of the patient: Eddie Ryan  [id number]  20-Mar-1956   Date of visit: 11/04/22  Diagnosis-  stage I E high-grade B-cell lymphoma currently in CR 1   Chief complaint/ Reason for visit-routine surveillance visit for diffuse large B-cell lymphoma  Heme/Onc history: Patient is a 67 year old male with a prior history of DVT about 15 years ago and was diagnosed with antiphospholipid antibody syndrome with a positive lupus anticoagulant.  He has remained on Coumadin since then.  He has been seeing Dr. Erlene Quan for kidney stones.  He had a CT abdomen and pelvis in August 2021 which showed thickening of the urothelium concerning for a urothelial lesion.  He underwent a cystoscopy and ureteroscopy in September 2021 which did not show any intraluminal pathology.  Plan was to get a repeat CT urogram 6 weeks following that.  He had a CT hematuria work-up again in November 2021 which showed enhancing soft tissue lesion in the proximal right ureter measuring 2.3 x 2.3 x 3.5 cm which was extending for 2.6 cm and enlarges compared to prior size of 1.5 x 1.1 x 2.9 cm.  There is no evidence of retroperitoneal adenopathy.  Left external iliac node was 1.2 cm and was unchanged as compared to prior CT scan.  He underwent a repeat ureteroscopy with stent placement.  There was no obvious filling defect but there was a slight narrowing of ureter and patient therefore underwent a CT-guided biopsy by IR   Biopsy showed B-cell lymphoma High-grade with a Ki-67 of greater than 90%. Comment:  Core biopsy sections display lymphoid tissue. There are aggregates  comprised of larger lymphocytes, interspersed with  areas of small mature  lymphocytes, and focal background bands of fibrosis. Typical follicular  architecture is not present.   Immunohistochemical studies show strong and diffuse positivity for CD20,  indicating a B cell proliferation, comprised of both smaller and larger  lymphocytes. CD3 marks scattered background T cells. The subset of  larger B cells shows positivity for CD10, BCL6, and MUM-1 (partial).  These cells are negative for BCL2, Cyclin-D1, and CD30. BCL2 appears to  show marking in the subset of smaller lymphocytes, as well as normal T  cells. Ki-67 is significantly elevated in the subset of larger B cells,  estimated at greater than 90% staining. C-myc shows dim staining in a  portion of the larger B cells, approximately 20-30%. Pancytokeratin and  CD56 are negative.   Flow cytometric studies detect an abnormal CD10+ B cell population in a  background of polytypic B cells. These CD10+ B cells represent 28% of B  cells and show very dim kappa light chain restriction, to negative light  chain expression. By light scatter, these B cells appear approximately  the same size as background T cells. There is no loss of, or aberrant  expression of, pan T-cell antigens to suggest a neoplastic T cell  process. Cell viability in this sample is sufficient for analysis, but  less than optimal.   Overall classification of this process is limited due to the small size  of core biopsy samples. The histologic and immunophenotypic findings are  compatible with involvement by a B cell lymphoma. CD10 expression, as  seen by immunohistochemistry and flow  cytometry, may imply  germinal/follicular center origin of these B cells. Absence of staining  for BCL2 could suggest against follicular lymphoma, although BCL2  expression may be absent in 25-50% of cases of grade 3 follicular  lymphoma. The differential diagnosis may also include diffuse large B  cell lymphoma, given the increased  proportion of larger abnormal B  cells. Further subclassification of this process would be dependent upon  excisional biopsy or review of overall lesional architecture. There is  sufficient tissue for ancillary FISH or NGS testing, if desired.   Bone marrow biopsy was negative for lymphoma.  PET/CT scan showed:A 3.3 x 2.7 cm soft tissue lesion around the right proximal ureter with an SUV of 13.4.  There was no other hypermetabolic abdominopelvic adenopathy noted.  No hypermetabolism noted in the liver or spleen or other organs.  No intrathoracic adenopathy.  No hypermetabolic activity noted in the bones.      Interval history-patient has chronic lower back pain as well as neck pain for which she follows up with orthopedics.  Denies any new complaints at this time.  He has undergone MRI of his spine which has shown some degenerative findings as well as disc bulge but no other acute pathology.  ECOG PS- 1 Pain scale- 3   Review of systems- Review of Systems  Constitutional:  Negative for chills, fever, malaise/fatigue and weight loss.  HENT:  Negative for congestion, ear discharge and nosebleeds.   Eyes:  Negative for blurred vision.  Respiratory:  Negative for cough, hemoptysis, sputum production, shortness of breath and wheezing.   Cardiovascular:  Negative for chest pain, palpitations, orthopnea and claudication.  Gastrointestinal:  Negative for abdominal pain, blood in stool, constipation, diarrhea, heartburn, melena, nausea and vomiting.  Genitourinary:  Negative for dysuria, flank pain, frequency, hematuria and urgency.  Musculoskeletal:  Positive for back pain and neck pain. Negative for joint pain and myalgias.  Skin:  Negative for rash.  Neurological:  Negative for dizziness, tingling, focal weakness, seizures, weakness and headaches.  Endo/Heme/Allergies:  Does not bruise/bleed easily.  Psychiatric/Behavioral:  Negative for depression and suicidal ideas. The patient does not have  insomnia.       Allergies  Allergen Reactions   Losartan Potassium-Hctz Other (See Comments)    Unknown reaction.   Morphine And Related Itching    WITH PROLONGED USE     Past Medical History:  Diagnosis Date   Anxiety    Collagen vascular disease ([place])    Depression    Diabetes mellitus without complication ([place])    DVT (deep venous thrombosis) ([place])    History of kidney stones    Hypertension    Lymphoma ([place]) 07/2020   Sleep apnea      Past Surgical History:  Procedure Laterality Date   CERVICAL FUSION     CYSTOSCOPY W/ RETROGRADES Left 04/26/2020   Procedure: CYSTOSCOPY WITH RETROGRADE PYELOGRAM;  Surgeon: Hollice Espy, MD;  Location: [facility];  Service: Urology;  Laterality: Left;   CYSTOSCOPY WITH URETEROSCOPY AND STENT PLACEMENT Right 07/02/2020   Procedure: CYSTOSCOPY WITH URETEROSCOPY AND STENT PLACEMENT;  Surgeon: Hollice Espy, MD;  Location: [facility];  Service: Urology;  Laterality: Right;   CYSTOSCOPY/URETEROSCOPY/HOLMIUM LASER/STENT PLACEMENT Right 04/26/2020   Procedure: CYSTOSCOPY/URETEROSCOPY/HOLMIUM LASER/STENT PLACEMENT;  Surgeon: Hollice Espy, MD;  Location: [facility];  Service: Urology;  Laterality: Right;   IR IMAGING GUIDED PORT INSERTION  07/27/2020   IR REMOVAL TUN ACCESS W/ PORT W/O FL MOD SED  02/08/2021   PLANTAR FASCIA  SURGERY Bilateral    ROTATOR CUFF REPAIR Left    THUMB FUSION     URETERAL BIOPSY Right 04/26/2020   Procedure: URETERAL BIOPSY with ablation;  Surgeon: Hollice Espy, MD;  Location: [facility];  Service: Urology;  Laterality: Right;   URETEROSCOPY WITH HOLMIUM LASER LITHOTRIPSY      Social History   Socioeconomic History   Marital status: Single    Spouse name: Not on file   Number of children: 0   Years of education: Not on file   Highest education level: Not on file  Occupational History    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   Social Determinants of Health   Financial Resource Strain: Not on file  Food Insecurity: Not on file  Transportation Needs: Not on file  Physical Activity: Not on file  Stress: Not on file  Social Connections: Not on file  Intimate Partner Violence: Not on file    Family History  Problem Relation Age of Onset   Dementia Mother    Pancreatic cancer Father    Prostate cancer Paternal Grandmother    Leukemia Nephew      Current Outpatient Medications:    ALPRAZolam (XANAX) 0.5 MG tablet, Take 0.5 mg by mouth in the morning and at bedtime. , Disp: , Rfl:    buPROPion (WELLBUTRIN XL) 150 MG 24 hr tablet, Take 300 mg by mouth daily. , Disp: , Rfl:    diclofenac (VOLTAREN) 50 MG EC tablet, Take 50 mg by mouth daily., Disp: , Rfl:    diclofenac Sodium (VOLTAREN) 1 % GEL, Apply 1 g topically 4 (four) times daily as needed (thumb as needed for pain.). , Disp: , Rfl:    docusate sodium (COLACE) 100 MG capsule, Take 200 mg by mouth daily., Disp: , Rfl:    ergocalciferol (VITAMIN D2) 1.25 MG (50000 UT) capsule, Take 50,000 Units by mouth every Monday. , Disp: , Rfl:    escitalopram (LEXAPRO) 20 MG tablet, Take 20 mg by mouth daily. , Disp: , Rfl:    glipiZIDE (GLUCOTROL XL) 10 MG 24 hr tablet, Take 10 mg by mouth daily with breakfast., Disp: , Rfl:    loratadine (CLARITIN) 10 MG tablet, Take 10 mg by mouth daily. , Disp: , Rfl:    Magnesium 500 MG TABS, Take 1,000 mg by mouth daily., Disp: , Rfl:    oxyCODONE-acetaminophen (PERCOCET) 5-325 MG tablet, Take 1-2 tablets by mouth every 4 (four) hours as needed for moderate pain or severe pain., Disp: 20 tablet, Rfl: 0   rosuvastatin (CRESTOR) 10 MG  tablet, Take 10 mg by mouth at bedtime., Disp: , Rfl:    telmisartan (MICARDIS) 80 MG tablet, Take 40 mg by mouth every evening. , Disp: , Rfl:    warfarin (COUMADIN) 5 MG tablet, Take 5 mg by mouth every evening. Take 1.5 tablets (7.5 mg) by mouth on Saturdays in the evening & take 1 tablet (5 mg) by mouth on Sundays, Mondays, Tuesdays, Wednesdays, Thursdays & Fridays., Disp: , Rfl:  Physical exam:  Vitals:   11/04/22 1108  BP: 112/78  Pulse: 84  Resp: 18  Temp: (!) 97.2 F (36.2 C)  TempSrc: Tympanic  SpO2: 96%  Weight: 226 lb 9.6 oz (102.8 kg)  Height: 6\' 1"  (1.854 m)   Physical Exam Cardiovascular:     Rate and Rhythm: Normal rate and regular rhythm.     Heart sounds: Normal heart sounds.  Pulmonary:     Effort: Pulmonary effort is normal.     Breath sounds: Normal breath sounds.  Abdominal:     General: Bowel sounds are normal.     Palpations: Abdomen is soft.     Comments: No palpable hepatosplenomegaly  Lymphadenopathy:     Comments: No palpable cervical, supraclavicular, axillary or inguinal adenopathy    Skin:    General: Skin is warm and dry.  Neurological:     Mental Status: He is alert and oriented to person, place, and time.         Latest Ref Rng & Units 11/04/2022   10:54 AM  CMP  Glucose 70 - 99 mg/dL 186   BUN 8 - 23 mg/dL 20   Creatinine 0.61 - 1.24 mg/dL 1.58   Sodium 135 - 145 mmol/L 138   Potassium 3.5 - 5.1 mmol/L 4.4   Chloride 98 - 111 mmol/L 105   CO2 22 - 32 mmol/L 24   Calcium 8.9 - 10.3 mg/dL 8.8   Total Protein 6.5 - 8.1 g/dL 6.4   Total Bilirubin 0.3 - 1.2 mg/dL 0.7   Alkaline Phos 38 - 126 U/L 32   AST 15 - 41 U/L 24   ALT 0 - 44 U/L 20       Latest Ref Rng & Units 11/04/2022   10:54 AM  CBC  WBC 4.0 - 10.5 K/uL 6.5   Hemoglobin 13.0 - 17.0 g/dL 14.2   Hematocrit 39.0 - 52.0 % 43.4   Platelets 150 - 400 K/uL 171     No images are attached to the encounter.  VAS [place] ABI WITH/WO TBI  Result Date: 10/19/2022  LOWER  EXTREMITY DOPPLER STUDY Patient Name:  Eddie Ryan  Date of Exam:   10/16/2022 Medical Rec #: [id number]       Accession #:    [id number] Date of Birth: 15-Feb-1956        Patient Gender: M Patient Age:   44 years Exam Location:  [facility] Procedure:      VAS [place] ABI WITH/WO TBI Referring Phys: Hortencia Pilar --------------------------------------------------------------------------------  Indications: Claudication.  Performing Technologist: Almira Coaster RVS  Examination Guidelines: A complete evaluation includes at minimum, Doppler waveform signals and systolic blood pressure reading at the level of bilateral brachial, anterior tibial, and posterior tibial arteries, when vessel segments are accessible. Bilateral testing is considered an integral part of a complete examination. Photoelectric Plethysmograph (PPG) waveforms and toe systolic pressure readings are included as required and additional duplex testing as needed. Limited examinations for reoccurring indications may be performed as noted.  ABI Findings: +---------+------------------+-----+---------+--------+ Right    Rt Pressure (mmHg)IndexWaveform Comment  +---------+------------------+-----+---------+--------+ Brachial 125                                      +---------+------------------+-----+---------+--------+ ATA      127               1.00 triphasic         +---------+------------------+-----+---------+--------+  PTA      126               0.99 triphasic         +---------+------------------+-----+---------+--------+ Great Toe131               1.03 Normal            +---------+------------------+-----+---------+--------+ +---------+------------------+-----+---------+-------+ Left     Lt Pressure (mmHg)IndexWaveform Comment +---------+------------------+-----+---------+-------+ Brachial 127                                     +---------+------------------+-----+---------+-------+ ATA      158                1.24 triphasic        +---------+------------------+-----+---------+-------+ PTA      14                0.11 triphasic        +---------+------------------+-----+---------+-------+ Great Toe169               1.33 Normal           +---------+------------------+-----+---------+-------+ +-------+-----------+-----------+------------+------------+ ABI/TBIToday's ABIToday's TBIPrevious ABIPrevious TBI +-------+-----------+-----------+------------+------------+ Right  1.00       1.03                                +-------+-----------+-----------+------------+------------+ Left   1.24       1.33                                +-------+-----------+-----------+------------+------------+  Summary: Right: Resting right ankle-brachial index is within normal range. The right toe-brachial index is normal. Left: Resting left ankle-brachial index is within normal range. The left toe-brachial index is normal. *See table(s) above for measurements and observations.  Electronically signed by Hortencia Pilar MD on 10/19/2022 at 4:12:02 PM.    Final      Assessment and plan- Patient is a 67 y.o. male with history of stage I E diffuse large B-cell lymphoma s/p 3 cycles of R-CHOP and IFRT. He is here for routine f/u visit  Clinically patient is doing well with no concerning signs and symptoms of recurrence based on today's exam.  He has no palpable adenopathy and no B symptoms.  He has had imaging for his spine for his back pain as well as neck pain which has not shown any acute pathology as far as lymphoma is concerned.  I will however plan to get a CT chest abdomen and pelvis with contrast at this time.  He completed treatment in September 2022.  I will see him back in 6 months with CBC with differential CMP and LDH   Visit Diagnosis 1. Encounter for follow-up surveillance of diffuse large B-cell lymphoma      Dr. Randa Evens, MD, MPH [facility] [id number] 11/04/2022 4:21 PM

## 2022-11-04 NOTE — Progress Notes (Signed)
No concerns for the provider. 

## 2022-11-14 ENCOUNTER — Ambulatory Visit
Admission: RE | Admit: 2022-11-14 | Discharge: 2022-11-14 | Disposition: A | Discharge status: Home / Self Care | Payer: Medicare Other | Source: Ambulatory Visit | Attending: Oncology | Admitting: Oncology

## 2022-11-14 DIAGNOSIS — Z08 Encounter for follow-up examination after completed treatment for malignant neoplasm: Secondary | ICD-10-CM | POA: Insufficient documentation

## 2022-11-14 DIAGNOSIS — Z8579 Personal history of other malignant neoplasms of lymphoid, hematopoietic and related tissues: Secondary | ICD-10-CM | POA: Insufficient documentation

## 2022-11-14 MED ORDER — IOHEXOL 300 MG/ML  SOLN
100.0000 mL | Freq: Once | INTRAMUSCULAR | Status: AC | PRN
  Administered 2022-11-14: 100 mL via INTRAVENOUS

## 2022-11-15 ENCOUNTER — Encounter: Payer: Self-pay | Admitting: Oncology

## 2022-12-16 ENCOUNTER — Other Ambulatory Visit: Payer: Self-pay | Admitting: Neurosurgery

## 2022-12-16 DIAGNOSIS — S129XXS Fracture of neck, unspecified, sequela: Secondary | ICD-10-CM

## 2022-12-16 DIAGNOSIS — M4316 Spondylolisthesis, lumbar region: Secondary | ICD-10-CM

## 2022-12-26 ENCOUNTER — Ambulatory Visit
Admission: RE | Admit: 2022-12-26 | Discharge: 2022-12-26 | Disposition: A | Discharge status: Home / Self Care | Payer: Medicare Other | Source: Ambulatory Visit | Attending: Neurosurgery | Admitting: Neurosurgery

## 2022-12-26 DIAGNOSIS — M4316 Spondylolisthesis, lumbar region: Secondary | ICD-10-CM

## 2022-12-26 DIAGNOSIS — S129XXS Fracture of neck, unspecified, sequela: Secondary | ICD-10-CM

## 2022-12-26 MED ORDER — IOPAMIDOL (ISOVUE-M 300) INJECTION 61%
10.0000 mL | Freq: Once | INTRAMUSCULAR | Status: AC
  Administered 2022-12-26: 10 mL via INTRATHECAL

## 2022-12-26 MED ORDER — MEPERIDINE HCL 50 MG/ML IJ SOLN: 50.0000 mg | Freq: Once | INTRAMUSCULAR | Status: DC | PRN

## 2022-12-26 MED ORDER — ONDANSETRON HCL 4 MG/2ML IJ SOLN: 4.0000 mg | Freq: Once | INTRAMUSCULAR | Status: DC | PRN

## 2022-12-26 MED ORDER — DIAZEPAM 5 MG PO TABS
5.0000 mg | ORAL_TABLET | Freq: Once | ORAL | Status: AC
  Administered 2022-12-26: 5 mg via ORAL

## 2022-12-26 NOTE — Discharge Instructions (Signed)

## 2023-01-22 ENCOUNTER — Other Ambulatory Visit: Payer: Self-pay | Admitting: Neurosurgery

## 2023-01-29 NOTE — Pre-Procedure Instructions (Signed)
Surgical Instructions   Your procedure is scheduled on Wednesday, July 3rd. Report to [facility] Main Entrance "A" at 06:30 A.M., then check in with the Admitting office. Any questions or running late day of surgery: call [phone]  Questions prior to your surgery date: call [phone], Monday-Friday, 8am-4pm. If you experience any cold or flu symptoms such as cough, fever, chills, shortness of breath, etc. between now and your scheduled surgery, please notify us at the above number.     Remember:  Do not eat or drink after midnight the night before your surgery     Take these medicines the morning of surgery with A SIP OF WATER  ALPRAZolam (XANAX)  buPROPion (WELLBUTRIN XL)  escitalopram (LEXAPRO)  loratadine (CLARITIN)    May take these medicines IF NEEDED: oxyCODONE-acetaminophen (PERCOCET)    Follow your surgeon's instructions on when to stop Coumadin.  If no instructions were given by your surgeon then you will need to call the office to get those instructions.     One week prior to surgery, STOP taking any Aspirin (unless otherwise instructed by your surgeon) Aleve, Naproxen, Ibuprofen, Motrin, Advil, Goody's, BC's, etodolac (LODINE), all herbal medications, fish oil, diclofenac Sodium (VOLTAREN) gel and non-prescription vitamins.  WHAT DO I DO ABOUT MY DIABETES MEDICATION?   Do not take glipiZIDE (GLUCOTROL XL) the morning of surgery.     HOW TO MANAGE YOUR DIABETES BEFORE AND AFTER SURGERY  Why is it important to control my blood sugar before and after surgery? Improving blood sugar levels before and after surgery helps healing and can limit problems. A way of improving blood sugar control is eating a healthy diet by:  Eating less sugar and carbohydrates  Increasing activity/exercise  Talking with your doctor about reaching your blood sugar goals High blood sugars (greater than 180 mg/dL) can raise your risk of infections and slow your recovery, so you will  need to focus on controlling your diabetes during the weeks before surgery. Make sure that the doctor who takes care of your diabetes knows about your planned surgery including the date and location.  How do I manage my blood sugar before surgery? Check your blood sugar at least 4 times a day, starting 2 days before surgery, to make sure that the level is not too high or low.  Check your blood sugar the morning of your surgery when you wake up and every 2 hours until you get to the Short Stay unit.  If your blood sugar is less than 70 mg/dL, you will need to treat for low blood sugar: Do not take insulin. Treat a low blood sugar (less than 70 mg/dL) with  cup of clear juice (cranberry or apple), 4 glucose tablets, OR glucose gel. Recheck blood sugar in 15 minutes after treatment (to make sure it is greater than 70 mg/dL). If your blood sugar is not greater than 70 mg/dL on recheck, call [phone] for further instructions. Report your blood sugar to the short stay nurse when you get to Short Stay.  If you are admitted to the hospital after surgery: Your blood sugar will be checked by the staff and you will probably be given insulin after surgery (instead of oral diabetes medicines) to make sure you have good blood sugar levels. The goal for blood sugar control after surgery is 80-180 mg/dL.                     Do NOT Smoke (Tobacco/Vaping) for 24 hours prior  to your procedure.  If you use a CPAP at night, you may bring your mask/headgear for your overnight stay.   You will be asked to remove any contacts, glasses, piercing's, hearing aid's, dentures/partials prior to surgery. Please bring cases for these items if needed.    Patients discharged the day of surgery will not be allowed to drive home, and someone needs to stay with them for 24 hours.  SURGICAL WAITING ROOM VISITATION Patients may have no more than 2 support people in the waiting area - these visitors may rotate.   Pre-op  nurse will coordinate an appropriate time for 1 ADULT support person, who may not rotate, to accompany patient in pre-op.  Children under the age of 84 must have an adult with them who is not the patient and must remain in the main waiting area with an adult.  If the patient needs to stay at the hospital during part of their recovery, the visitor guidelines for inpatient rooms apply.  Please refer to the [facility] website for the visitor guidelines for any additional information.   If you received a COVID test during your pre-op visit  it is requested that you wear a mask when out in public, stay away from anyone that may not be feeling well and notify your surgeon if you develop symptoms. If you have been in contact with anyone that has tested positive in the last 10 days please notify you surgeon.      Pre-operative 5 CHG Bath Instructions   You can play a key role in reducing the risk of infection after surgery. Your skin needs to be as free of germs as possible. You can reduce the number of germs on your skin by washing with CHG (chlorhexidine gluconate) soap before surgery. CHG is an antiseptic soap that kills germs and continues to kill germs even after washing.   DO NOT use if you have an allergy to chlorhexidine/CHG or antibacterial soaps. If your skin becomes reddened or irritated, stop using the CHG and notify one of our RNs at [phone].   Please shower with the CHG soap starting 4 days before surgery using the following schedule:     Please keep in mind the following:  DO NOT shave, including legs and underarms, starting the day of your first shower.   You may shave your face at any point before/day of surgery.  Place clean sheets on your bed the day you start using CHG soap. Use a clean washcloth (not used since being washed) for each shower. DO NOT sleep with pets once you start using the CHG.   CHG Shower Instructions:  If you choose to wash your hair and private  area, wash first with your normal shampoo/soap.  After you use shampoo/soap, rinse your hair and body thoroughly to remove shampoo/soap residue.  Turn the water OFF and apply about 3 tablespoons (45 ml) of CHG soap to a CLEAN washcloth.  Apply CHG soap ONLY FROM YOUR NECK DOWN TO YOUR TOES (washing for 3-5 minutes)  DO NOT use CHG soap on face, private areas, open wounds, or sores.  Pay special attention to the area where your surgery is being performed.  If you are having back surgery, having someone wash your back for you may be helpful. Wait 2 minutes after CHG soap is applied, then you may rinse off the CHG soap.  Pat dry with a clean towel  Put on clean clothes/pajamas   If you choose to wear  lotion, please use ONLY the CHG-compatible lotions on the back of this paper.   Additional instructions for the day of surgery: DO NOT APPLY any lotions, deodorants, cologne, or perfumes.   Do not bring valuables to the hospital. [facility] is not responsible for any belongings/valuables. Do not wear nail polish, gel polish, artificial nails, or any other type of covering on natural nails (fingers and toes) Do not wear jewelry or makeup Put on clean/comfortable clothes.  Please brush your teeth.  Ask your nurse before applying any prescription medications to the skin.     CHG Compatible Lotions   Aveeno Moisturizing lotion  Cetaphil Moisturizing Cream  Cetaphil Moisturizing Lotion  Clairol Herbal Essence Moisturizing Lotion, Dry Skin  Clairol Herbal Essence Moisturizing Lotion, Extra Dry Skin  Clairol Herbal Essence Moisturizing Lotion, Normal Skin  Curel Age Defying Therapeutic Moisturizing Lotion with Alpha Hydroxy  Curel Extreme Care Body Lotion  Curel Soothing Hands Moisturizing Hand Lotion  Curel Therapeutic Moisturizing Cream, Fragrance-Free  Curel Therapeutic Moisturizing Lotion, Fragrance-Free  Curel Therapeutic Moisturizing Lotion, Original Formula  Eucerin Daily Replenishing  Lotion  Eucerin Dry Skin Therapy Plus Alpha Hydroxy Crme  Eucerin Dry Skin Therapy Plus Alpha Hydroxy Lotion  Eucerin Original Crme  Eucerin Original Lotion  Eucerin Plus Crme Eucerin Plus Lotion  Eucerin TriLipid Replenishing Lotion  Keri Anti-Bacterial Hand Lotion  Keri Deep Conditioning Original Lotion Dry Skin Formula Softly Scented  Keri Deep Conditioning Original Lotion, Fragrance Free Sensitive Skin Formula  Keri Lotion Fast Absorbing Fragrance Free Sensitive Skin Formula  Keri Lotion Fast Absorbing Softly Scented Dry Skin Formula  Keri Original Lotion  Keri Skin Renewal Lotion Keri Silky Smooth Lotion  Keri Silky Smooth Sensitive Skin Lotion  Nivea Body Creamy Conditioning Oil  Nivea Body Extra Enriched Lotion  Nivea Body Original Lotion  Nivea Body Sheer Moisturizing Lotion Nivea Crme  Nivea Skin Firming Lotion  NutraDerm 30 Skin Lotion  NutraDerm Skin Lotion  NutraDerm Therapeutic Skin Cream  NutraDerm Therapeutic Skin Lotion  ProShield Protective Hand Cream  Provon moisturizing lotion  Please read over the following fact sheets that you were given.

## 2023-01-30 ENCOUNTER — Encounter (HOSPITAL_COMMUNITY): Payer: Self-pay

## 2023-01-30 ENCOUNTER — Encounter (HOSPITAL_COMMUNITY)
Admission: RE | Admit: 2023-01-30 | Discharge: 2023-01-30 | Disposition: A | Discharge status: Home / Self Care | Payer: Medicare Other | Source: Ambulatory Visit | Attending: Neurosurgery | Admitting: Neurosurgery

## 2023-01-30 ENCOUNTER — Other Ambulatory Visit: Payer: Self-pay

## 2023-01-30 VITALS — BP 125/81 | HR 81 | Temp 98.1°F | Resp 18 | Ht 74.0 in | Wt 227.8 lb

## 2023-01-30 DIAGNOSIS — Z7901 Long term (current) use of anticoagulants: Secondary | ICD-10-CM | POA: Insufficient documentation

## 2023-01-30 DIAGNOSIS — E119 Type 2 diabetes mellitus without complications: Secondary | ICD-10-CM | POA: Insufficient documentation

## 2023-01-30 DIAGNOSIS — G4730 Sleep apnea, unspecified: Secondary | ICD-10-CM | POA: Diagnosis not present

## 2023-01-30 DIAGNOSIS — I129 Hypertensive chronic kidney disease with stage 1 through stage 4 chronic kidney disease, or unspecified chronic kidney disease: Secondary | ICD-10-CM | POA: Diagnosis not present

## 2023-01-30 DIAGNOSIS — M549 Dorsalgia, unspecified: Secondary | ICD-10-CM | POA: Insufficient documentation

## 2023-01-30 DIAGNOSIS — N189 Chronic kidney disease, unspecified: Secondary | ICD-10-CM | POA: Diagnosis not present

## 2023-01-30 DIAGNOSIS — Z01812 Encounter for preprocedural laboratory examination: Secondary | ICD-10-CM | POA: Diagnosis present

## 2023-01-30 DIAGNOSIS — Z87891 Personal history of nicotine dependence: Secondary | ICD-10-CM | POA: Insufficient documentation

## 2023-01-30 DIAGNOSIS — Z01818 Encounter for other preprocedural examination: Secondary | ICD-10-CM

## 2023-01-30 DIAGNOSIS — M4316 Spondylolisthesis, lumbar region: Secondary | ICD-10-CM | POA: Diagnosis not present

## 2023-01-30 HISTORY — DX: Umbilical hernia without obstruction or gangrene: K42.9

## 2023-01-30 HISTORY — DX: Unspecified osteoarthritis, unspecified site: M19.90

## 2023-01-30 HISTORY — DX: Headache, unspecified: R51.9

## 2023-01-30 HISTORY — DX: Lupus anticoagulant syndrome: D68.62

## 2023-01-30 LAB — TYPE AND SCREEN
ABO/RH(D): A NEG
Antibody Screen: NEGATIVE

## 2023-01-30 LAB — SURGICAL PCR SCREEN
MRSA, PCR: NEGATIVE
Staphylococcus aureus: NEGATIVE

## 2023-01-30 LAB — GLUCOSE, CAPILLARY: Glucose-Capillary: 145 mg/dL — ABNORMAL HIGH (ref 70–99)

## 2023-01-30 NOTE — Progress Notes (Signed)
PCP - Dr. Bethann Punches Cardiologist - denies  PPM/ICD - denies   Chest x-ray - 06/07/20 EKG - 08/06/22- CE- tracing requested Stress Test - 06/10/18 CE ECHO - 07/26/20 Cardiac Cath - denies  Sleep Study - denies   DM- denies  Blood Thinner Instructions: Pt states he will be going to an appt with Dr. Conchita Paris today at 1130 to find out instructions for the coumadin/lovenox bridge. Obtain PT-INR on day of surgery.  Aspirin Instructions: n/a  ERAS Protcol - no, NPO   COVID TEST- n/a   Anesthesia review: yes, EKG tracing requested. Coumadin/Lovenox bridge  Patient denies shortness of breath, fever, cough and chest pain at PAT appointment   All instructions explained to the patient, with a verbal understanding of the material. Patient agrees to go over the instructions while at home for a better understanding.  The opportunity to ask questions was provided.

## 2023-02-02 ENCOUNTER — Other Ambulatory Visit: Payer: Self-pay | Admitting: Neurosurgery

## 2023-02-03 ENCOUNTER — Encounter (HOSPITAL_COMMUNITY): Payer: Self-pay

## 2023-02-03 NOTE — Anesthesia Preprocedure Evaluation (Addendum)
Anesthesia Evaluation  Patient identified by MRN, date of birth, ID band Patient awake    Reviewed: Allergy & Precautions, H&P , NPO status , Patient's Chart, lab work & pertinent test results  Airway Mallampati: III  TM Distance: <3 FB Neck ROM: Full    Dental no notable dental hx.    Pulmonary sleep apnea , former smoker   Pulmonary exam normal breath sounds clear to auscultation       Cardiovascular hypertension, Normal cardiovascular exam Rhythm:Regular Rate:Normal     Neuro/Psych negative neurological ROS  negative psych ROS   GI/Hepatic negative GI ROS, Neg liver ROS,,,  Endo/Other  diabetes, Type 2    Renal/GU negative Renal ROS  negative genitourinary   Musculoskeletal  (+) Arthritis , Osteoarthritis,    Abdominal   Peds negative pediatric ROS (+)  Hematology negative hematology ROS (+) Lupus anticoagulant disorder   Anesthesia Other Findings   Reproductive/Obstetrics negative OB ROS                             Anesthesia Physical Anesthesia Plan  ASA: 3  Anesthesia Plan: General   Post-op Pain Management: Ofirmev IV (intra-op)*   Induction: Intravenous  PONV Risk Score and Plan: 2 and Ondansetron, Dexamethasone and Treatment may vary due to age or medical condition  Airway Management Planned: Oral ETT  Additional Equipment:   Intra-op Plan:   Post-operative Plan: Extubation in OR  Informed Consent: I have reviewed the patients History and Physical, chart, labs and discussed the procedure including the risks, benefits and alternatives for the proposed anesthesia with the patient or authorized representative who has indicated his/her understanding and acceptance.     Dental advisory given  Plan Discussed with: CRNA and Surgeon  Anesthesia Plan Comments: (See PAT note from 6/21 by Sherlie Ban PA-C )        Anesthesia Quick Evaluation

## 2023-02-03 NOTE — Progress Notes (Signed)
Case: [id number] Date/Time: 02/11/23 0900   Procedure: PLIF,IP,POSTERIOR INSTRUMENTATION L34, L45 - 3C   Anesthesia type: General   Pre-op diagnosis: SPONDYLOLISTHESIS, LUMBAR REGION   Location: [facility] ROOM 21 / [facility] OR   Surgeons: Tressie Stalker, MD       DISCUSSION: Eddie Ryan is a 67 year old male who presents to PAT prior to surgery listed above.  Past medical history significant for former smoking (quit in 1981), antiphospholipid syndrome, lupus, lymphoma (in remission), diabetes, hypertension, sleep apnea, CKD, chronic neck and back pain s/p ACDF from C6-C7  No prior anesthesia complications  Patient follows with his PCP for his chronic medical issues.  Last seen on 01/30/2023.  A Coumadin/Lovenox bridge was recommended due to antiphospholipid syndrome and history of DVT/PE.  Preop evaluation was done and surgical clearance provided that patient is low surgical risk.  Paper copy of clearance in chart.  Patient with history of diabetes and A1c is 7.4.  His creatinine was mildly elevated on recent labs to 1.8 with a baseline of 1.4. His Telmesartan is being held and was encouraged to drink plenty of fluids.  Patient follows with oncology for surveillance of lymphoma.  Completed treatment in September 2022.  Last seen on 11/04/2022.  A CT chest abdomen pelvis was ordered and completed on 4/5 which was normal.  VS: BP 125/81   Pulse 81   Temp 36.7 C (Oral)   Resp 18   Ht 6\' 2"  (1.88 m)   Wt 103.3 kg   SpO2 97%   BMI 29.25 kg/m   PROVIDERS: Danella Penton, MD Oncology: Owens Shark, MD  LABS: Labs reviewed: Acceptable for surgery. (all labs ordered are listed, but only abnormal results are displayed)  Labs Reviewed  GLUCOSE, CAPILLARY - Abnormal; Notable for the following components:      Result Value   Glucose-Capillary 145 (*)    All other components within normal limits  SURGICAL PCR SCREEN  TYPE AND SCREEN     IMAGES:  CT cervical and lumbar spine  12/26/22:  IMPRESSION: CERVICAL SPINE:   1. C5-C7 ACDF with solid arthrodesis at C6-7. 2. Pseudoarthrosis at C5-6 with back out of the C5 screws. 3. Mild spinal stenosis and severe bilateral neural foraminal stenosis at C3-4. 4. Severe left neural foraminal stenosis at C4-5. 5. Moderate neural foraminal stenosis on the left at C5-6 and on the right at C7-T1.   LUMBAR SPINE:   1. Progressive disc and facet degeneration at L3-4 with moderate spinal stenosis and moderate to severe right and moderate left lateral recess and neural foraminal stenosis. 2. Unchanged moderate bilateral lateral recess stenosis and severe neural foraminal stenosis at L4-5. 3. Enlargement of a left paracentral to left foraminal disc protrusion at L1-2 without stenosis. 4.  Aortic Atherosclerosis (ICD10-I70.0).  CT chest/abdomen/pelvis 11/14/22:  IMPRESSION: 1. No lymphadenopathy in the chest, abdomen or pelvis. 2. Small area of nodular tissue adjacent to the proximal right ureter, decreased in size when compared with the prior exam, consistent with treated disease. 3. Aortic Atherosclerosis (ICD10-I70.0).   EKG 08/06/22:  NSR, rate 61   CV:  Echo 07/26/2020:  IMPRESSIONS     1. Left ventricular ejection fraction, by estimation, is 60 to 65%. The  left ventricle has normal function. The left ventricle has no regional  wall motion abnormalities. Left ventricular diastolic parameters are  consistent with Grade I diastolic  dysfunction (impaired relaxation). The average left ventricular global  longitudinal strain is -16.8 %. The global longitudinal strain is normal.  2. Right ventricular systolic function is normal. The right ventricular  size is normal.    Past Medical History:  Diagnosis Date   Anxiety    Arthritis    Collagen vascular disease (HCC)    Depression    Diabetes mellitus without complication (HCC)    DVT (deep venous thrombosis) (HCC)    Headache    occasionally    History of kidney stones    Hypertension    Lupus anticoagulant disorder (HCC)    Lymphoma (HCC) 07/2020   Sleep apnea    Umbilical hernia     Past Surgical History:  Procedure Laterality Date   CERVICAL FUSION     CYSTOSCOPY W/ RETROGRADES Left 04/26/2020   Procedure: CYSTOSCOPY WITH RETROGRADE PYELOGRAM;  Surgeon: Vanna Scotland, MD;  Location: [facility] ORS;  Service: Urology;  Laterality: Left;   CYSTOSCOPY WITH URETEROSCOPY AND STENT PLACEMENT Right 07/02/2020   Procedure: CYSTOSCOPY WITH URETEROSCOPY AND STENT PLACEMENT;  Surgeon: Vanna Scotland, MD;  Location: [facility] ORS;  Service: Urology;  Laterality: Right;   CYSTOSCOPY/URETEROSCOPY/HOLMIUM LASER/STENT PLACEMENT Right 04/26/2020   Procedure: CYSTOSCOPY/URETEROSCOPY/HOLMIUM LASER/STENT PLACEMENT;  Surgeon: Vanna Scotland, MD;  Location: [facility] ORS;  Service: Urology;  Laterality: Right;   IR IMAGING GUIDED PORT INSERTION  07/27/2020   IR REMOVAL TUN ACCESS W/ PORT W/O FL MOD SED  02/08/2021   PLANTAR FASCIA SURGERY Bilateral    ROTATOR CUFF REPAIR Left    THUMB FUSION Right    URETERAL BIOPSY Right 04/26/2020   Procedure: URETERAL BIOPSY with ablation;  Surgeon: Vanna Scotland, MD;  Location: [facility] ORS;  Service: Urology;  Laterality: Right;   URETEROSCOPY WITH HOLMIUM LASER LITHOTRIPSY      MEDICATIONS:  ALPRAZolam (XANAX) 0.5 MG tablet   buPROPion (WELLBUTRIN XL) 150 MG 24 hr tablet   diclofenac Sodium (VOLTAREN) 1 % GEL   docusate sodium (COLACE) 100 MG capsule   ergocalciferol (VITAMIN D2) 1.25 MG (50000 UT) capsule   escitalopram (LEXAPRO) 20 MG tablet   etodolac (LODINE) 400 MG tablet   glipiZIDE (GLUCOTROL XL) 10 MG 24 hr tablet   loratadine (CLARITIN) 10 MG tablet   Magnesium 500 MG TABS   oxyCODONE-acetaminophen (PERCOCET) 5-325 MG tablet   rosuvastatin (CRESTOR) 10 MG tablet   telmisartan (MICARDIS) 80 MG tablet   warfarin (COUMADIN) 5 MG tablet   No current facility-administered medications for this encounter.

## 2023-02-10 NOTE — Progress Notes (Signed)
LVM x2 with surgery time change for 02/10/23, 1100-1600, arrival 0900.

## 2023-02-11 ENCOUNTER — Ambulatory Visit (HOSPITAL_COMMUNITY): Payer: Medicare Other | Admitting: Physician Assistant

## 2023-02-11 ENCOUNTER — Inpatient Hospital Stay (HOSPITAL_COMMUNITY)
Admission: RE | Admit: 2023-02-11 | Discharge: 2023-02-18 | DRG: 454 | Disposition: A | Discharge status: Home Health | Payer: Medicare Other | Attending: Neurosurgery | Admitting: Neurosurgery

## 2023-02-11 ENCOUNTER — Encounter (HOSPITAL_COMMUNITY): Payer: Self-pay | Admitting: Neurosurgery

## 2023-02-11 ENCOUNTER — Ambulatory Visit (HOSPITAL_BASED_OUTPATIENT_CLINIC_OR_DEPARTMENT_OTHER): Payer: Medicare Other

## 2023-02-11 ENCOUNTER — Ambulatory Visit (HOSPITAL_COMMUNITY)
Admission: RE | Disposition: A | Discharge status: Home Health | Payer: Self-pay | Source: Home / Self Care | Attending: Neurosurgery

## 2023-02-11 ENCOUNTER — Ambulatory Visit (HOSPITAL_COMMUNITY): Payer: Medicare Other

## 2023-02-11 ENCOUNTER — Other Ambulatory Visit: Payer: Self-pay

## 2023-02-11 DIAGNOSIS — M5116 Intervertebral disc disorders with radiculopathy, lumbar region: Secondary | ICD-10-CM | POA: Diagnosis present

## 2023-02-11 DIAGNOSIS — I10 Essential (primary) hypertension: Secondary | ICD-10-CM | POA: Diagnosis present

## 2023-02-11 DIAGNOSIS — R339 Retention of urine, unspecified: Secondary | ICD-10-CM | POA: Diagnosis not present

## 2023-02-11 DIAGNOSIS — M4726 Other spondylosis with radiculopathy, lumbar region: Secondary | ICD-10-CM | POA: Diagnosis present

## 2023-02-11 DIAGNOSIS — Z806 Family history of leukemia: Secondary | ICD-10-CM

## 2023-02-11 DIAGNOSIS — Z7984 Long term (current) use of oral hypoglycemic drugs: Secondary | ICD-10-CM

## 2023-02-11 DIAGNOSIS — Z885 Allergy status to narcotic agent status: Secondary | ICD-10-CM

## 2023-02-11 DIAGNOSIS — Z87891 Personal history of nicotine dependence: Secondary | ICD-10-CM

## 2023-02-11 DIAGNOSIS — M48062 Spinal stenosis, lumbar region with neurogenic claudication: Secondary | ICD-10-CM | POA: Diagnosis not present

## 2023-02-11 DIAGNOSIS — Z8572 Personal history of non-Hodgkin lymphomas: Secondary | ICD-10-CM

## 2023-02-11 DIAGNOSIS — Z79899 Other long term (current) drug therapy: Secondary | ICD-10-CM

## 2023-02-11 DIAGNOSIS — E119 Type 2 diabetes mellitus without complications: Secondary | ICD-10-CM | POA: Diagnosis present

## 2023-02-11 DIAGNOSIS — M4316 Spondylolisthesis, lumbar region: Secondary | ICD-10-CM | POA: Diagnosis not present

## 2023-02-11 DIAGNOSIS — Z86718 Personal history of other venous thrombosis and embolism: Secondary | ICD-10-CM

## 2023-02-11 DIAGNOSIS — Z7409 Other reduced mobility: Secondary | ICD-10-CM | POA: Diagnosis present

## 2023-02-11 DIAGNOSIS — Z800 Family history of malignant neoplasm of digestive organs: Secondary | ICD-10-CM

## 2023-02-11 DIAGNOSIS — F419 Anxiety disorder, unspecified: Secondary | ICD-10-CM | POA: Diagnosis present

## 2023-02-11 DIAGNOSIS — Z8042 Family history of malignant neoplasm of prostate: Secondary | ICD-10-CM

## 2023-02-11 DIAGNOSIS — Z751 Person awaiting admission to adequate facility elsewhere: Secondary | ICD-10-CM

## 2023-02-11 DIAGNOSIS — Z7901 Long term (current) use of anticoagulants: Secondary | ICD-10-CM

## 2023-02-11 DIAGNOSIS — D6862 Lupus anticoagulant syndrome: Secondary | ICD-10-CM | POA: Diagnosis present

## 2023-02-11 LAB — GLUCOSE, CAPILLARY
Glucose-Capillary: 150 mg/dL — ABNORMAL HIGH (ref 70–99)
Glucose-Capillary: 136 mg/dL — ABNORMAL HIGH (ref 70–99)
Glucose-Capillary: 186 mg/dL — ABNORMAL HIGH (ref 70–99)
Glucose-Capillary: 199 mg/dL — ABNORMAL HIGH (ref 70–99)
Glucose-Capillary: 210 mg/dL — ABNORMAL HIGH (ref 70–99)

## 2023-02-11 LAB — APTT: aPTT: 28 seconds (ref 24–36)

## 2023-02-11 LAB — PROTIME-INR
INR: 1 (ref 0.8–1.2)
Prothrombin Time: 13.5 seconds (ref 11.4–15.2)

## 2023-02-11 LAB — HEMOGLOBIN A1C
Hgb A1c MFr Bld: 7.3 % — ABNORMAL HIGH (ref 4.8–5.6)
Mean Plasma Glucose: 162.81 mg/dL

## 2023-02-11 SURGERY — POSTERIOR LUMBAR FUSION 2 LEVEL
Anesthesia: General | Site: Back

## 2023-02-11 MED ORDER — OXYCODONE HCL 5 MG/5ML PO SOLN: 5.0000 mg | Freq: Once | ORAL | Status: AC | PRN

## 2023-02-11 MED ORDER — CHLORHEXIDINE GLUCONATE 0.12 % MT SOLN
OROMUCOSAL | Status: AC
  Administered 2023-02-11: 15 mL via OROMUCOSAL
  Filled 2023-02-11: qty 15

## 2023-02-11 MED ORDER — OXYCODONE-ACETAMINOPHEN 5-325 MG PO TABS: 1.0000 | ORAL_TABLET | ORAL | Status: DC | PRN

## 2023-02-11 MED ORDER — ONDANSETRON HCL 4 MG/2ML IJ SOLN: 4.0000 mg | Freq: Four times a day (QID) | INTRAMUSCULAR | Status: DC | PRN

## 2023-02-11 MED ORDER — ACETAMINOPHEN 10 MG/ML IV SOLN
INTRAVENOUS | Status: DC | PRN
  Administered 2023-02-11: 1000 mg via INTRAVENOUS

## 2023-02-11 MED ORDER — THROMBIN 5000 UNITS EX SOLR: OROMUCOSAL | Status: DC | PRN

## 2023-02-11 MED ORDER — SURGIPHOR WOUND IRRIGATION SYSTEM - OPTIME: TOPICAL | Status: DC | PRN

## 2023-02-11 MED ORDER — BUPIVACAINE LIPOSOME 1.3 % IJ SUSP
INTRAMUSCULAR | Status: DC | PRN
  Administered 2023-02-11: 20 mL

## 2023-02-11 MED ORDER — IRBESARTAN 300 MG PO TABS
300.0000 mg | ORAL_TABLET | Freq: Every day | ORAL | Status: DC
  Administered 2023-02-12 – 2023-02-18 (×5): 300 mg via ORAL
  Filled 2023-02-11 (×7): qty 1

## 2023-02-11 MED ORDER — OXYCODONE HCL 5 MG PO TABS
10.0000 mg | ORAL_TABLET | ORAL | Status: DC | PRN
  Administered 2023-02-11 – 2023-02-18 (×24): 10 mg via ORAL
  Filled 2023-02-11 (×25): qty 2

## 2023-02-11 MED ORDER — ACETAMINOPHEN 10 MG/ML IV SOLN
INTRAVENOUS | Status: AC
  Filled 2023-02-11: qty 100

## 2023-02-11 MED ORDER — ROCURONIUM BROMIDE 10 MG/ML (PF) SYRINGE
PREFILLED_SYRINGE | INTRAVENOUS | Status: DC | PRN
  Administered 2023-02-11 (×2): 10 mg via INTRAVENOUS
  Administered 2023-02-11: 30 mg via INTRAVENOUS
  Administered 2023-02-11: 10 mg via INTRAVENOUS
  Administered 2023-02-11: 70 mg via INTRAVENOUS
  Administered 2023-02-11: 30 mg via INTRAVENOUS

## 2023-02-11 MED ORDER — DOCUSATE SODIUM 100 MG PO CAPS
100.0000 mg | ORAL_CAPSULE | Freq: Two times a day (BID) | ORAL | Status: DC
  Administered 2023-02-11 – 2023-02-18 (×14): 100 mg via ORAL
  Filled 2023-02-11 (×14): qty 1

## 2023-02-11 MED ORDER — LACTATED RINGERS IV SOLN: INTRAVENOUS | Status: DC

## 2023-02-11 MED ORDER — OXYCODONE HCL 5 MG PO TABS
5.0000 mg | ORAL_TABLET | ORAL | Status: DC | PRN
  Administered 2023-02-12: 5 mg via ORAL

## 2023-02-11 MED ORDER — CEFAZOLIN SODIUM-DEXTROSE 2-4 GM/100ML-% IV SOLN
2.0000 g | INTRAVENOUS | Status: AC
  Administered 2023-02-11: 2 g via INTRAVENOUS

## 2023-02-11 MED ORDER — ROCURONIUM BROMIDE 10 MG/ML (PF) SYRINGE
PREFILLED_SYRINGE | INTRAVENOUS | Status: AC
  Filled 2023-02-11: qty 10

## 2023-02-11 MED ORDER — MORPHINE SULFATE (PF) 4 MG/ML IV SOLN
4.0000 mg | INTRAVENOUS | Status: DC | PRN
  Administered 2023-02-11 – 2023-02-16 (×14): 4 mg via INTRAVENOUS
  Filled 2023-02-11 (×14): qty 1

## 2023-02-11 MED ORDER — BUPIVACAINE-EPINEPHRINE (PF) 0.25% -1:200000 IJ SOLN
INTRAMUSCULAR | Status: AC
  Filled 2023-02-11: qty 30

## 2023-02-11 MED ORDER — OXYCODONE HCL 5 MG PO TABS
ORAL_TABLET | ORAL | Status: AC
  Administered 2023-02-11: 5 mg via ORAL
  Filled 2023-02-11: qty 1

## 2023-02-11 MED ORDER — DEXAMETHASONE SODIUM PHOSPHATE 10 MG/ML IJ SOLN
INTRAMUSCULAR | Status: DC | PRN
  Administered 2023-02-11: 5 mg via INTRAVENOUS

## 2023-02-11 MED ORDER — CHLORHEXIDINE GLUCONATE CLOTH 2 % EX PADS: 6.0000 | MEDICATED_PAD | Freq: Once | CUTANEOUS | Status: DC

## 2023-02-11 MED ORDER — INSULIN ASPART 100 UNIT/ML IJ SOLN
0.0000 [IU] | INTRAMUSCULAR | Status: DC
  Administered 2023-02-11: 7 [IU] via SUBCUTANEOUS
  Administered 2023-02-12 (×2): 3 [IU] via SUBCUTANEOUS
  Administered 2023-02-12 (×2): 4 [IU] via SUBCUTANEOUS
  Administered 2023-02-12: 7 [IU] via SUBCUTANEOUS
  Administered 2023-02-13 – 2023-02-14 (×6): 4 [IU] via SUBCUTANEOUS
  Administered 2023-02-14 – 2023-02-15 (×4): 3 [IU] via SUBCUTANEOUS
  Administered 2023-02-16: 4 [IU] via SUBCUTANEOUS
  Administered 2023-02-17 – 2023-02-18 (×4): 3 [IU] via SUBCUTANEOUS

## 2023-02-11 MED ORDER — BACITRACIN ZINC 500 UNIT/GM EX OINT
TOPICAL_OINTMENT | CUTANEOUS | Status: DC | PRN
  Administered 2023-02-11: 1 via TOPICAL

## 2023-02-11 MED ORDER — ONDANSETRON HCL 4 MG PO TABS
4.0000 mg | ORAL_TABLET | Freq: Four times a day (QID) | ORAL | Status: DC | PRN
  Administered 2023-02-12: 4 mg via ORAL
  Filled 2023-02-11: qty 1

## 2023-02-11 MED ORDER — SODIUM CHLORIDE 0.9% FLUSH
3.0000 mL | Freq: Two times a day (BID) | INTRAVENOUS | Status: DC
  Administered 2023-02-11 – 2023-02-18 (×14): 3 mL via INTRAVENOUS

## 2023-02-11 MED ORDER — GLIPIZIDE ER 10 MG PO TB24
10.0000 mg | ORAL_TABLET | Freq: Every day | ORAL | Status: DC
  Administered 2023-02-12 – 2023-02-18 (×7): 10 mg via ORAL
  Filled 2023-02-11 (×8): qty 1

## 2023-02-11 MED ORDER — CYCLOBENZAPRINE HCL 10 MG PO TABS
10.0000 mg | ORAL_TABLET | Freq: Three times a day (TID) | ORAL | Status: DC | PRN
  Administered 2023-02-14 – 2023-02-17 (×6): 10 mg via ORAL
  Filled 2023-02-11 (×8): qty 1

## 2023-02-11 MED ORDER — THROMBIN 5000 UNITS EX SOLR
CUTANEOUS | Status: AC
  Filled 2023-02-11: qty 5000

## 2023-02-11 MED ORDER — BUPIVACAINE LIPOSOME 1.3 % IJ SUSP
INTRAMUSCULAR | Status: AC
  Filled 2023-02-11: qty 20

## 2023-02-11 MED ORDER — SODIUM CHLORIDE 0.9% FLUSH: 3.0000 mL | INTRAVENOUS | Status: DC | PRN

## 2023-02-11 MED ORDER — SUGAMMADEX SODIUM 200 MG/2ML IV SOLN
INTRAVENOUS | Status: DC | PRN
  Administered 2023-02-11: 200 mg via INTRAVENOUS

## 2023-02-11 MED ORDER — ONDANSETRON HCL 4 MG/2ML IJ SOLN
INTRAMUSCULAR | Status: DC | PRN
  Administered 2023-02-11: 4 mg via INTRAVENOUS

## 2023-02-11 MED ORDER — CHLORHEXIDINE GLUCONATE 0.12 % MT SOLN: 15.0000 mL | Freq: Once | OROMUCOSAL | Status: AC

## 2023-02-11 MED ORDER — PHENYLEPHRINE HCL-NACL 20-0.9 MG/250ML-% IV SOLN
INTRAVENOUS | Status: DC | PRN
  Administered 2023-02-11: 25 ug/min via INTRAVENOUS

## 2023-02-11 MED ORDER — SODIUM CHLORIDE 0.9 % IV SOLN
250.0000 mL | INTRAVENOUS | Status: DC
  Administered 2023-02-11: 250 mL via INTRAVENOUS

## 2023-02-11 MED ORDER — PROPOFOL 10 MG/ML IV BOLUS
INTRAVENOUS | Status: AC
  Filled 2023-02-11: qty 20

## 2023-02-11 MED ORDER — CYCLOBENZAPRINE HCL 10 MG PO TABS
ORAL_TABLET | ORAL | Status: AC
  Administered 2023-02-11: 10 mg via ORAL
  Filled 2023-02-11: qty 1

## 2023-02-11 MED ORDER — FENTANYL CITRATE (PF) 250 MCG/5ML IJ SOLN
INTRAMUSCULAR | Status: AC
  Filled 2023-02-11: qty 5

## 2023-02-11 MED ORDER — BUPIVACAINE-EPINEPHRINE (PF) 0.25% -1:200000 IJ SOLN
INTRAMUSCULAR | Status: DC | PRN
  Administered 2023-02-11: 10 mL

## 2023-02-11 MED ORDER — BUPROPION HCL ER (XL) 300 MG PO TB24
300.0000 mg | ORAL_TABLET | Freq: Every day | ORAL | Status: DC
  Administered 2023-02-12 – 2023-02-18 (×7): 300 mg via ORAL
  Filled 2023-02-11 (×8): qty 1

## 2023-02-11 MED ORDER — ORAL CARE MOUTH RINSE: 15.0000 mL | Freq: Once | OROMUCOSAL | Status: AC

## 2023-02-11 MED ORDER — ESCITALOPRAM OXALATE 20 MG PO TABS
20.0000 mg | ORAL_TABLET | Freq: Every day | ORAL | Status: DC
  Administered 2023-02-12 – 2023-02-18 (×7): 20 mg via ORAL
  Filled 2023-02-11 (×3): qty 1
  Filled 2023-02-11: qty 2
  Filled 2023-02-11: qty 1
  Filled 2023-02-11: qty 2
  Filled 2023-02-11: qty 1
  Filled 2023-02-11 (×5): qty 2
  Filled 2023-02-11 (×2): qty 1

## 2023-02-11 MED ORDER — OXYCODONE HCL 5 MG PO TABS: 5.0000 mg | ORAL_TABLET | Freq: Once | ORAL | Status: AC | PRN

## 2023-02-11 MED ORDER — BISACODYL 10 MG RE SUPP
10.0000 mg | Freq: Every day | RECTAL | Status: DC | PRN
  Administered 2023-02-14: 10 mg via RECTAL
  Filled 2023-02-11: qty 1

## 2023-02-11 MED ORDER — ALPRAZOLAM 0.5 MG PO TABS
0.5000 mg | ORAL_TABLET | Freq: Two times a day (BID) | ORAL | Status: DC | PRN
  Administered 2023-02-13 – 2023-02-16 (×4): 0.5 mg via ORAL
  Filled 2023-02-11 (×4): qty 1

## 2023-02-11 MED ORDER — ONDANSETRON HCL 4 MG/2ML IJ SOLN
INTRAMUSCULAR | Status: AC
  Filled 2023-02-11: qty 2

## 2023-02-11 MED ORDER — ROSUVASTATIN CALCIUM 5 MG PO TABS
10.0000 mg | ORAL_TABLET | Freq: Every day | ORAL | Status: DC
  Administered 2023-02-11 – 2023-02-17 (×7): 10 mg via ORAL
  Filled 2023-02-11 (×7): qty 2

## 2023-02-11 MED ORDER — CEFAZOLIN SODIUM-DEXTROSE 2-4 GM/100ML-% IV SOLN
2.0000 g | Freq: Three times a day (TID) | INTRAVENOUS | Status: AC
  Administered 2023-02-11 – 2023-02-12 (×2): 2 g via INTRAVENOUS
  Filled 2023-02-11 (×2): qty 100

## 2023-02-11 MED ORDER — FENTANYL CITRATE (PF) 250 MCG/5ML IJ SOLN
INTRAMUSCULAR | Status: DC | PRN
  Administered 2023-02-11: 25 ug via INTRAVENOUS
  Administered 2023-02-11 (×2): 50 ug via INTRAVENOUS
  Administered 2023-02-11 (×3): 25 ug via INTRAVENOUS
  Administered 2023-02-11: 50 ug via INTRAVENOUS

## 2023-02-11 MED ORDER — PHENOL 1.4 % MT LIQD: 1.0000 | OROMUCOSAL | Status: DC | PRN

## 2023-02-11 MED ORDER — 0.9 % SODIUM CHLORIDE (POUR BTL) OPTIME
TOPICAL | Status: DC | PRN
  Administered 2023-02-11: 1000 mL

## 2023-02-11 MED ORDER — HYDROMORPHONE HCL 1 MG/ML IJ SOLN
INTRAMUSCULAR | Status: AC
  Administered 2023-02-11: 0.5 mg via INTRAVENOUS
  Filled 2023-02-11: qty 1

## 2023-02-11 MED ORDER — CEFAZOLIN SODIUM-DEXTROSE 2-4 GM/100ML-% IV SOLN
INTRAVENOUS | Status: AC
  Filled 2023-02-11: qty 100

## 2023-02-11 MED ORDER — PROPOFOL 10 MG/ML IV BOLUS
INTRAVENOUS | Status: DC | PRN
  Administered 2023-02-11: 150 mg via INTRAVENOUS

## 2023-02-11 MED ORDER — HYDROMORPHONE HCL 1 MG/ML IJ SOLN
0.2500 mg | INTRAMUSCULAR | Status: DC | PRN
  Administered 2023-02-11: 0.5 mg via INTRAVENOUS

## 2023-02-11 MED ORDER — ONDANSETRON HCL 4 MG/2ML IJ SOLN: 4.0000 mg | Freq: Once | INTRAMUSCULAR | Status: DC | PRN

## 2023-02-11 MED ORDER — ACETAMINOPHEN 500 MG PO TABS
1000.0000 mg | ORAL_TABLET | Freq: Four times a day (QID) | ORAL | Status: AC
  Administered 2023-02-11 – 2023-02-12 (×4): 1000 mg via ORAL
  Filled 2023-02-11 (×4): qty 2

## 2023-02-11 MED ORDER — ACETAMINOPHEN 325 MG PO TABS
650.0000 mg | ORAL_TABLET | ORAL | Status: DC | PRN
  Administered 2023-02-13 – 2023-02-15 (×3): 650 mg via ORAL
  Filled 2023-02-11 (×4): qty 2

## 2023-02-11 MED ORDER — MENTHOL 3 MG MT LOZG: 1.0000 | LOZENGE | OROMUCOSAL | Status: DC | PRN

## 2023-02-11 MED ORDER — LIDOCAINE 2% (20 MG/ML) 5 ML SYRINGE
INTRAMUSCULAR | Status: DC | PRN
  Administered 2023-02-11: 100 mg via INTRAVENOUS

## 2023-02-11 MED ORDER — LORATADINE 10 MG PO TABS
10.0000 mg | ORAL_TABLET | Freq: Every day | ORAL | Status: DC
  Administered 2023-02-11 – 2023-02-18 (×8): 10 mg via ORAL
  Filled 2023-02-11 (×8): qty 1

## 2023-02-11 MED ORDER — BACITRACIN ZINC 500 UNIT/GM EX OINT
TOPICAL_OINTMENT | CUTANEOUS | Status: AC
  Filled 2023-02-11: qty 28.35

## 2023-02-11 MED ORDER — ALBUMIN HUMAN 5 % IV SOLN: INTRAVENOUS | Status: DC | PRN

## 2023-02-11 MED ORDER — DEXAMETHASONE SODIUM PHOSPHATE 10 MG/ML IJ SOLN
INTRAMUSCULAR | Status: AC
  Filled 2023-02-11: qty 1

## 2023-02-11 MED ORDER — MIDAZOLAM HCL 2 MG/2ML IJ SOLN
INTRAMUSCULAR | Status: AC
  Filled 2023-02-11: qty 2

## 2023-02-11 MED ORDER — MIDAZOLAM HCL 2 MG/2ML IJ SOLN
INTRAMUSCULAR | Status: DC | PRN
  Administered 2023-02-11: 2 mg via INTRAVENOUS

## 2023-02-11 MED ORDER — EPHEDRINE 5 MG/ML INJ
INTRAVENOUS | Status: AC
  Filled 2023-02-11: qty 5

## 2023-02-11 MED ORDER — LIDOCAINE 2% (20 MG/ML) 5 ML SYRINGE
INTRAMUSCULAR | Status: AC
  Filled 2023-02-11: qty 5

## 2023-02-11 MED ORDER — ACETAMINOPHEN 650 MG RE SUPP: 650.0000 mg | RECTAL | Status: DC | PRN

## 2023-02-11 MED ORDER — ARTIFICIAL TEARS OPHTHALMIC OINT
TOPICAL_OINTMENT | OPHTHALMIC | Status: AC
  Filled 2023-02-11: qty 3.5

## 2023-02-11 SURGICAL SUPPLY — 70 items
APL SKNCLS STERI-STRIP NONHPOA (GAUZE/BANDAGES/DRESSINGS) ×1
BAG COUNTER SPONGE SURGICOUNT (BAG) ×1 IMPLANT
BAG SPNG CNTER NS LX DISP (BAG) ×1
BASKET BONE COLLECTION (BASKET) ×1 IMPLANT
BENZOIN TINCTURE PRP APPL 2/3 (GAUZE/BANDAGES/DRESSINGS) ×1 IMPLANT
BLADE CLIPPER SURG (BLADE) IMPLANT
BUR MATCHSTICK NEURO 3.0 LAGG (BURR) ×1 IMPLANT
BUR PRECISION FLUTE 6.0 (BURR) ×1 IMPLANT
CAGE ALTERA 10X31X9-13 15D (Cage) IMPLANT
CANISTER SUCT 3000ML PPV (MISCELLANEOUS) ×1 IMPLANT
CAP LOCK DLX THRD (Cap) IMPLANT
CNTNR URN SCR LID CUP LEK RST (MISCELLANEOUS) ×1 IMPLANT
CONT SPEC 4OZ STRL OR WHT (MISCELLANEOUS) ×1
COVER BACK TABLE 60X90IN (DRAPES) ×1 IMPLANT
DRAPE C-ARM 42X72 X-RAY (DRAPES) ×2 IMPLANT
DRAPE HALF SHEET 40X57 (DRAPES) ×1 IMPLANT
DRAPE LAPAROTOMY 100X72X124 (DRAPES) ×1 IMPLANT
DRAPE SURG 17X23 STRL (DRAPES) ×4 IMPLANT
DRSG OPSITE POSTOP 4X6 (GAUZE/BANDAGES/DRESSINGS) ×1 IMPLANT
ELECT BLADE 4.0 EZ CLEAN MEGAD (MISCELLANEOUS) ×1
ELECT REM PT RETURN 9FT ADLT (ELECTROSURGICAL) ×1
ELECTRODE BLDE 4.0 EZ CLN MEGD (MISCELLANEOUS) ×1 IMPLANT
ELECTRODE REM PT RTRN 9FT ADLT (ELECTROSURGICAL) ×1 IMPLANT
EVACUATOR 1/8 PVC DRAIN (DRAIN) IMPLANT
GAUZE 4X4 16PLY ~~LOC~~+RFID DBL (SPONGE) ×1 IMPLANT
GLOVE BIO SURGEON STRL SZ 6 (GLOVE) ×1 IMPLANT
GLOVE BIO SURGEON STRL SZ8 (GLOVE) ×2 IMPLANT
GLOVE BIO SURGEON STRL SZ8.5 (GLOVE) ×2 IMPLANT
GLOVE BIOGEL PI IND STRL 6.5 (GLOVE) ×1 IMPLANT
GLOVE EXAM NITRILE XL STR (GLOVE) IMPLANT
GOWN STRL REUS W/ TWL LRG LVL3 (GOWN DISPOSABLE) ×1 IMPLANT
GOWN STRL REUS W/ TWL XL LVL3 (GOWN DISPOSABLE) ×2 IMPLANT
GOWN STRL REUS W/TWL 2XL LVL3 (GOWN DISPOSABLE) IMPLANT
GOWN STRL REUS W/TWL LRG LVL3 (GOWN DISPOSABLE) ×1
GOWN STRL REUS W/TWL XL LVL3 (GOWN DISPOSABLE) ×2
HEMOSTAT POWDER KIT SURGIFOAM (HEMOSTASIS) ×1 IMPLANT
KIT BASIN OR (CUSTOM PROCEDURE TRAY) ×1 IMPLANT
KIT GRAFTMAG DEL NEURO DISP (NEUROSURGERY SUPPLIES) IMPLANT
KIT POSITION SURG JACKSON T1 (MISCELLANEOUS) ×1 IMPLANT
KIT TURNOVER KIT B (KITS) ×1 IMPLANT
NDL HYPO 21X1.5 SAFETY (NEEDLE) IMPLANT
NDL HYPO 22X1.5 SAFETY MO (MISCELLANEOUS) ×1 IMPLANT
NEEDLE HYPO 21X1.5 SAFETY (NEEDLE) IMPLANT
NEEDLE HYPO 22X1.5 SAFETY MO (MISCELLANEOUS) ×1 IMPLANT
NS IRRIG 1000ML POUR BTL (IV SOLUTION) ×1 IMPLANT
PACK LAMINECTOMY NEURO (CUSTOM PROCEDURE TRAY) ×1 IMPLANT
PAD ARMBOARD 7.5X6 YLW CONV (MISCELLANEOUS) ×3 IMPLANT
PASTE DBM INTERGRO 0.5CC (Bone Implant) IMPLANT
PATTIES SURGICAL .5 X1 (DISPOSABLE) IMPLANT
PATTIES SURGICAL 1X1 (DISPOSABLE) IMPLANT
PUTTY DBM 2CC CALC GRAN (Putty) IMPLANT
PUTTY DBM 5CC CALC GRAN (Putty) IMPLANT
ROD CURVED TI 6.35X70 (Rod) IMPLANT
SCREW PA DLX CREO 7.5X50 (Screw) IMPLANT
SOL ELECTROSURG ANTI STICK (MISCELLANEOUS) ×1
SOLUTION ELECTROSURG ANTI STCK (MISCELLANEOUS) ×1 IMPLANT
SOLUTION IRRIG SURGIPHOR (IV SOLUTION) ×1 IMPLANT
SPIKE FLUID TRANSFER (MISCELLANEOUS) ×1 IMPLANT
SPONGE NEURO XRAY DETECT 1X3 (DISPOSABLE) IMPLANT
SPONGE SURGIFOAM ABS GEL 100 (HEMOSTASIS) IMPLANT
SPONGE T-LAP 4X18 ~~LOC~~+RFID (SPONGE) IMPLANT
STRIP CLOSURE SKIN 1/2X4 (GAUZE/BANDAGES/DRESSINGS) ×1 IMPLANT
SUT VIC AB 1 CT1 18XBRD ANBCTR (SUTURE) ×2 IMPLANT
SUT VIC AB 1 CT1 8-18 (SUTURE) ×2
SUT VIC AB 2-0 CP2 18 (SUTURE) ×2 IMPLANT
SYR 20ML LL LF (SYRINGE) IMPLANT
TOWEL GREEN STERILE (TOWEL DISPOSABLE) ×1 IMPLANT
TOWEL GREEN STERILE FF (TOWEL DISPOSABLE) ×1 IMPLANT
TRAY FOLEY MTR SLVR 16FR STAT (SET/KITS/TRAYS/PACK) ×1 IMPLANT
WATER STERILE IRR 1000ML POUR (IV SOLUTION) ×1 IMPLANT

## 2023-02-11 NOTE — Plan of Care (Signed)
  Problem: Education: Goal: Ability to describe self-care measures that may prevent or decrease complications (Diabetes Survival Skills Education) will improve Outcome: Progressing Goal: Individualized Educational Video(s) Outcome: Progressing   Problem: Coping: Goal: Ability to adjust to condition or change in health will improve Outcome: Progressing   Problem: Fluid Volume: Goal: Ability to maintain a balanced intake and output will improve Outcome: Progressing   Problem: Health Behavior/Discharge Planning: Goal: Ability to identify and utilize available resources and services will improve Outcome: Progressing Goal: Ability to manage health-related needs will improve Outcome: Progressing   Problem: Metabolic: Goal: Ability to maintain appropriate glucose levels will improve Outcome: Progressing   Problem: Nutritional: Goal: Maintenance of adequate nutrition will improve Outcome: Progressing Goal: Progress toward achieving an optimal weight will improve Outcome: Progressing   Problem: Skin Integrity: Goal: Risk for impaired skin integrity will decrease Outcome: Progressing   Problem: Tissue Perfusion: Goal: Adequacy of tissue perfusion will improve Outcome: Progressing   Problem: Education: Goal: Ability to verbalize activity precautions or restrictions will improve Outcome: Progressing Goal: Knowledge of the prescribed therapeutic regimen will improve Outcome: Progressing Goal: Understanding of discharge needs will improve Outcome: Progressing   Problem: Activity: Goal: Ability to avoid complications of mobility impairment will improve Outcome: Progressing Goal: Ability to tolerate increased activity will improve Outcome: Progressing Goal: Will remain free from falls Outcome: Progressing   Problem: Bowel/Gastric: Goal: Gastrointestinal status for postoperative course will improve Outcome: Progressing   Problem: Clinical Measurements: Goal: Ability to  maintain clinical measurements within normal limits will improve Outcome: Progressing Goal: Postoperative complications will be avoided or minimized Outcome: Progressing Goal: Diagnostic test results will improve Outcome: Progressing   Problem: Pain Management: Goal: Pain level will decrease Outcome: Progressing   Problem: Skin Integrity: Goal: Will show signs of wound healing Outcome: Progressing   Problem: Health Behavior/Discharge Planning: Goal: Identification of resources available to assist in meeting health care needs will improve Outcome: Progressing   Problem: Bladder/Genitourinary: Goal: Urinary functional status for postoperative course will improve Outcome: Progressing   Problem: Education: Goal: Knowledge of General Education information will improve Description: Including pain rating scale, medication(s)/side effects and non-pharmacologic comfort measures Outcome: Progressing   Problem: Health Behavior/Discharge Planning: Goal: Ability to manage health-related needs will improve Outcome: Progressing   Problem: Clinical Measurements: Goal: Ability to maintain clinical measurements within normal limits will improve Outcome: Progressing Goal: Will remain free from infection Outcome: Progressing Goal: Diagnostic test results will improve Outcome: Progressing Goal: Respiratory complications will improve Outcome: Progressing Goal: Cardiovascular complication will be avoided Outcome: Progressing   Problem: Activity: Goal: Risk for activity intolerance will decrease Outcome: Progressing   Problem: Nutrition: Goal: Adequate nutrition will be maintained Outcome: Progressing   Problem: Coping: Goal: Level of anxiety will decrease Outcome: Progressing   Problem: Elimination: Goal: Will not experience complications related to bowel motility Outcome: Progressing Goal: Will not experience complications related to urinary retention Outcome: Progressing    Problem: Pain Managment: Goal: General experience of comfort will improve Outcome: Progressing   Problem: Safety: Goal: Ability to remain free from injury will improve Outcome: Progressing   Problem: Skin Integrity: Goal: Risk for impaired skin integrity will decrease Outcome: Progressing   

## 2023-02-11 NOTE — Transfer of Care (Signed)
Immediate Anesthesia Transfer of Care Note  Patient: Eddie Ryan  Procedure(s) Performed: Posterior Lumbar Interbody Fusion, Interbody Prosthesis, Posterior Instrumentation, Lumbar three-four, Lumbar four-five (Back)  Patient Location: PACU  Anesthesia Type:General  Level of Consciousness: drowsy and patient cooperative  Airway & Oxygen Therapy: Patient connected to face mask oxygen  Post-op Assessment: Report given to RN, Post -op Vital signs reviewed and stable, and Patient moving all extremities X 4  Post vital signs: Reviewed and stable  Last Vitals:  Vitals Value Taken Time  BP 94/68 02/11/23 1700  Temp    Pulse 71 02/11/23 1701  Resp 18 02/11/23 1701  SpO2 97 % 02/11/23 1701  Vitals shown include unvalidated device data.  Last Pain:  Vitals:   02/11/23 0913  TempSrc:   PainSc: 4          Complications: There were no known notable events for this encounter.

## 2023-02-11 NOTE — Anesthesia Procedure Notes (Signed)
Procedure Name: Intubation Date/Time: 02/11/2023 12:16 PM  Performed by: Mayer Camel, CRNAPre-anesthesia Checklist: Patient identified, Emergency Drugs available, Suction available and Patient being monitored Patient Re-evaluated:Patient Re-evaluated prior to induction Oxygen Delivery Method: Circle System Utilized Preoxygenation: Pre-oxygenation with 100% oxygen Induction Type: IV induction Ventilation: Mask ventilation without difficulty and Oral airway inserted - appropriate to patient size Laryngoscope Size: Glidescope and 4 Grade View: Grade I Tube type: Oral Tube size: 7.5 mm Number of attempts: 1 Airway Equipment and Method: Stylet, Oral airway and Video-laryngoscopy Placement Confirmation: ETT inserted through vocal cords under direct vision, positive ETCO2 and breath sounds checked- equal and bilateral Secured at: 23 cm Tube secured with: Tape Dental Injury: Teeth and Oropharynx as per pre-operative assessment  Comments: Elective glide use d/t hx of c/s fusion

## 2023-02-11 NOTE — Op Note (Signed)
Brief history: The patient is a 67 year old white male who has complained of back and right greater than left leg pain consistent with neurogenic claudication.  He has failed medical management and was worked up with a lumbar MRI and lumbar x-rays and a lumbar myelo CT.  This demonstrated a lumbar spondylolisthesis, spinal stenosis, etc.  I discussed the various treatment options with him.  He has decided proceed with surgery.  Preoperative diagnosis: L3-4 and L4-5 spondylolisthesis, spinal stenosis, degenerative disc disease, spinal stenosis compressing both the L3, L4 and L5 nerve roots; lumbago; lumbar radiculopathy; neurogenic claudication  Postoperative diagnosis: The same  Procedure: Bilateral L3-4 and L4-5 laminotomy/foraminotomies/medial facetectomy to decompress the bilateral L3, L4 and L5 nerve roots(the work required to do this was in addition to the work required to do the posterior lumbar interbody fusion because of the patient's spinal stenosis, facet arthropathy. Etc. requiring a wide decompression of the nerve roots.);  Right L3-4 and L4-5 transforaminal lumbar interbody fusion with local morselized autograft bone and Zimmer DBM; insertion of interbody prosthesis at L3-4 and L4-5 (globus peek expandable interbody prosthesis); posterior segmental instrumentation from L3 to L5 with globus titanium pedicle screws and rods; posterior lateral arthrodesis at L3-4 and L4-5 with local morselized autograft bone and Zimmer DBM.  Surgeon: Dr. Delma Officer  Asst.: Dr. Coletta Memos  Anesthesia: Gen. endotracheal  Estimated blood loss: 250 cc  Drains: None  Complications: None  Description of procedure: The patient was brought to the operating room by the anesthesia team. General endotracheal anesthesia was induced. The patient was turned to the prone position on the Wilson frame. The patient's lumbosacral region was then prepared with Betadine scrub and Betadine solution. Sterile drapes were  applied.  I then injected the area to be incised with Marcaine with epinephrine solution. I then used the scalpel to make a linear midline incision over the L3-4 and L4-5 interspace. I then used electrocautery to perform a bilateral subperiosteal dissection exposing the spinous process and lamina of L3-4 and L4-5. We then obtained intraoperative radiograph to confirm our location. We then inserted the Verstrac retractor to provide exposure.  I began the decompression by using the high speed drill to perform laminotomies at L3-4 and L4-5 bilaterally. We then used the Kerrison punches to widen the laminotomy and removed the ligamentum flavum at L3-4 and L4-5 bilaterally. We used the Kerrison punches to remove the medial facets at L3-4 and L4-5 bilaterally, we removed the right L3-4 and L4-5 facet. We performed wide foraminotomies about the bilateral L3, L4 and L5 nerve roots completing the decompression.  We now turned our attention to the posterior lumbar interbody fusion. I used a scalpel to incise the intervertebral disc at L3-4 and L4-5 bilaterally. I then performed a partial intervertebral discectomy at L3-4 and L4-5 bilaterally using the pituitary forceps. We prepared the vertebral endplates at L3-4 and L4-5 bilaterally for the fusion by removing the soft tissues with the curettes. We then used the trial spacers to pick the appropriate sized interbody prosthesis. We prefilled his prosthesis with a combination of local morselized autograft bone that we obtained during the decompression as well as Zimmer DBM. We inserted the prefilled prosthesis into the interspace at L3-4 and L4-5 from the right, we then turned and expanded the prosthesis. There was a good snug fit of the prosthesis in the interspace. We then filled and the remainder of the intervertebral disc space with local morselized autograft bone and Zimmer DBM. This completed the posterior lumbar interbody  arthrodesis.  During the decompression and  insertion of the prosthesis the assistant protected the thecal sac and nerve roots with the D'Errico retractor.  We now turned attention to the instrumentation. Under fluoroscopic guidance we cannulated the bilateral L3, L4 and L5 pedicles with the bone probe. We then removed the bone probe. We then tapped the pedicle with a 6.5 millimeter tap. We then removed the tap. We probed inside the tapped pedicle with a ball probe to rule out cortical breaches. We then inserted a 7.5 x 50 millimeter pedicle screw into the L3, L4 and L5 pedicles bilaterally under fluoroscopic guidance. We then palpated along the medial aspect of the pedicles to rule out cortical breaches. There were none. The nerve roots were not injured. We then connected the unilateral pedicle screws with a lordotic rod. We compressed the construct and secured the rod in place with the caps. We then tightened the caps appropriately. This completed the instrumentation from L3-L5 bilaterally.  We now turned our attention to the posterior lateral arthrodesis at L3-4 and L4-5. We used the high-speed drill to decorticate the remainder of the facets, pars, transverse process at L3-4 and L4-5. We then applied a combination of local morselized autograft bone and Zimmer DBM over these decorticated posterior lateral structures. This completed the posterior lateral arthrodesis.  We then obtained hemostasis using bipolar electrocautery. We irrigated the wound out with Betadine solution. We inspected the thecal sac and nerve roots and noted they were well decompressed. We then removed the retractor.  We injected Exparel . We reapproximated patient's thoracolumbar fascia with interrupted #1 Vicryl suture. We reapproximated patient's subcutaneous tissue with interrupted 2-0 Vicryl suture. The reapproximated patient's skin with Steri-Strips and benzoin. The wound was then coated with bacitracin ointment. A sterile dressing was applied. The drapes were removed. The  patient was subsequently returned to the supine position where they were extubated by the anesthesia team. He was then transported to the post anesthesia care unit in stable condition. All sponge instrument and needle counts were reportedly correct at the end of this case.

## 2023-02-11 NOTE — H&P (Signed)
Subjective: The patient is a 67 year old white male who is complaining of back and right greater left leg pain consistent with neurogenic claudication/lumbar radiculopathy.  He has failed medical management.  He was worked up with lumbar x-rays, a lumbar MRI and a lumbar myelo CT.  This demonstrated spondylolisthesis, spinal stenosis, etc.  I discussed the various treatment options with him.  He has decided proceed with surgery.  Past Medical History:  Diagnosis Date   Anxiety    Arthritis    Collagen vascular disease (HCC)    Depression    Diabetes mellitus without complication (HCC)    DVT (deep venous thrombosis) (HCC)    Headache    occasionally   History of kidney stones    Hypertension    Lupus anticoagulant disorder (HCC)    Lymphoma (HCC) 07/2020   Sleep apnea    Umbilical hernia     Past Surgical History:  Procedure Laterality Date   CERVICAL FUSION     CYSTOSCOPY W/ RETROGRADES Left 04/26/2020   Procedure: CYSTOSCOPY WITH RETROGRADE PYELOGRAM;  Surgeon: Vanna Scotland, MD;  Location: [facility];  Service: Urology;  Laterality: Left;   CYSTOSCOPY WITH URETEROSCOPY AND STENT PLACEMENT Right 07/02/2020   Procedure: CYSTOSCOPY WITH URETEROSCOPY AND STENT PLACEMENT;  Surgeon: Vanna Scotland, MD;  Location: [facility];  Service: Urology;  Laterality: Right;   CYSTOSCOPY/URETEROSCOPY/HOLMIUM LASER/STENT PLACEMENT Right 04/26/2020   Procedure: CYSTOSCOPY/URETEROSCOPY/HOLMIUM LASER/STENT PLACEMENT;  Surgeon: Vanna Scotland, MD;  Location: [facility];  Service: Urology;  Laterality: Right;   IR IMAGING GUIDED PORT INSERTION  07/27/2020   IR REMOVAL TUN ACCESS W/ PORT W/O FL MOD SED  02/08/2021   PLANTAR FASCIA SURGERY Bilateral    ROTATOR CUFF REPAIR Left    THUMB FUSION Right    URETERAL BIOPSY Right 04/26/2020   Procedure: URETERAL BIOPSY with ablation;  Surgeon: Vanna Scotland, MD;  Location: [facility];  Service: Urology;  Laterality: Right;   URETEROSCOPY WITH HOLMIUM LASER  LITHOTRIPSY      Allergies  Allergen Reactions   Losartan Potassium-Hctz Other (See Comments)    Unknown reaction.   Morphine And Codeine Itching    WITH PROLONGED USE    Social History   Tobacco Use   Smoking status: Former    Types: Cigarettes    Quit date: 1981    Years since quitting: 43.5   Smokeless tobacco: Never  Substance Use Topics   Alcohol use: Never    Family History  Problem Relation Age of Onset   Dementia Mother    Pancreatic cancer Father    Prostate cancer Paternal Grandmother    Leukemia Nephew    Prior to Admission medications   Medication Sig Start Date End Date Taking? Authorizing Provider  ALPRAZolam Prudy Feeler) 0.5 MG tablet Take 0.5 mg by mouth in the morning and at bedtime.  01/29/20  Yes [provider]  buPROPion (WELLBUTRIN XL) 150 MG 24 hr tablet Take 300 mg by mouth daily.  01/29/20  Yes [provider]  docusate sodium (COLACE) 100 MG capsule Take 200 mg by mouth daily.   Yes [provider]  ergocalciferol (VITAMIN D2) 1.25 MG (50000 UT) capsule Take 50,000 Units by mouth every Monday.    Yes [provider]  escitalopram (LEXAPRO) 20 MG tablet Take 20 mg by mouth daily.  01/29/20  Yes [provider]  etodolac (LODINE) 400 MG tablet Take 400 mg by mouth daily.   Yes [provider]  glipiZIDE (GLUCOTROL XL) 10 MG 24 hr  tablet Take 10 mg by mouth daily with breakfast. 06/07/20  Yes [provider]  loratadine (CLARITIN) 10 MG tablet Take 10 mg by mouth daily.    Yes [provider]  Magnesium 500 MG TABS Take 1,000 mg by mouth daily.   Yes [provider]  oxyCODONE-acetaminophen (PERCOCET) 5-325 MG tablet Take 1-2 tablets by mouth every 4 (four) hours as needed for moderate pain or severe pain. 07/02/20  Yes Vanna Scotland, MD  rosuvastatin (CRESTOR) 10 MG tablet Take 10 mg by mouth at bedtime. 01/31/21  Yes [provider]  telmisartan (MICARDIS) 80 MG tablet  Take 40 mg by mouth every evening.  01/29/20  Yes [provider]  warfarin (COUMADIN) 5 MG tablet Take 5 mg by mouth See admin instructions. Take 7.5 mg by mouth on Saturday evening & take 5 mg on Sunday, Monday, Tuesday, Wednesday, Thursday & Friday evening. 03/23/19  Yes [provider]  diclofenac Sodium (VOLTAREN) 1 % GEL Apply 1 g topically 4 (four) times daily as needed (thumb as needed for pain.).     [provider]     Review of Systems  Positive ROS: As above  All other systems have been reviewed and were otherwise negative with the exception of those mentioned in the HPI and as above.  Objective: Vital signs in last 24 hours: Temp:  [97.5 F (36.4 C)] 97.5 F (36.4 C) (07/03 0902) Pulse Rate:  [79] 79 (07/03 0902) Resp:  [18] 18 (07/03 0902) BP: (128)/(89) 128/89 (07/03 0902) SpO2:  [95 %] 95 % (07/03 0902) Weight:  [103.3 kg] 103.3 kg (07/03 0902) Estimated body mass index is 29.25 kg/m as calculated from the following:   Height as of this encounter: 6\' 2"  (1.88 m).   Weight as of this encounter: 103.3 kg.   General Appearance: Alert Head: Normocephalic, without obvious abnormality, atraumatic Eyes: PERRL, conjunctiva/corneas clear, EOM's intact,    Ears: Normal  Throat: Normal  Neck: Supple, Back: unremarkable Lungs: Clear to auscultation bilaterally, respirations unlabored Heart: Regular rate and rhythm, no murmur, rub or gallop Abdomen: Soft, non-tender Extremities: Extremities normal, atraumatic, no cyanosis or edema Skin: unremarkable  NEUROLOGIC:   Mental status: alert and oriented,Motor Exam - grossly normal Sensory Exam - grossly normal Reflexes:  Coordination - grossly normal Gait - grossly normal Balance - grossly normal Cranial Nerves: I: smell Not tested  II: visual acuity  OS: Normal  OD: Normal   II: visual fields Full to confrontation  II: pupils Equal, round, reactive to light  III,VII: ptosis None  III,IV,VI:  extraocular muscles  Full ROM  V: mastication Normal  V: facial light touch sensation  Normal  V,VII: corneal reflex  Present  VII: facial muscle function - upper  Normal  VII: facial muscle function - lower Normal  VIII: hearing Not tested  IX: soft palate elevation  Normal  IX,X: gag reflex Present  XI: trapezius strength  5/5  XI: sternocleidomastoid strength 5/5  XI: neck flexion strength  5/5  XII: tongue strength  Normal    Data Review Lab Results  Component Value Date   WBC 6.5 11/04/2022   HGB 14.2 11/04/2022   HCT 43.4 11/04/2022   MCV 85.9 11/04/2022   PLT 171 11/04/2022   Lab Results  Component Value Date   NA 138 11/04/2022   K 4.4 11/04/2022   CL 105 11/04/2022   CO2 24 11/04/2022   BUN 20 11/04/2022   CREATININE 1.58 (H) 11/04/2022  GLUCOSE 186 (H) 11/04/2022   Lab Results  Component Value Date   INR 1.0 02/11/2023   PROTIME 30.0 (H) 07/01/2010    Assessment/Plan: Lumbar degenerative disease, facet arthropathy, spondylolisthesis, spinal stenosis, lumbar radiculopathy, neurogenic claudication, lumbago: I have discussed the situation with the patient.  I reviewed his imaging studies with him and pointed out the abnormalities.  We have discussed the various treatment options including surgery.  I have described the surgical treatment option of an L3-4 and L4-5 decompression, instrumentation and fusion.  I have shown him surgical models.  I have given him a surgical pamphlet.  We have discussed the risk, benefits, alternatives, expected postop course, and likelihood of achieving our goals with surgery.  I have answered all the patient's questions.  He has decided proceed with surgery.   Cristi Loron 02/11/2023 11:55 AM

## 2023-02-11 NOTE — Progress Notes (Signed)
   02/11/23 2100  BiPAP/CPAP/SIPAP  Reason BIPAP/CPAP not in use Non-compliant

## 2023-02-11 NOTE — Anesthesia Postprocedure Evaluation (Signed)
Anesthesia Post Note  Patient: Eddie Ryan  Procedure(s) Performed: Posterior Lumbar Interbody Fusion, Interbody Prosthesis, Posterior Instrumentation, Lumbar three-four, Lumbar four-five (Back)     Patient location during evaluation: PACU Anesthesia Type: General Level of consciousness: awake and alert Pain management: pain level controlled Vital Signs Assessment: post-procedure vital signs reviewed and stable Respiratory status: spontaneous breathing, nonlabored ventilation, respiratory function stable and patient connected to nasal cannula oxygen Cardiovascular status: blood pressure returned to baseline and stable Postop Assessment: no apparent nausea or vomiting Anesthetic complications: no  There were no known notable events for this encounter.  Last Vitals:  Vitals:   02/11/23 1735 02/11/23 1745  BP:  101/73  Pulse: 71 74  Resp: 11 20  Temp: 36.4 C   SpO2: 96% 95%    Last Pain:  Vitals:   02/11/23 1735  TempSrc:   PainSc: 6                  Adaiah Morken S

## 2023-02-12 LAB — GLUCOSE, CAPILLARY
Glucose-Capillary: 115 mg/dL — ABNORMAL HIGH (ref 70–99)
Glucose-Capillary: 135 mg/dL — ABNORMAL HIGH (ref 70–99)
Glucose-Capillary: 139 mg/dL — ABNORMAL HIGH (ref 70–99)
Glucose-Capillary: 181 mg/dL — ABNORMAL HIGH (ref 70–99)
Glucose-Capillary: 187 mg/dL — ABNORMAL HIGH (ref 70–99)
Glucose-Capillary: 245 mg/dL — ABNORMAL HIGH (ref 70–99)

## 2023-02-12 NOTE — Progress Notes (Signed)
Orthopedic Tech Progress Note Patient Details:  TU BRITTING 08-Feb-1956 [id number]  Ortho Devices Type of Ortho Device: Lumbar corsett Ortho Device/Splint Interventions: Ordered, Application, Adjustment  I deliverred the brace to the patient and sized it to the one in the room. Post Interventions Patient Tolerated: Well Instructions Provided: Care of device, Adjustment of device  Trinna Post 02/12/2023, 6:21 AM

## 2023-02-12 NOTE — Progress Notes (Signed)
  NEUROSURGERY PROGRESS NOTE   No issues overnight. Pt reports significant back pain but appears reasonably comfortable in bedside chair. Ambulated with rolling walker in hallway.  EXAM:  BP 98/64 (BP Location: Left Arm)   Pulse 77   Temp 98 F (36.7 C) (Oral)   Resp 18   Ht 6\' 2"  (1.88 m)   Wt 103.3 kg   SpO2 98%   BMI 29.25 kg/m   Awake, alert, oriented  Speech fluent, appropriate  CN grossly intact  5/5 BUE/BLE   IMPRESSION:  67 y.o. male POD#1 L3-5 PLIF, recovering as expected Hx of DVT with lupus anticoagulant  PLAN: - Cont to mobilize as tolerated - Hold therapeutic lovenox, would be comfortable to restart on POD# 3 if clinically stable - Reassess for d/c home tomorrow   Lisbeth Renshaw, MD [facility]

## 2023-02-12 NOTE — Plan of Care (Signed)
  Problem: Education: Goal: Ability to describe self-care measures that may prevent or decrease complications (Diabetes Survival Skills Education) will improve Outcome: Progressing Goal: Individualized Educational Video(s) Outcome: Progressing   Problem: Coping: Goal: Ability to adjust to condition or change in health will improve Outcome: Progressing   Problem: Fluid Volume: Goal: Ability to maintain a balanced intake and output will improve Outcome: Progressing   Problem: Health Behavior/Discharge Planning: Goal: Ability to identify and utilize available resources and services will improve Outcome: Progressing Goal: Ability to manage health-related needs will improve Outcome: Progressing   Problem: Metabolic: Goal: Ability to maintain appropriate glucose levels will improve Outcome: Progressing   Problem: Nutritional: Goal: Maintenance of adequate nutrition will improve Outcome: Progressing Goal: Progress toward achieving an optimal weight will improve Outcome: Progressing   Problem: Skin Integrity: Goal: Risk for impaired skin integrity will decrease Outcome: Progressing   Problem: Tissue Perfusion: Goal: Adequacy of tissue perfusion will improve Outcome: Progressing   Problem: Education: Goal: Ability to verbalize activity precautions or restrictions will improve Outcome: Progressing Goal: Knowledge of the prescribed therapeutic regimen will improve Outcome: Progressing Goal: Understanding of discharge needs will improve Outcome: Progressing   Problem: Activity: Goal: Ability to avoid complications of mobility impairment will improve Outcome: Progressing Goal: Ability to tolerate increased activity will improve Outcome: Progressing Goal: Will remain free from falls Outcome: Progressing   Problem: Bowel/Gastric: Goal: Gastrointestinal status for postoperative course will improve Outcome: Progressing   Problem: Clinical Measurements: Goal: Ability to  maintain clinical measurements within normal limits will improve Outcome: Progressing Goal: Postoperative complications will be avoided or minimized Outcome: Progressing Goal: Diagnostic test results will improve Outcome: Progressing   Problem: Pain Management: Goal: Pain level will decrease Outcome: Progressing   Problem: Skin Integrity: Goal: Will show signs of wound healing Outcome: Progressing   Problem: Health Behavior/Discharge Planning: Goal: Identification of resources available to assist in meeting health care needs will improve Outcome: Progressing   Problem: Bladder/Genitourinary: Goal: Urinary functional status for postoperative course will improve Outcome: Progressing   Problem: Education: Goal: Knowledge of General Education information will improve Description: Including pain rating scale, medication(s)/side effects and non-pharmacologic comfort measures Outcome: Progressing   Problem: Health Behavior/Discharge Planning: Goal: Ability to manage health-related needs will improve Outcome: Progressing   Problem: Clinical Measurements: Goal: Ability to maintain clinical measurements within normal limits will improve Outcome: Progressing Goal: Will remain free from infection Outcome: Progressing Goal: Diagnostic test results will improve Outcome: Progressing Goal: Respiratory complications will improve Outcome: Progressing Goal: Cardiovascular complication will be avoided Outcome: Progressing   Problem: Activity: Goal: Risk for activity intolerance will decrease Outcome: Progressing   Problem: Nutrition: Goal: Adequate nutrition will be maintained Outcome: Progressing   Problem: Coping: Goal: Level of anxiety will decrease Outcome: Progressing   Problem: Elimination: Goal: Will not experience complications related to bowel motility Outcome: Progressing Goal: Will not experience complications related to urinary retention Outcome: Progressing    Problem: Pain Managment: Goal: General experience of comfort will improve Outcome: Progressing   Problem: Safety: Goal: Ability to remain free from injury will improve Outcome: Progressing   Problem: Skin Integrity: Goal: Risk for impaired skin integrity will decrease Outcome: Progressing   

## 2023-02-12 NOTE — Progress Notes (Signed)
   02/12/23 2200  BiPAP/CPAP/SIPAP  Reason BIPAP/CPAP not in use Non-compliant   Patient refused use of CPAP.

## 2023-02-12 NOTE — Evaluation (Signed)
Physical Therapy Evaluation Patient Details Name: Eddie Ryan MRN: [id number] DOB: 11-04-1955 Today's Date: 02/12/2023  History of Present Illness  The pt is a 67 yo male presenting 7/3 for PLIF of L3-4 and L4-5 due to chronic back and RLE pain. PMH includes: anxiety, arthritis, DM II, DVT, HTN, Lupus, Lymphoma, sleep apnea, and cervical fusion.   Clinical Impression  Pt in bed upon arrival of PT, agreeable to evaluation at this time. Prior to admission the pt was ambulating without use of DME, limited in endurance and activity by pain. 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 The pt now presents with limitations in functional mobility, strength, coordination in LLE, stability, and activity tolerance due to above dx, and will continue to benefit from skilled PT to address these deficits. He was able to complete bed mobility with minA, and sit-stand transfers with minG for safety and use of RW. With ambulation however, pt walking on lateral aspect of L foot with decreased ankle stability in stance and x3 instances of L knee buckling in gait. Pt reports frequent falls (~2x/week) prior to surgery due to "tripping over my own feet" and he is unsure if incoordination in L foot is new or was the source of frequent falls. Will continue to benefit from skilled PT to practice stair training (has flight of stairs to bedroom) as well as following d/c to maximize strength and stability in LE to reduce frequency of falls.          Assistance Recommended at Discharge Frequent or constant Supervision/Assistance  If plan is discharge home, recommend the following:  Can travel by private vehicle  A little help with walking and/or transfers;A little help with bathing/dressing/bathroom;Assistance with cooking/housework;Assist for transportation;Help with stairs or ramp for entrance        Equipment Recommendations Rolling walker (2  wheels)  Recommendations for Other Services       Functional Status Assessment Patient has had a recent decline in their functional status and demonstrates the ability to make significant improvements in function in a reasonable and predictable amount of time.     Precautions / Restrictions Precautions Precautions: Back Precaution Booklet Issued: Yes (comment) Required Braces or Orthoses: Spinal Brace Spinal Brace: Thoracolumbosacral orthotic;Applied in sitting position Restrictions Weight Bearing Restrictions: No      Mobility  Bed Mobility Overal bed mobility: Needs Assistance Bed Mobility: Sidelying to Sit, Rolling Rolling: Min guard Sidelying to sit: Min assist       General bed mobility comments: minA to complete log roll, sequential cues    Transfers Overall transfer level: Needs assistance Equipment used: Rolling walker (2 wheels) Transfers: Sit to/from Stand Sit to Stand: Min guard           General transfer comment: minG for safety, pt steady with initial stance    Ambulation/Gait Ambulation/Gait assistance: Min assist Gait Distance (Feet): 200 Feet Assistive device: Rolling walker (2 wheels) Gait Pattern/deviations: Step-through pattern, Decreased stride length, Knees buckling Gait velocity: decreased Gait velocity interpretation: <1.31 ft/sec, indicative of household ambulator   General Gait Details: pt with x3 knee buckling of LLE, able to correct with minA and BUE support on RW. pt with inversion of L ankle and walking on lateral aspect of his foot with decreased ankle stability in stance phase of gait. pt with SpO2 to 86%, recovered with standing rest but deferred stairs     Balance Overall balance assessment: Needs assistance  Sitting-balance support: No upper extremity supported, Feet supported Sitting balance-Leahy Scale: Good     Standing balance support: Bilateral upper extremity supported, During functional activity Standing balance-Leahy  Scale: Fair Standing balance comment: can static stand without UE support, BUE support due to intermittent knee buckling LLE                             Pertinent Vitals/Pain Pain Assessment Pain Assessment: 0-10 Pain Score: 6  Pain Location: incision, L hip-knee Pain Descriptors / Indicators: Discomfort, Grimacing, Sharp Pain Intervention(s): Limited activity within patient's tolerance, Monitored during session, Repositioned, Premedicated before session    Home Living Family/patient expects to be discharged to:: Private residence Living Arrangements: Alone Available Help at Discharge: Friend(s);Available 24 hours/day Type of Home: House Home Access: Stairs to enter Entrance Stairs-Rails: Right;Left;Can reach both Entrance Stairs-Number of Steps: 3 Alternate Level Stairs-Number of Steps: 3 + landing + 7 Home Layout: Two level;1/2 bath on main level Home Equipment: Shower seat;Grab bars - tub/shower;BSC/3in1;Rolling Walker (2 wheels);Cane - single point Additional Comments: information is for girlfriend's house where he plans to d/c    Prior Function Prior Level of Function : Independent/Modified Independent;Working/employed             Mobility Comments: independent 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 reports sensation intact other than toes (chronic neuropathy) RLE Sensation: history of peripheral neuropathy RLE Coordination: WNL LLE Deficits / Details: pt with inversion and walking on lateral aspect of his foot wiht decreased ankle stability in stance phase of gait. pt with decreased corrdination but improved  strength compared to RLE. LLE Sensation: WNL LLE Coordination: decreased fine motor;decreased gross motor    Cervical / Trunk Assessment Cervical / Trunk Assessment: Back Surgery  Communication   Communication: No difficulties  Cognition Arousal/Alertness: Awake/alert Behavior During Therapy: WFL for tasks assessed/performed Overall Cognitive Status: Within Functional Limits for tasks assessed                                          General Comments General comments (skin integrity, edema, etc.): VSS on 3L upon arrival, low of 86% with hallway ambulation but recovered to 90s with standing rest. pt returned to 1L at end of session with SpO2 94%        Assessment/Plan    PT Assessment Patient needs continued PT services  PT Problem List Decreased strength;Decreased range of motion;Decreased activity tolerance;Decreased mobility;Decreased balance;Decreased coordination;Pain       PT Treatment Interventions DME instruction;Gait training;Stair training;Functional mobility training;Therapeutic activities;Therapeutic exercise;Balance training;Patient/family education    PT Goals (Current goals can be found in the Care Plan section)  Acute Rehab PT Goals Patient Stated Goal: return home, be able to return to work eventually PT Goal Formulation: With patient Time For Goal Achievement: 02/26/23 Potential to Achieve Goals: Good    Frequency Min 5X/week        AM-PAC PT "6 Clicks" Mobility  Outcome Measure Help needed turning from your back to your side while in a flat bed without  using bedrails?: A Little Help needed moving from lying on your back to sitting on the side of a flat bed without using bedrails?: A Little Help needed moving to and from a bed to a chair (including a wheelchair)?: A Little Help needed standing up from a chair using your arms (e.g., wheelchair or bedside chair)?: A Little Help needed to walk in hospital room?: A Little Help needed  climbing 3-5 steps with a railing? : A Little 6 Click Score: 18    End of Session Equipment Utilized During Treatment: Gait belt;Oxygen Activity Tolerance: Patient tolerated treatment well Patient left: in chair;with call bell/phone within reach;with chair alarm set Nurse Communication: Mobility status PT Visit Diagnosis: Other abnormalities of gait and mobility (R26.89);Muscle weakness (generalized) (M62.81);Pain Pain - Right/Left: Left Pain - part of body: Leg;Hip    Time: 1610-9604 PT Time Calculation (min) (ACUTE ONLY): 44 min   Charges:   PT Evaluation $PT Eval Low Complexity: 1 Low PT Treatments $Gait Training: 8-22 mins $Therapeutic Exercise: 8-22 mins PT General Charges $$ ACUTE PT VISIT: 1 Visit         Vickki Muff, PT, DPT   Acute Rehabilitation Department Office [phone] Secure Chat Communication Preferred  Ronnie Derby 02/12/2023, 11:19 AM

## 2023-02-13 DIAGNOSIS — Z751 Person awaiting admission to adequate facility elsewhere: Secondary | ICD-10-CM | POA: Diagnosis not present

## 2023-02-13 DIAGNOSIS — Z806 Family history of leukemia: Secondary | ICD-10-CM | POA: Diagnosis not present

## 2023-02-13 DIAGNOSIS — Z86718 Personal history of other venous thrombosis and embolism: Secondary | ICD-10-CM | POA: Diagnosis not present

## 2023-02-13 DIAGNOSIS — Z7984 Long term (current) use of oral hypoglycemic drugs: Secondary | ICD-10-CM | POA: Diagnosis not present

## 2023-02-13 DIAGNOSIS — Z885 Allergy status to narcotic agent status: Secondary | ICD-10-CM | POA: Diagnosis not present

## 2023-02-13 DIAGNOSIS — Z87891 Personal history of nicotine dependence: Secondary | ICD-10-CM | POA: Diagnosis not present

## 2023-02-13 DIAGNOSIS — M5116 Intervertebral disc disorders with radiculopathy, lumbar region: Secondary | ICD-10-CM | POA: Diagnosis present

## 2023-02-13 DIAGNOSIS — Z8042 Family history of malignant neoplasm of prostate: Secondary | ICD-10-CM | POA: Diagnosis not present

## 2023-02-13 DIAGNOSIS — R339 Retention of urine, unspecified: Secondary | ICD-10-CM | POA: Diagnosis not present

## 2023-02-13 DIAGNOSIS — F419 Anxiety disorder, unspecified: Secondary | ICD-10-CM | POA: Diagnosis present

## 2023-02-13 DIAGNOSIS — Z7901 Long term (current) use of anticoagulants: Secondary | ICD-10-CM | POA: Diagnosis not present

## 2023-02-13 DIAGNOSIS — N319 Neuromuscular dysfunction of bladder, unspecified: Secondary | ICD-10-CM

## 2023-02-13 DIAGNOSIS — M5416 Radiculopathy, lumbar region: Secondary | ICD-10-CM

## 2023-02-13 DIAGNOSIS — M4726 Other spondylosis with radiculopathy, lumbar region: Secondary | ICD-10-CM | POA: Diagnosis present

## 2023-02-13 DIAGNOSIS — D6862 Lupus anticoagulant syndrome: Secondary | ICD-10-CM | POA: Diagnosis present

## 2023-02-13 DIAGNOSIS — M4316 Spondylolisthesis, lumbar region: Secondary | ICD-10-CM

## 2023-02-13 DIAGNOSIS — Z7409 Other reduced mobility: Secondary | ICD-10-CM | POA: Diagnosis present

## 2023-02-13 DIAGNOSIS — I10 Essential (primary) hypertension: Secondary | ICD-10-CM | POA: Diagnosis present

## 2023-02-13 DIAGNOSIS — E119 Type 2 diabetes mellitus without complications: Secondary | ICD-10-CM | POA: Diagnosis present

## 2023-02-13 DIAGNOSIS — M48062 Spinal stenosis, lumbar region with neurogenic claudication: Secondary | ICD-10-CM | POA: Diagnosis present

## 2023-02-13 DIAGNOSIS — Z800 Family history of malignant neoplasm of digestive organs: Secondary | ICD-10-CM | POA: Diagnosis not present

## 2023-02-13 DIAGNOSIS — Z8572 Personal history of non-Hodgkin lymphomas: Secondary | ICD-10-CM | POA: Diagnosis not present

## 2023-02-13 DIAGNOSIS — Z79899 Other long term (current) drug therapy: Secondary | ICD-10-CM | POA: Diagnosis not present

## 2023-02-13 LAB — GLUCOSE, CAPILLARY
Glucose-Capillary: 106 mg/dL — ABNORMAL HIGH (ref 70–99)
Glucose-Capillary: 117 mg/dL — ABNORMAL HIGH (ref 70–99)
Glucose-Capillary: 143 mg/dL — ABNORMAL HIGH (ref 70–99)
Glucose-Capillary: 152 mg/dL — ABNORMAL HIGH (ref 70–99)
Glucose-Capillary: 153 mg/dL — ABNORMAL HIGH (ref 70–99)
Glucose-Capillary: 174 mg/dL — ABNORMAL HIGH (ref 70–99)
Glucose-Capillary: 175 mg/dL — ABNORMAL HIGH (ref 70–99)

## 2023-02-13 MED ORDER — TAMSULOSIN HCL 0.4 MG PO CAPS
0.4000 mg | ORAL_CAPSULE | Freq: Every day | ORAL | Status: DC
  Administered 2023-02-13 – 2023-02-18 (×6): 0.4 mg via ORAL
  Filled 2023-02-13 (×6): qty 1

## 2023-02-13 MED FILL — Thrombin For Soln 5000 Unit: CUTANEOUS | Qty: 5000 | Status: AC

## 2023-02-13 NOTE — Progress Notes (Signed)
   02/13/23 2100  BiPAP/CPAP/SIPAP  Reason BIPAP/CPAP not in use Non-compliant

## 2023-02-13 NOTE — Progress Notes (Signed)
  NEUROSURGERY PROGRESS NOTE   Noted to have some urinary retention this am with bladder scan 450cc, pt now starting to feel a little abdominal discomfort. Still c/o significant back pain.  EXAM:  BP 100/68 (BP Location: Left Arm)   Pulse 84   Temp 98.1 F (36.7 C) (Oral)   Resp 18   Ht 6\' 2"  (1.88 m)   Wt 103.3 kg   SpO2 94%   BMI 29.25 kg/m   Awake, alert, oriented  Speech fluent, appropriate  CN grossly intact  5/5 BUE/BLE  Dressing c/d/I  IMPRESSION:  67 y.o. male POD#2 L3-5 PLIF, recovering slowly.  Urinary retention  PLAN: - Will I&O x1 and add Flomax - Cont to mobilize   Lisbeth Renshaw, MD [facility]

## 2023-02-13 NOTE — Plan of Care (Signed)
  Problem: Education: Goal: Ability to describe self-care measures that may prevent or decrease complications (Diabetes Survival Skills Education) will improve Outcome: Progressing Goal: Individualized Educational Video(s) Outcome: Progressing   Problem: Coping: Goal: Ability to adjust to condition or change in health will improve Outcome: Progressing   Problem: Fluid Volume: Goal: Ability to maintain a balanced intake and output will improve Outcome: Progressing   Problem: Health Behavior/Discharge Planning: Goal: Ability to identify and utilize available resources and services will improve Outcome: Progressing Goal: Ability to manage health-related needs will improve Outcome: Progressing   Problem: Metabolic: Goal: Ability to maintain appropriate glucose levels will improve Outcome: Progressing   Problem: Nutritional: Goal: Maintenance of adequate nutrition will improve Outcome: Progressing Goal: Progress toward achieving an optimal weight will improve Outcome: Progressing   Problem: Skin Integrity: Goal: Risk for impaired skin integrity will decrease Outcome: Progressing   Problem: Tissue Perfusion: Goal: Adequacy of tissue perfusion will improve Outcome: Progressing   Problem: Education: Goal: Ability to verbalize activity precautions or restrictions will improve Outcome: Progressing Goal: Knowledge of the prescribed therapeutic regimen will improve Outcome: Progressing Goal: Understanding of discharge needs will improve Outcome: Progressing   Problem: Activity: Goal: Ability to avoid complications of mobility impairment will improve Outcome: Progressing Goal: Ability to tolerate increased activity will improve Outcome: Progressing Goal: Will remain free from falls Outcome: Progressing   Problem: Bowel/Gastric: Goal: Gastrointestinal status for postoperative course will improve Outcome: Progressing   Problem: Clinical Measurements: Goal: Ability to  maintain clinical measurements within normal limits will improve Outcome: Progressing Goal: Postoperative complications will be avoided or minimized Outcome: Progressing Goal: Diagnostic test results will improve Outcome: Progressing   Problem: Pain Management: Goal: Pain level will decrease Outcome: Progressing   Problem: Skin Integrity: Goal: Will show signs of wound healing Outcome: Progressing   Problem: Health Behavior/Discharge Planning: Goal: Identification of resources available to assist in meeting health care needs will improve Outcome: Progressing   Problem: Bladder/Genitourinary: Goal: Urinary functional status for postoperative course will improve Outcome: Progressing   Problem: Education: Goal: Knowledge of General Education information will improve Description: Including pain rating scale, medication(s)/side effects and non-pharmacologic comfort measures Outcome: Progressing   Problem: Health Behavior/Discharge Planning: Goal: Ability to manage health-related needs will improve Outcome: Progressing   Problem: Clinical Measurements: Goal: Ability to maintain clinical measurements within normal limits will improve Outcome: Progressing Goal: Will remain free from infection Outcome: Progressing Goal: Diagnostic test results will improve Outcome: Progressing Goal: Respiratory complications will improve Outcome: Progressing Goal: Cardiovascular complication will be avoided Outcome: Progressing   Problem: Activity: Goal: Risk for activity intolerance will decrease Outcome: Progressing   Problem: Nutrition: Goal: Adequate nutrition will be maintained Outcome: Progressing   Problem: Coping: Goal: Level of anxiety will decrease Outcome: Progressing   Problem: Elimination: Goal: Will not experience complications related to bowel motility Outcome: Progressing Goal: Will not experience complications related to urinary retention Outcome: Progressing    Problem: Pain Managment: Goal: General experience of comfort will improve Outcome: Progressing   Problem: Safety: Goal: Ability to remain free from injury will improve Outcome: Progressing   Problem: Skin Integrity: Goal: Risk for impaired skin integrity will decrease Outcome: Progressing   

## 2023-02-13 NOTE — Progress Notes (Signed)
Physical Therapy Treatment Patient Details Name: Eddie Ryan MRN: [id number] DOB: May 22, 1956 Today's Date: 02/13/2023   History of Present Illness The pt is a 67 yo male presenting 7/3 for PLIF of L3-4 and L4-5 due to chronic back and RLE pain. PMH includes: anxiety, arthritis, DM II, DVT, HTN, Lupus, Lymphoma, sleep apnea, and cervical fusion.    PT Comments  Pt agreeable to session and eager to mobilize, but presents with significant decline in function and safety awareness compared to initial evaluation yesterday. Pt was originally able to ambulate 200 ft with minG-minA and had intermittent L knee buckling, but today required modA to complete all sit-stand transfers and manage 15 ft ambulation in the room due to increased frequency of knee buckling and inability to wt bear on LLE. The pt was unable to recall spinal precautions from education yesterday and recalled 2/3 at end of session after education. Given current deficits in strength, LLE coordination, impaired stability, and new O2 needs (pt desat to 85% on RA at rest, maintained in 90s on 2L during session), I feel the patient is unable to manage safely at his home where he has a flight of stairs to reach bedroom/bathroom, and that he would benefit from short stint of intensive therapies after d/c to maximize safety with independence prior to return home. Pt is in agreement with this plan.      Assistance Recommended at Discharge Frequent or constant Supervision/Assistance  If plan is discharge home, recommend the following:  Can travel by private vehicle    Assistance with cooking/housework;Assist for transportation;Help with stairs or ramp for entrance;A lot of help with walking and/or transfers;A lot of help with bathing/dressing/bathroom      Equipment Recommendations  Rolling walker (2 wheels)    Recommendations for Other Services Rehab consult;OT consult     Precautions / Restrictions Precautions Precautions:  Back Precaution Booklet Issued: Yes (comment) Precaution Comments: pt unable to recall, reviewed in session Required Braces or Orthoses: Spinal Brace Spinal Brace: Thoracolumbosacral orthotic;Applied in sitting position Restrictions Weight Bearing Restrictions: No     Mobility  Bed Mobility Overal bed mobility: Needs Assistance Bed Mobility: Sidelying to Sit, Rolling Rolling: Min guard Sidelying to sit: Min assist, Mod assist       General bed mobility comments: minA to complete log roll, sequential cues    Transfers Overall transfer level: Needs assistance Equipment used: Rolling walker (2 wheels) Transfers: Sit to/from Stand Sit to Stand: Mod assist           General transfer comment: modA to rise with repeated cues for hand placement, maintains limited wt on LLE and knees flexed. Pt slow to rise and needing modA to steady as he rises    Ambulation/Gait Ambulation/Gait assistance: Min assist, Mod assist Gait Distance (Feet): 15 Feet (+ 48ft) Assistive device: Rolling walker (2 wheels) Gait Pattern/deviations: Step-through pattern, Decreased stride length, Knees buckling, Decreased weight shift to left, Knee flexed in stance - right, Knee flexed in stance - left, Shuffle Gait velocity: decreased Gait velocity interpretation: <1.31 ft/sec, indicative of household ambulator   General Gait Details: pt with frequent knee buckling, maintaining knees flexed and generally decreased safety awareness with movement of RW. modA to maintain balance and manage RW with mobility. needed 2 L O2 to maintain SpO2 in 90s      Balance Overall balance assessment: Needs assistance Sitting-balance support: No upper extremity supported, Feet supported Sitting balance-Leahy Scale: Good     Standing balance support: Bilateral upper extremity  supported, During functional activity Standing balance-Leahy Scale: Poor Standing balance comment: BUE support and minA to maintain static stand,  modA for mobility, frequent knee bucklking                            Cognition Arousal/Alertness: Awake/alert Behavior During Therapy: WFL for tasks assessed/performed Overall Cognitive Status: Impaired/Different from baseline Area of Impairment: Memory, Safety/judgement, Problem solving                     Memory: Decreased recall of precautions, Decreased short-term memory   Safety/Judgement: Decreased awareness of safety, Decreased awareness of deficits   Problem Solving: Requires verbal cues General Comments: pt with some mix-ups with words, unable to recall precautions today. needing increased cues for safety        Exercises      General Comments General comments (skin integrity, edema, etc.): VSS on 2L with mobility, SpO2 to 85% on RA at rest      Pertinent Vitals/Pain Pain Assessment Pain Assessment: 0-10 Pain Score: 5  Pain Location: incision, bilateral hips, sharp pain Pain Descriptors / Indicators: Discomfort, Grimacing, Sharp Pain Intervention(s): Limited activity within patient's tolerance, Monitored during session, Premedicated before session, Repositioned     PT Goals (current goals can now be found in the care plan section) Acute Rehab PT Goals Patient Stated Goal: return home, be able to return to work eventually PT Goal Formulation: With patient Time For Goal Achievement: 02/26/23 Potential to Achieve Goals: Good Progress towards PT goals: Progressing toward goals    Frequency    Min 5X/week      PT Plan Discharge plan needs to be updated       AM-PAC PT "6 Clicks" Mobility   Outcome Measure  Help needed turning from your back to your side while in a flat bed without using bedrails?: A Little Help needed moving from lying on your back to sitting on the side of a flat bed without using bedrails?: A Little Help needed moving to and from a bed to a chair (including a wheelchair)?: A Lot Help needed standing up from a  chair using your arms (e.g., wheelchair or bedside chair)?: A Lot Help needed to walk in hospital room?: A Lot Help needed climbing 3-5 steps with a railing? : Total 6 Click Score: 13    End of Session Equipment Utilized During Treatment: Gait belt;Oxygen Activity Tolerance: Patient tolerated treatment well Patient left: in chair;with call bell/phone within reach;with chair alarm set Nurse Communication: Mobility status PT Visit Diagnosis: Other abnormalities of gait and mobility (R26.89);Muscle weakness (generalized) (M62.81);Pain;Unsteadiness on feet (R26.81);History of falling (Z91.81) Pain - Right/Left: Left Pain - part of body: Leg;Hip     Time: [id number] PT Time Calculation (min) (ACUTE ONLY): 38 min  Charges:    $Gait Training: 8-22 mins $Therapeutic Exercise: 8-22 mins $Therapeutic Activity: 8-22 mins PT General Charges $$ ACUTE PT VISIT: 1 Visit                     Vickki Muff, PT, DPT   Acute Rehabilitation Department Office [phone] Secure Chat Communication Preferred   Ronnie Derby 02/13/2023, 11:18 AM

## 2023-02-13 NOTE — Consult Note (Signed)
Physical Medicine and Rehabilitation Consult Reason for Consult:Leg pain with weakness, impaired functional mobility Referring Physician: Lovell Sheehan   HPI: Eddie Ryan is a 67 y.o. male with a history of DM, DVT w/ Lupus anticoagulant disorder/CVD, B-cell lymphoma with periuteral involvement, prior cervical fusion who developed increased pain in her right>left leg with associated weakness and sensory loss consistent with neurogenic claudication. MRI and CT myelo demonstrated lumbar spondylolisthesis with spinal stenosis and radiculopathy at L3-4, L4-5. Pt failed conservative management and opted for bilateral decompression and PLIF which was performed on 02/11/23 by Dr. Lovell Sheehan. Pt with significant low back pain post-operatively as well as urine retention. He was cathed for 900cc today at 1150. Pt was up with PT today and was mod assist with transfers and walked 15' min to mod assist with RW. Frequent knee buckling noted, oxygen desating noted, pain also an issue. 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   Review of Systems  Constitutional: Negative.   HENT: Negative.    Eyes: Negative.   Respiratory: Negative.    Cardiovascular: Negative.   Gastrointestinal:  Positive for constipation. Negative for diarrhea.  Genitourinary:        Urine retention  Musculoskeletal:  Positive for back pain, joint pain and myalgias.  Skin: Negative.   Neurological:  Positive for sensory change and focal weakness.  Psychiatric/Behavioral: Negative.     Past Medical History:  Diagnosis Date   Anxiety    Arthritis    Collagen vascular disease (HCC)    Depression    Diabetes mellitus without complication (HCC)    DVT (deep venous thrombosis) (HCC)    Headache    occasionally   History of kidney stones    Hypertension    Lupus anticoagulant disorder (HCC)    Lymphoma (HCC) 07/2020   Sleep apnea    Umbilical hernia    Past Surgical History:  Procedure Laterality Date   CERVICAL  FUSION     CYSTOSCOPY W/ RETROGRADES Left 04/26/2020   Procedure: CYSTOSCOPY WITH RETROGRADE PYELOGRAM;  Surgeon: Vanna Scotland, MD;  Location: [facility];  Service: Urology;  Laterality: Left;   CYSTOSCOPY WITH URETEROSCOPY AND STENT PLACEMENT Right 07/02/2020   Procedure: CYSTOSCOPY WITH URETEROSCOPY AND STENT PLACEMENT;  Surgeon: Vanna Scotland, MD;  Location: [facility];  Service: Urology;  Laterality: Right;   CYSTOSCOPY/URETEROSCOPY/HOLMIUM LASER/STENT PLACEMENT Right 04/26/2020   Procedure: CYSTOSCOPY/URETEROSCOPY/HOLMIUM LASER/STENT PLACEMENT;  Surgeon: Vanna Scotland, MD;  Location: [facility];  Service: Urology;  Laterality: Right;   IR IMAGING GUIDED PORT INSERTION  07/27/2020   IR REMOVAL TUN ACCESS W/ PORT W/O FL MOD SED  02/08/2021   PLANTAR FASCIA SURGERY Bilateral    ROTATOR CUFF REPAIR Left    THUMB FUSION Right    URETERAL BIOPSY Right 04/26/2020   Procedure: URETERAL BIOPSY with ablation;  Surgeon: Vanna Scotland, MD;  Location: [facility];  Service: Urology;  Laterality: Right;   URETEROSCOPY WITH HOLMIUM LASER LITHOTRIPSY     Family History  Problem Relation Age of Onset   Dementia Mother    Pancreatic cancer Father    Prostate cancer Paternal Grandmother    Leukemia Nephew    Social History:  reports that he quit smoking about 43 years ago. His smoking use included cigarettes. He has never used smokeless tobacco. He reports that he does not drink alcohol and does not use drugs. Allergies:  Allergies  Allergen Reactions   Losartan Potassium-Hctz Other (See Comments)    Unknown reaction.  Morphine And Codeine Itching    WITH PROLONGED USE   Medications Prior to Admission  Medication Sig Dispense Refill   ALPRAZolam (XANAX) 0.5 MG tablet Take 0.5 mg by mouth in the morning and at bedtime.      buPROPion (WELLBUTRIN XL) 150 MG 24 hr tablet Take 300 mg by mouth daily.      docusate sodium (COLACE) 100 MG capsule Take 200 mg by mouth daily.     ergocalciferol  (VITAMIN D2) 1.25 MG (50000 UT) capsule Take 50,000 Units by mouth every Monday.      escitalopram (LEXAPRO) 20 MG tablet Take 20 mg by mouth daily.      etodolac (LODINE) 400 MG tablet Take 400 mg by mouth daily.     glipiZIDE (GLUCOTROL XL) 10 MG 24 hr tablet Take 10 mg by mouth daily with breakfast.     loratadine (CLARITIN) 10 MG tablet Take 10 mg by mouth daily.      Magnesium 500 MG TABS Take 1,000 mg by mouth daily.     oxyCODONE-acetaminophen (PERCOCET) 5-325 MG tablet Take 1-2 tablets by mouth every 4 (four) hours as needed for moderate pain or severe pain. 20 tablet 0   rosuvastatin (CRESTOR) 10 MG tablet Take 10 mg by mouth at bedtime.     telmisartan (MICARDIS) 80 MG tablet Take 40 mg by mouth every evening.      warfarin (COUMADIN) 5 MG tablet Take 5 mg by mouth See admin instructions. Take 7.5 mg by mouth on Saturday evening & take 5 mg on Sunday, Monday, Tuesday, Wednesday, Thursday & Friday evening.     diclofenac Sodium (VOLTAREN) 1 % GEL Apply 1 g topically 4 (four) times daily as needed (thumb as needed for pain.).       Home: Home Living Family/patient expects to be discharged to:: Private residence Living Arrangements: Alone Available Help at Discharge: Friend(s), Available 24 hours/day Type of Home: House Home Access: Stairs to enter [employer] of Steps: 3 Entrance Stairs-Rails: Right, Left, Can reach both Home Layout: Two level, 1/2 bath on main level Alternate Level Stairs-Number of Steps: 3 + landing + 7 Alternate Level Stairs-Rails: Left, Right Bathroom Shower/Tub: Health visitor: Handicapped height Home Equipment: Information systems manager, Grab bars - tub/shower, BSC/3in1, Agricultural consultant (2 wheels), [employer] - single point Additional Comments: information is for girlfriend's house where he plans to d/c  Functional History: Prior Function Prior Level of Function : Independent/Modified Independent, Working/employed Mobility Comments:  independent 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 Pt slow to rise and needing modA to steady as he rises Ambulation/Gait Ambulation/Gait assistance: Min assist, Mod assist Gait Distance (Feet): 15 Feet (+ 52ft) Assistive device: Rolling walker (2 wheels) Gait Pattern/deviations: Step-through pattern, Decreased stride length, Knees buckling, Decreased weight shift to left, Knee flexed in stance - right, Knee flexed in stance - left, Shuffle General Gait Details: pt with frequent knee buckling, maintaining knees flexed and generally decreased safety awareness with movement of RW. modA to maintain balance and manage RW with mobility.  needed 2 L O2 to maintain SpO2 in 90s Gait velocity: decreased Gait velocity interpretation: <1.31 ft/sec, indicative of household ambulator    ADL:    Cognition: Cognition Overall Cognitive Status: Impaired/Different from baseline Orientation Level: Oriented X4 Cognition Arousal/Alertness: Awake/alert Behavior During Therapy: WFL for tasks assessed/performed Overall Cognitive Status: Impaired/Different from baseline Area of Impairment: Memory, Safety/judgement, Problem solving Memory: Decreased recall of precautions, Decreased short-term memory Safety/Judgement: Decreased awareness of safety, Decreased awareness of deficits Problem Solving: Requires verbal cues General Comments: pt with some mix-ups with words, unable to recall  precautions today. needing increased cues for safety  Blood pressure 105/72, pulse (!) 107, temperature 99.9 F (37.7 C), temperature source Oral, resp. rate 18, height 6\' 2"  (1.88 m), weight 103.3 kg, SpO2 90 %. Physical Exam Constitutional:      Appearance: He is obese.  HENT:     Head: Normocephalic.     Right Ear: External ear normal.     Left Ear: External ear normal.     Nose: Nose normal.  Eyes:     Conjunctiva/sclera: Conjunctivae normal.  Cardiovascular:     Rate and Rhythm: Tachycardia present.  Pulmonary:     Effort: Pulmonary effort is normal.  Abdominal:     General: There is distension.  Musculoskeletal:        General: No swelling.     Cervical back: Normal range of motion.     Comments: Low back and buttocks tender with palpation and basic movement.   Neurological:     Mental Status: He is alert.     Comments: Alert and oriented x 3. Normal insight and awareness. Intact Memory. Normal language and speech. Cranial nerve exam unremarkable. MMT: UE 5/5. BLE 2+/5 HF, 3/5 KE and 4/5 ADF/PF. Pain inhibition proximally in LE's. Sensory exam appears grossly intact except in buttocks areas where there may be some slight sensory loss. DTR's 1+. No resting tone. Marland Kitchen    Psychiatric:        Mood and Affect: Mood normal.        Behavior: Behavior normal.     Results for orders placed or performed during the hospital encounter of 02/11/23 (from the past 24 hour(s))  Glucose, capillary     Status: Abnormal   Collection Time: 02/12/23  4:32 PM  Result Value Ref Range   Glucose-Capillary 139 (H) 70 - 99 mg/dL  Glucose, capillary     Status: Abnormal   Collection Time: 02/12/23  8:03 PM  Result Value Ref Range   Glucose-Capillary 181 (H) 70 - 99 mg/dL  Glucose, capillary     Status: Abnormal   Collection Time: 02/13/23 12:02 AM  Result Value Ref Range   Glucose-Capillary 153 (H) 70 - 99 mg/dL  Glucose, capillary     Status: Abnormal   Collection Time: 02/13/23  3:34 AM   Result Value Ref Range   Glucose-Capillary 174 (H) 70 - 99 mg/dL  Glucose, capillary     Status: Abnormal   Collection Time: 02/13/23  7:42 AM  Result Value Ref Range   Glucose-Capillary 117 (H) 70 - 99 mg/dL  Glucose, capillary     Status: Abnormal   Collection Time: 02/13/23 12:02 PM  Result Value Ref Range   Glucose-Capillary 175 (H) 70 - 99 mg/dL   DG Lumbar Spine 1 View  Result Date: 02/11/2023 CLINICAL DATA:  L3-5 posterior lumbar interbody fusion. EXAM: LUMBAR SPINE - 1 VIEW COMPARISON:  CT lumbar spine 12/26/2022. FINDINGS: Single intraoperative cross-table lateral view  of the lumbar spine is submitted. Numbering system utilized on 12/26/2022 is preserved. Surgical instrument tip projects posterior to the L3-4 level. Endplate degenerative changes at L3-4 and L5-S1. IMPRESSION: Intraoperative localization at L3-4. Electronically Signed   By: Leanna Battles M.D.   On: 02/11/2023 16:30   DG Lumbar Spine 2-3 Views  Result Date: 02/11/2023 CLINICAL DATA:  L3-4 and L4-5 posterior lumbar interbody fusion. EXAM: LUMBAR SPINE - 2-3 VIEW COMPARISON:  02/11/2023 at 1255 hours and CT lumbar spine 12/26/2022. FINDINGS: Three intraoperative fluoroscopic spot views the lumbar spine are submitted. Bony landmarks are not well seen. Per report, patient is undergoing L3-4 and L4-5 posterior lumbar interbody fusion. Three-level pedicle screws and interbody spacers are present. IMPRESSION: Intraoperative visualization for reported L3-4 and L4-5 posterior lumbar interbody fusion. Electronically Signed   By: Leanna Battles M.D.   On: 02/11/2023 16:25   DG C-Arm 1-60 Min-No Report  Result Date: 02/11/2023 Fluoroscopy was utilized by the requesting physician.  No radiographic interpretation.   DG C-Arm 1-60 Min-No Report  Result Date: 02/11/2023 Fluoroscopy was utilized by the requesting physician.  No radiographic interpretation.   DG C-Arm 1-60 Min-No Report  Result Date: 02/11/2023 Fluoroscopy was  utilized by the requesting physician.  No radiographic interpretation.   DG C-Arm 1-60 Min-No Report  Result Date: 02/11/2023 Fluoroscopy was utilized by the requesting physician.  No radiographic interpretation.    Assessment/Plan: Diagnosis: 67 yo male with L3-L5 spondylosis/stenosis/spondylolisthesis with associated radiculopathy and significant pain. Pt is s/p L3-L5 decompression and PLIF.  Does the need for close, 24 hr/day medical supervision in concert with the patient's rehab needs make it unreasonable for this patient to be served in a less intensive setting? Yes Co-Morbidities requiring supervision/potential complications:  -neurogenic bladder/urine retention -significant post-op pain -diabetes -neurogenic bowel/slow transit constipation Due to bladder management, bowel management, skin/wound care, disease management, medication administration, pain management, and patient education, does the patient require 24 hr/day rehab nursing? Yes Does the patient require coordinated care of a physician, rehab nurse, therapy disciplines of PT, OT to address physical and functional deficits in the context of the above medical diagnosis(es)? Yes Addressing deficits in the following areas: balance, endurance, locomotion, strength, transferring, bowel/bladder control, bathing, dressing, feeding, grooming, toileting, and psychosocial support Can the patient actively participate in an intensive therapy program of at least 3 hrs of therapy per day at least 5 days per week? Yes The potential for patient to make measurable gains while on inpatient rehab is excellent Anticipated functional outcomes upon discharge from inpatient rehab are modified independent  with PT, modified independent with OT, n/a with SLP. Estimated rehab length of stay to reach the above functional goals is: 7-10 days Anticipated discharge destination: Home Overall Rehab/Functional Prognosis: excellent  POST ACUTE  RECOMMENDATIONS: This patient's condition is appropriate for continued rehabilitative care in the following setting: CIR Patient has agreed to participate in recommended program. Yes Note that insurance prior authorization may be required for reimbursement for recommended care.  Comment: Pt lives alone in [place]. Has intermittent help. Has a few steps to enter house which is on one level. Pt was active and working up until the point where his pain became unbearable.    MEDICAL RECOMMENDATIONS: Agree with flomax for urine retention. Has hx of stones/lymphoma involving ureter. Try to get him to [facility] or toilet to empty as well.,  Resume anticoagulation when surgical appropriate May benefit from a short-term long-acting opiate to better cover pain.   I have personally performed  a face to face diagnostic evaluation of this patient. Additionally, I have examined the patient's medical record including any pertinent labs and radiographic images. If the physician assistant has documented in this note, I have reviewed and edited or otherwise concur with the physician assistant's documentation.  Thanks,  Ranelle Oyster, MD 02/13/2023

## 2023-02-13 NOTE — Progress Notes (Signed)
Inpatient Rehab Admissions Coordinator:  ? ?Per therapy recommendations,  patient was screened for CIR candidacy by Gerlene Glassburn, MS, CCC-SLP. At this time, Pt. Appears to be a a potential candidate for CIR. I will place   order for rehab consult per protocol for full assessment. Please contact me any with questions. ? ?Demarqus Jocson, MS, CCC-SLP ?Rehab Admissions Coordinator  ?[phone] (celll) ?[phone] (office) ? ?

## 2023-02-13 NOTE — Progress Notes (Signed)
Pt bladder volume 450 ml. Took pt to the bathroom to void without success. Paged on-call MD. Waiting for orders.

## 2023-02-14 LAB — GLUCOSE, CAPILLARY
Glucose-Capillary: 117 mg/dL — ABNORMAL HIGH (ref 70–99)
Glucose-Capillary: 119 mg/dL — ABNORMAL HIGH (ref 70–99)
Glucose-Capillary: 139 mg/dL — ABNORMAL HIGH (ref 70–99)
Glucose-Capillary: 147 mg/dL — ABNORMAL HIGH (ref 70–99)
Glucose-Capillary: 153 mg/dL — ABNORMAL HIGH (ref 70–99)
Glucose-Capillary: 172 mg/dL — ABNORMAL HIGH (ref 70–99)

## 2023-02-14 NOTE — Plan of Care (Signed)
  Problem: Education: Goal: Ability to describe self-care measures that may prevent or decrease complications (Diabetes Survival Skills Education) will improve Outcome: Progressing Goal: Individualized Educational Video(s) Outcome: Progressing   Problem: Coping: Goal: Ability to adjust to condition or change in health will improve Outcome: Progressing   Problem: Fluid Volume: Goal: Ability to maintain a balanced intake and output will improve Outcome: Progressing   Problem: Health Behavior/Discharge Planning: Goal: Ability to identify and utilize available resources and services will improve Outcome: Progressing Goal: Ability to manage health-related needs will improve Outcome: Progressing   Problem: Metabolic: Goal: Ability to maintain appropriate glucose levels will improve Outcome: Progressing   Problem: Nutritional: Goal: Maintenance of adequate nutrition will improve Outcome: Progressing Goal: Progress toward achieving an optimal weight will improve Outcome: Progressing   Problem: Skin Integrity: Goal: Risk for impaired skin integrity will decrease Outcome: Progressing   Problem: Tissue Perfusion: Goal: Adequacy of tissue perfusion will improve Outcome: Progressing   Problem: Education: Goal: Ability to verbalize activity precautions or restrictions will improve Outcome: Progressing Goal: Knowledge of the prescribed therapeutic regimen will improve Outcome: Progressing Goal: Understanding of discharge needs will improve Outcome: Progressing   Problem: Activity: Goal: Ability to avoid complications of mobility impairment will improve Outcome: Progressing Goal: Ability to tolerate increased activity will improve Outcome: Progressing Goal: Will remain free from falls Outcome: Progressing   Problem: Bowel/Gastric: Goal: Gastrointestinal status for postoperative course will improve Outcome: Progressing   Problem: Clinical Measurements: Goal: Ability to  maintain clinical measurements within normal limits will improve Outcome: Progressing Goal: Postoperative complications will be avoided or minimized Outcome: Progressing Goal: Diagnostic test results will improve Outcome: Progressing   Problem: Pain Management: Goal: Pain level will decrease Outcome: Progressing   Problem: Skin Integrity: Goal: Will show signs of wound healing Outcome: Progressing   Problem: Health Behavior/Discharge Planning: Goal: Identification of resources available to assist in meeting health care needs will improve Outcome: Progressing   Problem: Bladder/Genitourinary: Goal: Urinary functional status for postoperative course will improve Outcome: Progressing   Problem: Education: Goal: Knowledge of General Education information will improve Description: Including pain rating scale, medication(s)/side effects and non-pharmacologic comfort measures Outcome: Progressing   Problem: Health Behavior/Discharge Planning: Goal: Ability to manage health-related needs will improve Outcome: Progressing   Problem: Clinical Measurements: Goal: Ability to maintain clinical measurements within normal limits will improve Outcome: Progressing Goal: Will remain free from infection Outcome: Progressing Goal: Diagnostic test results will improve Outcome: Progressing Goal: Respiratory complications will improve Outcome: Progressing Goal: Cardiovascular complication will be avoided Outcome: Progressing   Problem: Activity: Goal: Risk for activity intolerance will decrease Outcome: Progressing   Problem: Nutrition: Goal: Adequate nutrition will be maintained Outcome: Progressing   Problem: Coping: Goal: Level of anxiety will decrease Outcome: Progressing   Problem: Elimination: Goal: Will not experience complications related to bowel motility Outcome: Progressing Goal: Will not experience complications related to urinary retention Outcome: Progressing    Problem: Pain Managment: Goal: General experience of comfort will improve Outcome: Progressing   Problem: Safety: Goal: Ability to remain free from injury will improve Outcome: Progressing   Problem: Skin Integrity: Goal: Risk for impaired skin integrity will decrease Outcome: Progressing   

## 2023-02-14 NOTE — Plan of Care (Signed)
  Problem: Education: Goal: Ability to describe self-care measures that may prevent or decrease complications (Diabetes Survival Skills Education) will improve Outcome: Progressing   Problem: Education: Goal: Ability to describe self-care measures that may prevent or decrease complications (Diabetes Survival Skills Education) will improve Outcome: Progressing Goal: Individualized Educational Video(s) Outcome: Progressing   Problem: Coping: Goal: Ability to adjust to condition or change in health will improve Outcome: Progressing   Problem: Fluid Volume: Goal: Ability to maintain a balanced intake and output will improve Outcome: Progressing   Problem: Health Behavior/Discharge Planning: Goal: Ability to identify and utilize available resources and services will improve Outcome: Progressing Goal: Ability to manage health-related needs will improve Outcome: Progressing   Problem: Metabolic: Goal: Ability to maintain appropriate glucose levels will improve Outcome: Progressing   Problem: Nutritional: Goal: Maintenance of adequate nutrition will improve Outcome: Progressing Goal: Progress toward achieving an optimal weight will improve Outcome: Progressing   Problem: Skin Integrity: Goal: Risk for impaired skin integrity will decrease Outcome: Progressing   Problem: Tissue Perfusion: Goal: Adequacy of tissue perfusion will improve Outcome: Progressing   Problem: Education: Goal: Ability to verbalize activity precautions or restrictions will improve Outcome: Progressing Goal: Knowledge of the prescribed therapeutic regimen will improve Outcome: Progressing Goal: Understanding of discharge needs will improve Outcome: Progressing   Problem: Activity: Goal: Ability to avoid complications of mobility impairment will improve Outcome: Progressing Goal: Ability to tolerate increased activity will improve Outcome: Progressing Goal: Will remain free from falls Outcome:  Progressing   Problem: Bowel/Gastric: Goal: Gastrointestinal status for postoperative course will improve Outcome: Progressing   Problem: Clinical Measurements: Goal: Ability to maintain clinical measurements within normal limits will improve Outcome: Progressing Goal: Postoperative complications will be avoided or minimized Outcome: Progressing Goal: Diagnostic test results will improve Outcome: Progressing   Problem: Pain Management: Goal: Pain level will decrease Outcome: Progressing   Problem: Skin Integrity: Goal: Will show signs of wound healing Outcome: Progressing   Problem: Health Behavior/Discharge Planning: Goal: Identification of resources available to assist in meeting health care needs will improve Outcome: Progressing   Problem: Bladder/Genitourinary: Goal: Urinary functional status for postoperative course will improve Outcome: Progressing   Problem: Education: Goal: Knowledge of General Education information will improve Description: Including pain rating scale, medication(s)/side effects and non-pharmacologic comfort measures Outcome: Progressing   Problem: Health Behavior/Discharge Planning: Goal: Ability to manage health-related needs will improve Outcome: Progressing   Problem: Clinical Measurements: Goal: Ability to maintain clinical measurements within normal limits will improve Outcome: Progressing Goal: Will remain free from infection Outcome: Progressing Goal: Diagnostic test results will improve Outcome: Progressing Goal: Respiratory complications will improve Outcome: Progressing Goal: Cardiovascular complication will be avoided Outcome: Progressing   Problem: Activity: Goal: Risk for activity intolerance will decrease Outcome: Progressing   Problem: Nutrition: Goal: Adequate nutrition will be maintained Outcome: Progressing   Problem: Coping: Goal: Level of anxiety will decrease Outcome: Progressing   Problem:  Elimination: Goal: Will not experience complications related to bowel motility Outcome: Progressing Goal: Will not experience complications related to urinary retention Outcome: Progressing   Problem: Pain Managment: Goal: General experience of comfort will improve Outcome: Progressing   Problem: Safety: Goal: Ability to remain free from injury will improve Outcome: Progressing   Problem: Skin Integrity: Goal: Risk for impaired skin integrity will decrease Outcome: Progressing

## 2023-02-14 NOTE — Care Plan (Signed)
RN Clinical research associate observed patient's ABD distended. Has not had a BM since 7/2. Patient also experiencing urinary retention. T. Dawley, DO contacted via phone. Verbal order received for in and out cath x1 and resume trial of void. Also advised to give PRN Dulcolax for constipation. Suppository given and in and out cath tried x1. Investment banker, operational unsuccessful. Endorsed to oncoming nurse Darin Engels, LPN).

## 2023-02-14 NOTE — Progress Notes (Signed)
Physical Therapy Treatment Patient Details Name: Eddie Ryan MRN: [id number] DOB: 1956-07-02 Today's Date: 02/14/2023   History of Present Illness The pt is a 67 yo male presenting 7/3 for PLIF of L3-4 and L4-5 due to chronic back and RLE pain. PMH includes: anxiety, arthritis, DM II, DVT, HTN, Lupus, Lymphoma, sleep apnea, and cervical fusion.    PT Comments  Pt received in supine and agreeable to session. Pt able to recall 3/3 back precautions, but is unable to recall logroll technique from previous session. Pt able to sit to EOB with min A, however requires increased assist to return to supine for BLE elevation to EOB. Pt able to assist with donning TLSO sitting EOB. Pt demonstrating increased difficulty standing from elevated EOB due to L hip pain requiring mod A and increased elevation. Pt requesting to use the bathroom and is able to stand at the toilet for increased time for urination. Pt reporting that he still feels like he needs to urinate and his stomach "feels like a basketball", RN notified. Pt demonstrating very slow, antalgic gait with increased B knee flexion with increased fatigue. Pt continues to benefit from PT services to progress toward functional mobility goals.      Assistance Recommended at Discharge Frequent or constant Supervision/Assistance  If plan is discharge home, recommend the following:  Can travel by private vehicle    Assistance with cooking/housework;Assist for transportation;Help with stairs or ramp for entrance;A lot of help with walking and/or transfers;A lot of help with bathing/dressing/bathroom      Equipment Recommendations  Rolling walker (2 wheels)    Recommendations for Other Services       Precautions / Restrictions Precautions Precautions: Back Precaution Booklet Issued: Yes (comment) Precaution Comments: Pt able to recall 3/3 Required Braces or Orthoses: Spinal Brace Spinal Brace: Thoracolumbosacral orthotic;Applied in sitting  position Restrictions Weight Bearing Restrictions: No     Mobility  Bed Mobility Overal bed mobility: Needs Assistance Bed Mobility: Sidelying to Sit, Rolling, Sit to Sidelying Rolling: Min guard Sidelying to sit: Min assist     Sit to sidelying: Mod assist General bed mobility comments: Min A for log roll to sit EOB and mod A to elevate BLE to return to supine    Transfers Overall transfer level: Needs assistance Equipment used: Rolling walker (2 wheels) Transfers: Sit to/from Stand Sit to Stand: Mod assist, From elevated surface           General transfer comment: STS from EOB with mod A for power up and elevated EOB due to pain.    Ambulation/Gait Ambulation/Gait assistance: Min assist Gait Distance (Feet): 20 Feet Assistive device: Rolling walker (2 wheels) Gait Pattern/deviations: Step-through pattern, Decreased stride length, Knees buckling, Decreased weight shift to left, Knee flexed in stance - right, Knee flexed in stance - left Gait velocity: decreased     General Gait Details: Very slow step-through pattern. Pt with B knees flexed in stance despite cues requiring min A with increased fatigue. Pt reporting increased pain in posterior thigh and back with knee extension. Heavy reliance on BUE support       Balance Overall balance assessment: Needs assistance Sitting-balance support: No upper extremity supported, Feet supported Sitting balance-Leahy Scale: Good Sitting balance - Comments: sitting EOB   Standing balance support: Bilateral upper extremity supported, During functional activity, Reliant on assistive device for balance Standing balance-Leahy Scale: Poor Standing balance comment: with RW support  Cognition Arousal/Alertness: Awake/alert Behavior During Therapy: WFL for tasks assessed/performed Overall Cognitive Status: Within Functional Limits for tasks assessed                                           Exercises      General Comments        Pertinent Vitals/Pain Pain Assessment Pain Assessment: 0-10 Pain Score: 6  Pain Location: back, L hip Pain Descriptors / Indicators: Discomfort, Grimacing, Sharp Pain Intervention(s): Monitored during session, Repositioned     PT Goals (current goals can now be found in the care plan section) Acute Rehab PT Goals Patient Stated Goal: return home, be able to return to work eventually PT Goal Formulation: With patient Time For Goal Achievement: 02/26/23 Potential to Achieve Goals: Good Progress towards PT goals: Progressing toward goals    Frequency    Min 5X/week      PT Plan Current plan remains appropriate       AM-PAC PT "6 Clicks" Mobility   Outcome Measure  Help needed turning from your back to your side while in a flat bed without using bedrails?: A Little Help needed moving from lying on your back to sitting on the side of a flat bed without using bedrails?: A Little Help needed moving to and from a bed to a chair (including a wheelchair)?: A Lot Help needed standing up from a chair using your arms (e.g., wheelchair or bedside chair)?: A Lot Help needed to walk in hospital room?: A Little Help needed climbing 3-5 steps with a railing? : Total 6 Click Score: 14    End of Session Equipment Utilized During Treatment: Gait belt Activity Tolerance: Patient tolerated treatment well Patient left: in bed;with call bell/phone within reach Nurse Communication: Mobility status PT Visit Diagnosis: Other abnormalities of gait and mobility (R26.89);Muscle weakness (generalized) (M62.81);Pain;Unsteadiness on feet (R26.81);History of falling (Z91.81) Pain - Right/Left: Left Pain - part of body: Leg;Hip     Time: 9811-9147 PT Time Calculation (min) (ACUTE ONLY): 42 min  Charges:    $Gait Training: 23-37 mins $Therapeutic Activity: 8-22 mins PT General Charges $$ ACUTE PT VISIT: 1 Visit                      Johny Shock, PTA Acute Rehabilitation Services Secure Chat Preferred  [phone]    Johny Shock 02/14/2023, 4:07 PM

## 2023-02-14 NOTE — Evaluation (Signed)
Occupational Therapy Evaluation Patient Details Name: Eddie Ryan MRN: [id number] DOB: [birth year] Today's Date: 02/14/2023   History of Present Illness The pt is a 67 yo male presenting 7/3 for PLIF of L3-4 and L4-5 due to chronic back and RLE pain. PMH includes: anxiety, arthritis, DM II, DVT, HTN, Lupus, Lymphoma, sleep apnea, and cervical fusion.   Clinical Impression   Pt s/p above diagnosis. Pt in a lot of pain today, 7/10 at rest, increases with movement. Pt independent and working at baseline, lives alone, has a sister who can help intermittently. Pt currently limited by pain, back precautions, instructed on use of AD for LB dressing, requires a significant increase in time/effort for bed mobility and transfers. Pt would greatly benefit from continued skilled therapy to maximize function and return to PLOF, post acute intensive rehab >3hrs/day recommended to quickly improve as Pt is highly motivated and actively participates in therapy.       Recommendations for follow up therapy are one component of a multi-disciplinary discharge planning process, led by the attending physician.  Recommendations may be updated based on patient status, additional functional criteria and insurance authorization.   Assistance Recommended at Discharge Frequent or constant Supervision/Assistance  Patient can return home with the following A little help with walking and/or transfers;A little help with bathing/dressing/bathroom;Assistance with cooking/housework;Assist for transportation;Help with stairs or ramp for entrance    Functional Status Assessment  Patient has had a recent decline in their functional status and demonstrates the ability to make significant improvements in function in a reasonable and predictable amount of time.  Equipment Recommendations  Other (comment) (defer)    Recommendations for Other Services       Precautions / Restrictions Precautions Precautions: Back Precaution  Booklet Issued: Yes (comment) Precaution Comments: Pt able to recall 3/3 Required Braces or Orthoses: Spinal Brace Spinal Brace: Thoracolumbosacral orthotic;Applied in sitting position Restrictions Weight Bearing Restrictions: No      Mobility Bed Mobility Overal bed mobility: Needs Assistance Bed Mobility: Sidelying to Sit, Sit to Supine   Sidelying to sit: Mod assist, HOB elevated   Sit to supine: Mod assist   General bed mobility comments: mod A for bed moblity today, lots of pain, lots of increased time/effort    Transfers Overall transfer level: Needs assistance                 General transfer comment: did not attempt today, assisted to EOB, lots of pain and increased time/effort      Balance Overall balance assessment: Needs assistance Sitting-balance support: No upper extremity supported, Feet supported Sitting balance-Leahy Scale: Good Sitting balance - Comments: sitting EOB                                   ADL either performed or assessed with clinical judgement   ADL Overall ADL's : Needs assistance/impaired Eating/Feeding: Independent;Sitting   Grooming: Set up;Sitting   Upper Body Bathing: Minimal assistance;Sitting   Lower Body Bathing: Maximal assistance;Adhering to back precautions;Sitting/lateral leans;With adaptive equipment   Upper Body Dressing : Set up;Sitting   Lower Body Dressing: Maximal assistance;Adhering to back precautions;Sitting/lateral leans;With adaptive equipment   Toilet Transfer: Moderate assistance             General ADL Comments: very limited with LB ADLs, instructed on use of sock aide/reacher, able to perform with inreased time, lots of pain with sitting EOB and any movement.  Vision Baseline Vision/History: 1 Wears glasses Ability to See in Adequate Light: 0 Adequate Patient Visual Report: No change from baseline       Perception     Praxis      Pertinent Vitals/Pain Pain  Assessment Pain Assessment: 0-10 Pain Score: 7  Pain Location: back, L hip Pain Descriptors / Indicators: Discomfort, Grimacing, Sharp Pain Intervention(s): Monitored during session     Hand Dominance Right   Extremity/Trunk Assessment Upper Extremity Assessment Upper Extremity Assessment: Overall WFL for tasks assessed           Communication Communication Communication: No difficulties   Cognition Arousal/Alertness: Awake/alert Behavior During Therapy: WFL for tasks assessed/performed Overall Cognitive Status: Within Functional Limits for tasks assessed                                       General Comments  Pt attempted to use urinal at EOB, unable to after several minutes, states he has had issues with urination lately, nursing informed    Exercises     Shoulder Instructions      Home Living Family/patient expects to be discharged to:: Private residence Living Arrangements: Alone Available Help at Discharge: Friend(s) Type of Home: House Home Access: Stairs to enter Secretary/administrator of Steps: 3 Entrance Stairs-Rails: Right;Left;Can reach both Home Layout: Two level;1/2 bath on main level Alternate Level Stairs-Number of Steps: 3 + landing + 7 Alternate Level Stairs-Rails: Left;Right Bathroom Shower/Tub: Producer, television/film/video: Handicapped height Bathroom Accessibility: Yes How Accessible: Accessible via walker Home Equipment: Shower seat;Grab bars - tub/shower;BSC/3in1;Rolling Environmental consultant (2 wheels);Gilmer Mor - single point   Additional Comments: lives alone, sister can assist as needed      Prior Functioning/Environment Prior Level of Function : Independent/Modified Independent;Working/employed             Mobility Comments: independent ADLs Comments: works with heavy machinery        OT Problem List:        OT Treatment/Interventions:      OT Goals(Current goals can be found in the care plan section) Acute Rehab OT  Goals Patient Stated Goal: decrease pain and return home OT Goal Formulation: With patient Time For Goal Achievement: 02/28/23 Potential to Achieve Goals: Good  OT Frequency:      Co-evaluation              AM-PAC OT "6 Clicks" Daily Activity     Outcome Measure                 End of Session    Activity Tolerance:   Patient left:                     Time: 8119-1478 OT Time Calculation (min): 27 min Charges:  OT General Charges $OT Visit: 1 Visit OT Evaluation $OT Eval Moderate Complexity: 1 Mod OT Treatments $Self Care/Home Management : 8-22 mins  Eddie Ryan, OTR/L   Alexis Goodell 02/14/2023, 5:44 PM

## 2023-02-14 NOTE — Progress Notes (Signed)
   Providing Compassionate, Quality Care - Together  NEUROSURGERY PROGRESS NOTE   S: No issues overnight.  Remains with urinary retention  O: EXAM:  BP 120/72 (BP Location: Left Arm)   Pulse 95   Temp 98.9 F (37.2 C) (Oral)   Resp 16   Ht 6\' 2"  (1.88 m)   Wt 103.3 kg   SpO2 (!) 86%   BMI 29.25 kg/m   Awake, alert, oriented x 3 PERRL Speech fluent, appropriate  CNs grossly intact  5/5 BUE/BLE  Wound clean dry and intact  ASSESSMENT:  67 y.o. male with   Status post L3-5 PLIF, postop day 3 Urinary retention  PLAN: -Continue Flomax, may need reinsertion of catheter if no improvement -Rehab pending    Thank you for allowing me to participate in this patient's care.  Please do not hesitate to call with questions or concerns.   Monia Pouch, DO Neurosurgeon [facility] Cell: [phone]

## 2023-02-14 NOTE — Progress Notes (Signed)
   02/14/23 2052  BiPAP/CPAP/SIPAP  Reason BIPAP/CPAP not in use Non-compliant   Pt states that he does wear a CPAP at home but does not want to use our machine here. I informed patient that he could have his family bring his machine here if he would like.   Nelda Marseille

## 2023-02-15 LAB — GLUCOSE, CAPILLARY
Glucose-Capillary: 110 mg/dL — ABNORMAL HIGH (ref 70–99)
Glucose-Capillary: 122 mg/dL — ABNORMAL HIGH (ref 70–99)
Glucose-Capillary: 125 mg/dL — ABNORMAL HIGH (ref 70–99)
Glucose-Capillary: 136 mg/dL — ABNORMAL HIGH (ref 70–99)
Glucose-Capillary: 78 mg/dL (ref 70–99)
Glucose-Capillary: 84 mg/dL (ref 70–99)

## 2023-02-15 NOTE — Progress Notes (Signed)
Day RN was unable to catherize pt after scanning bladder for 520 ml. I was able to catherize pt for about 1300 ml. Pt tolerated well and feeling much better.

## 2023-02-15 NOTE — Progress Notes (Signed)
Inpatient Rehab Admissions Coordinator:    CIR following. Case pending with insurance.   Megan Salon, MS, CCC-SLP Rehab Admissions Coordinator  [phone] (celll) [phone] (office)

## 2023-02-15 NOTE — Progress Notes (Signed)
   Providing Compassionate, Quality Care - Together  NEUROSURGERY PROGRESS NOTE   S: No issues overnight.   O: EXAM:  BP 101/73 (BP Location: Left Arm)   Pulse 81   Temp (!) 97.3 F (36.3 C) (Oral)   Resp 18   Ht 6\' 2"  (1.88 m)   Wt 103.3 kg   SpO2 95%   BMI 29.25 kg/m   Awake, alert, oriented x 3 PERRL Speech fluent, appropriate  CNs grossly intact  5/5 BUE/BLE  Wound clean dry and intact   ASSESSMENT:  67 y.o. male with    Status post L3-5 PLIF, postop day 3 Urinary retention   PLAN: -Continue Flomax, may need reinsertion of catheter if no improvement, needed cath x1 -possible dc tomorrow pending ileus and retention      Thank you for allowing me to participate in this patient's care.  Please do not hesitate to call with questions or concerns.   Monia Pouch, DO Neurosurgeon [facility] Cell: [phone]

## 2023-02-16 LAB — GLUCOSE, CAPILLARY
Glucose-Capillary: 113 mg/dL — ABNORMAL HIGH (ref 70–99)
Glucose-Capillary: 145 mg/dL — ABNORMAL HIGH (ref 70–99)
Glucose-Capillary: 155 mg/dL — ABNORMAL HIGH (ref 70–99)
Glucose-Capillary: 71 mg/dL (ref 70–99)
Glucose-Capillary: 89 mg/dL (ref 70–99)
Glucose-Capillary: 97 mg/dL (ref 70–99)
Glucose-Capillary: 99 mg/dL (ref 70–99)

## 2023-02-16 MED ORDER — WARFARIN - PHARMACIST DOSING INPATIENT: Freq: Every day | Status: DC

## 2023-02-16 MED ORDER — WARFARIN SODIUM 7.5 MG PO TABS
7.5000 mg | ORAL_TABLET | Freq: Once | ORAL | Status: AC
  Administered 2023-02-16: 7.5 mg via ORAL
  Filled 2023-02-16: qty 1

## 2023-02-16 NOTE — Progress Notes (Addendum)
Inpatient Rehab Admissions Coordinator:    CIR following. Insurance remains pending. I  spoke with Pt. Who states his girlfriend may be able to come stay with Pt. After CIR and he will discuss this with her.   Megan Salon, MS, CCC-SLP Rehab Admissions Coordinator  [phone] (celll) [phone] (office)

## 2023-02-16 NOTE — Progress Notes (Signed)
ANTICOAGULATION CONSULT NOTE - Initial Consult  Pharmacy Consult for warfarin Indication:  lupus anticoagulant, hx DVT  Allergies  Allergen Reactions   Losartan Potassium-Hctz Other (See Comments)    Unknown reaction.   Morphine And Codeine Itching    WITH PROLONGED USE    Patient Measurements: Height: 6\' 2"  (188 cm) Weight: 103.3 kg (227 lb 12.8 oz) IBW/kg (Calculated) : 82.2  Vital Signs: Temp: 97.9 F (36.6 C) (07/08 0752) Temp Source: Oral (07/08 0752) BP: 100/70 (07/08 0752) Pulse Rate: 71 (07/08 0752)  Labs: No results for input(s): "HGB", "HCT", "PLT", "APTT", "LABPROT", "INR", "HEPARINUNFRC", "HEPRLOWMOCWT", "CREATININE", "CKTOTAL", "CKMB", "TROPONINIHS" in the last 72 hours.  CrCl cannot be calculated (Patient's most recent lab result is older than the maximum 21 days allowed.).   Medical History: Past Medical History:  Diagnosis Date   Anxiety    Arthritis    Collagen vascular disease (HCC)    Depression    Diabetes mellitus without complication (HCC)    DVT (deep venous thrombosis) (HCC)    Headache    occasionally   History of kidney stones    Hypertension    Lupus anticoagulant disorder (HCC)    Lymphoma (HCC) 07/2020   Sleep apnea    Umbilical hernia     Medications:  Medications Prior to Admission  Medication Sig Dispense Refill Last Dose   ALPRAZolam (XANAX) 0.5 MG tablet Take 0.5 mg by mouth in the morning and at bedtime.    02/11/2023   buPROPion (WELLBUTRIN XL) 150 MG 24 hr tablet Take 300 mg by mouth daily.    02/11/2023   docusate sodium (COLACE) 100 MG capsule Take 200 mg by mouth daily.   02/10/2023   ergocalciferol (VITAMIN D2) 1.25 MG (50000 UT) capsule Take 50,000 Units by mouth every Monday.    Past Week   escitalopram (LEXAPRO) 20 MG tablet Take 20 mg by mouth daily.    02/11/2023   etodolac (LODINE) 400 MG tablet Take 400 mg by mouth daily.   02/10/2023   glipiZIDE (GLUCOTROL XL) 10 MG 24 hr tablet Take 10 mg by mouth daily with breakfast.    02/10/2023   loratadine (CLARITIN) 10 MG tablet Take 10 mg by mouth daily.    02/10/2023   Magnesium 500 MG TABS Take 1,000 mg by mouth daily.   02/10/2023   oxyCODONE-acetaminophen (PERCOCET) 5-325 MG tablet Take 1-2 tablets by mouth every 4 (four) hours as needed for moderate pain or severe pain. 20 tablet 0 02/10/2023   rosuvastatin (CRESTOR) 10 MG tablet Take 10 mg by mouth at bedtime.   02/10/2023   telmisartan (MICARDIS) 80 MG tablet Take 40 mg by mouth every evening.    Past Week   warfarin (COUMADIN) 5 MG tablet Take 5 mg by mouth See admin instructions. Take 7.5 mg by mouth on Saturday evening & take 5 mg on Sunday, Monday, Tuesday, Wednesday, Thursday & Friday evening.   02/04/2023   diclofenac Sodium (VOLTAREN) 1 % GEL Apply 1 g topically 4 (four) times daily as needed (thumb as needed for pain.).    More than a month    Assessment:  66 yom on warfarin PTA for lupus anticoagulant and hx DVT. He is s/p L3-5 PLIF on 7/3. Pharmacy consulted to resume warfarin post-op. No bleeding noted, last CBC was normal 11/04/22.  Goal of Therapy:  INR 2-3 Monitor platelets by anticoagulation protocol: Yes   Plan:  Warfarin 7.5 mg PO tonight INR daily, CBC in am Monitor for  s/sx of bleeding  Thank you for involving pharmacy in this patient's care.  Loura Back, PharmD, BCPS Clinical Pharmacist Clinical phone for 02/16/2023 is [phone] 02/16/2023 8:21 AM

## 2023-02-16 NOTE — Care Management Important Message (Signed)
Important Message  Patient Details  Name: LAIDEN KANIECKI MRN: [id number] Date of Birth: 10-18-1955   Medicare Important Message Given:  Yes     Sherilyn Banker 02/16/2023, 3:43 PM

## 2023-02-16 NOTE — Progress Notes (Signed)
Subjective: The patient is alert and pleasant.  His back pain is improving.  His urinary retention has resolved.  Objective: Vital signs in last 24 hours: Temp:  [98.1 F (36.7 C)-98.6 F (37 C)] 98.6 F (37 C) (07/07 1938) Pulse Rate:  [83-86] 83 (07/07 1938) Resp:  [18-19] 18 (07/08 0422) BP: (96-101)/(63-68) 96/67 (07/08 0422) SpO2:  [89 %-94 %] 89 % (07/07 1938) Estimated body mass index is 29.25 kg/m as calculated from the following:   Height as of this encounter: 6\' 2"  (1.88 m).   Weight as of this encounter: 103.3 kg.   Intake/Output from previous day: 07/07 0701 - 07/08 0700 In: -  Out: 1680 [Urine:1680] Intake/Output this shift: No intake/output data recorded.  Physical exam the patient is alert and pleasant.  His lower extremity strength is normal.  His wound is healing well.  I remove the dressing.  Lab Results: No results for input(s): "WBC", "HGB", "HCT", "PLT" in the last 72 hours. BMET No results for input(s): "NA", "K", "CL", "CO2", "GLUCOSE", "BUN", "CREATININE", "CALCIUM" in the last 72 hours.  Studies/Results: No results found.  Assessment/Plan: Postop day #5: We are awaiting rehab placement.  I will restart him his Coumadin.  LOS: 3 days     Cristi Loron 02/16/2023, 7:52 AM     Patient ID: Eddie Ryan, male   DOB: Dec 14, 1955, 67 y.o.   MRN: [id number]

## 2023-02-16 NOTE — Progress Notes (Signed)
CBG 71 2 cups of orange juice given to patient, will recheck blood sugar.

## 2023-02-16 NOTE — Progress Notes (Signed)
Physical Therapy Treatment Patient Details Name: Eddie Ryan MRN: [id number] DOB: May 24, 1956 Today's Date: 02/16/2023   History of Present Illness The pt is a 67 yo male presenting 7/3 for PLIF of L3-4 and L4-5 due to chronic back and RLE pain. PMH includes: anxiety, arthritis, DM II, DVT, HTN, Lupus, Lymphoma, sleep apnea, and cervical fusion.    PT Comments  The pt was agreeable to session, reports continued pain in L hip which he feels is worsening each day. No significant loss of strength, but testing limited by pt's pain. The pt also reports continued abdominal fullness and need to use bathroom but that little-none comes out when he tries to go. NT alerted of this complaint. The pt was able to progress ambulation distance slightly this session, had fewer overt buckling of L leg, but continues to need modA to maintain balance and BUE support on RW. Will continue to benefit from intensive therapies for targeted strengthening, endurance training, balance training, and gait training to progress back to independence prior to return home.     Assistance Recommended at Discharge Frequent or constant Supervision/Assistance  If plan is discharge home, recommend the following:  Can travel by private vehicle    Assistance with cooking/housework;Assist for transportation;Help with stairs or ramp for entrance;A lot of help with walking and/or transfers;A lot of help with bathing/dressing/bathroom      Equipment Recommendations  Rolling walker (2 wheels)    Recommendations for Other Services       Precautions / Restrictions Precautions Precautions: Back Precaution Booklet Issued: Yes (comment) Precaution Comments: pt recalled 3/3/ Required Braces or Orthoses: Spinal Brace Spinal Brace: Thoracolumbosacral orthotic;Applied in sitting position Restrictions Weight Bearing Restrictions: No     Mobility  Bed Mobility               General bed mobility comments: pt OOB in recliner at  start and end of session    Transfers Overall transfer level: Needs assistance Equipment used: Rolling walker (2 wheels) Transfers: Sit to/from Stand Sit to Stand: Mod assist           General transfer comment: modA to rise with repeated cues for hand placement, maintains limited wt on LLE and knees flexed. Pt slow to rise and needing modA to steady as he rises    Ambulation/Gait Ambulation/Gait assistance: Min assist, Mod assist Gait Distance (Feet): 45 Feet Assistive device: Rolling walker (2 wheels) Gait Pattern/deviations: Step-through pattern, Decreased stride length, Knees buckling, Decreased weight shift to left, Knee flexed in stance - right, Knee flexed in stance - left, Shuffle Gait velocity: decreased Gait velocity interpretation: <1.31 ft/sec, indicative of household ambulator   General Gait Details: pt with intermittent knee bucling LLE, pt also reporting L hip pain. continues to ambulate on lateral aspect of L foot. SpO2 stable  on RA with gait today   Stairs             Wheelchair Mobility     Tilt Bed    Modified Rankin (Stroke Patients Only)       Balance Overall balance assessment: Needs assistance Sitting-balance support: No upper extremity supported, Feet supported Sitting balance-Leahy Scale: Good     Standing balance support: Bilateral upper extremity supported, During functional activity Standing balance-Leahy Scale: Poor Standing balance comment: BUE support and minA to maintain static stand, modA for mobility, frequent knee bucklking  Cognition Arousal/Alertness: Awake/alert Behavior During Therapy: WFL for tasks assessed/performed Overall Cognitive Status: Impaired/Different from baseline Area of Impairment: Safety/judgement, Problem solving                         Safety/Judgement: Decreased awareness of safety, Decreased awareness of deficits   Problem Solving: Requires verbal  cues General Comments: pt with better recall of precautions, cues for safety and movement        Exercises Other Exercises Other Exercises: toe taps to 4" tall item, increased time, effort, and dysmetria with LLE    General Comments General comments (skin integrity, edema, etc.): VSS on RA, pt complains of abdominal fullness, reports he feels like he needs to use bathroom but has only gone a little at a time and never feels like bladder is emptying. NT alerted for possible bladder scan      Pertinent Vitals/Pain Pain Assessment Pain Assessment: Faces Faces Pain Scale: Hurts whole lot Pain Location: L hip (pt reports progressively worse over weekend) Pain Descriptors / Indicators: Discomfort, Grimacing, Sharp Pain Intervention(s): Monitored during session, Limited activity within patient's tolerance, Repositioned     PT Goals (current goals can now be found in the care plan section) Acute Rehab PT Goals Patient Stated Goal: return home, be able to return to work eventually PT Goal Formulation: With patient Time For Goal Achievement: 02/26/23 Potential to Achieve Goals: Good Progress towards PT goals: Progressing toward goals    Frequency    Min 5X/week      PT Plan Current plan remains appropriate       AM-PAC PT "6 Clicks" Mobility   Outcome Measure  Help needed turning from your back to your side while in a flat bed without using bedrails?: A Little Help needed moving from lying on your back to sitting on the side of a flat bed without using bedrails?: A Little Help needed moving to and from a bed to a chair (including a wheelchair)?: A Lot Help needed standing up from a chair using your arms (e.g., wheelchair or bedside chair)?: A Lot Help needed to walk in hospital room?: A Lot Help needed climbing 3-5 steps with a railing? : Total 6 Click Score: 13    End of Session Equipment Utilized During Treatment: Gait belt Activity Tolerance: Patient tolerated  treatment well Patient left: in chair;with call bell/phone within reach;with chair alarm set Nurse Communication: Mobility status PT Visit Diagnosis: Other abnormalities of gait and mobility (R26.89);Muscle weakness (generalized) (M62.81);Pain;Unsteadiness on feet (R26.81);History of falling (Z91.81) Pain - Right/Left: Left Pain - part of body: Leg;Hip     Time: 0981-1914 PT Time Calculation (min) (ACUTE ONLY): 23 min  Charges:    $Gait Training: 8-22 mins $Therapeutic Activity: 8-22 mins PT General Charges $$ ACUTE PT VISIT: 1 Visit                     Vickki Muff, PT, DPT   Acute Rehabilitation Department Office [phone] Secure Chat Communication Preferred   Ronnie Derby 02/16/2023, 1:35 PM

## 2023-02-17 LAB — CBC
HCT: 31.6 % — ABNORMAL LOW (ref 39.0–52.0)
Hemoglobin: 10.4 g/dL — ABNORMAL LOW (ref 13.0–17.0)
MCH: 28.7 pg (ref 26.0–34.0)
MCHC: 32.9 g/dL (ref 30.0–36.0)
MCV: 87.3 fL (ref 80.0–100.0)
Platelets: 247 10*3/uL (ref 150–400)
RBC: 3.62 MIL/uL — ABNORMAL LOW (ref 4.22–5.81)
RDW: 14.1 % (ref 11.5–15.5)
WBC: 5.2 10*3/uL (ref 4.0–10.5)
nRBC: 0 % (ref 0.0–0.2)

## 2023-02-17 LAB — GLUCOSE, CAPILLARY
Glucose-Capillary: 103 mg/dL — ABNORMAL HIGH (ref 70–99)
Glucose-Capillary: 123 mg/dL — ABNORMAL HIGH (ref 70–99)
Glucose-Capillary: 127 mg/dL — ABNORMAL HIGH (ref 70–99)
Glucose-Capillary: 141 mg/dL — ABNORMAL HIGH (ref 70–99)
Glucose-Capillary: 92 mg/dL (ref 70–99)
Glucose-Capillary: 98 mg/dL (ref 70–99)

## 2023-02-17 LAB — PROTIME-INR
INR: 1.2 (ref 0.8–1.2)
Prothrombin Time: 14.9 seconds (ref 11.4–15.2)

## 2023-02-17 MED ORDER — WARFARIN SODIUM 7.5 MG PO TABS
7.5000 mg | ORAL_TABLET | Freq: Once | ORAL | Status: AC
  Administered 2023-02-17: 7.5 mg via ORAL
  Filled 2023-02-17: qty 1

## 2023-02-17 NOTE — Progress Notes (Signed)
Physical Therapy Treatment Patient Details Name: Eddie Ryan MRN: [id number] DOB: 1956/03/14 Today's Date: 02/17/2023   History of Present Illness The pt is a 67 yo male presenting 7/3 for PLIF of L3-4 and L4-5 due to chronic back and RLE pain. PMH includes: anxiety, arthritis, DM II, DVT, HTN, Lupus, Lymphoma, sleep apnea, and cervical fusion.    PT Comments  Pt progressing well with mobility, ambulating good hallway distance with standing rest as needed and occasional steadying assist given LLE weakness and pain. AIR denied by insurance, plan for d/c to less intensive inpatient therapy post-acutely.     Assistance Recommended at Discharge Frequent or constant Supervision/Assistance  If plan is discharge home, recommend the following:  Can travel by private vehicle    Assistance with cooking/housework;Assist for transportation;Help with stairs or ramp for entrance;A little help with walking and/or transfers;A little help with bathing/dressing/bathroom      Equipment Recommendations  Rolling walker (2 wheels)    Recommendations for Other Services       Precautions / Restrictions Precautions Precautions: Back Precaution Booklet Issued: Yes (comment) Precaution Comments: pt recalled 3/3/ Required Braces or Orthoses: Spinal Brace Spinal Brace: Thoracolumbosacral orthotic;Applied in sitting position Restrictions Weight Bearing Restrictions: No     Mobility  Bed Mobility Overal bed mobility: Needs Assistance Bed Mobility: Sit to Supine       Sit to supine: Min assist, HOB elevated   General bed mobility comments: assist for LEs into bed, good maintenance of back precautions    Transfers Overall transfer level: Needs assistance Equipment used: Rolling walker (2 wheels) Transfers: Sit to/from Stand Sit to Stand: Supervision           General transfer comment: for safety    Ambulation/Gait Ambulation/Gait assistance: Min assist Gait Distance (Feet): 100 Feet  (x2 - standing rest break) Assistive device: Rolling walker (2 wheels) Gait Pattern/deviations: Step-through pattern, Decreased stride length, Knees buckling, Decreased weight shift to left, Knee flexed in stance - right, Knee flexed in stance - left, Shuffle Gait velocity: decr     General Gait Details: occasional steadying assist, pt with LLE buckling and sharp pains intermittently   Stairs             Wheelchair Mobility     Tilt Bed    Modified Rankin (Stroke Patients Only)       Balance Overall balance assessment: Needs assistance Sitting-balance support: No upper extremity supported, Feet supported Sitting balance-Leahy Scale: Good Sitting balance - Comments: sitting EOB   Standing balance support: Bilateral upper extremity supported, During functional activity Standing balance-Leahy Scale: Fair                              Cognition Arousal/Alertness: Awake/alert Behavior During Therapy: WFL for tasks assessed/performed Overall Cognitive Status: Within Functional Limits for tasks assessed                                          Exercises General Exercises - Lower Extremity Short Arc Quad: AROM, Both, Supine    General Comments        Pertinent Vitals/Pain Pain Assessment Pain Assessment: Faces Faces Pain Scale: Hurts little more Pain Location: LLE Pain Descriptors / Indicators: Discomfort, Grimacing, Sharp Pain Intervention(s): Limited activity within patient's tolerance, Monitored during session, Repositioned    Home Living  Prior Function            PT Goals (current goals can now be found in the care plan section) Acute Rehab PT Goals Patient Stated Goal: return home, be able to return to work eventually PT Goal Formulation: With patient Time For Goal Achievement: 02/26/23 Potential to Achieve Goals: Good Progress towards PT goals: Progressing toward goals     Frequency    Min 5X/week      PT Plan Current plan remains appropriate    Co-evaluation              AM-PAC PT "6 Clicks" Mobility   Outcome Measure  Help needed turning from your back to your side while in a flat bed without using bedrails?: A Little Help needed moving from lying on your back to sitting on the side of a flat bed without using bedrails?: A Little Help needed moving to and from a bed to a chair (including a wheelchair)?: A Little Help needed standing up from a chair using your arms (e.g., wheelchair or bedside chair)?: A Little Help needed to walk in hospital room?: A Little Help needed climbing 3-5 steps with a railing? : A Lot 6 Click Score: 17    End of Session Equipment Utilized During Treatment: Back brace Activity Tolerance: Patient tolerated treatment well Patient left: in bed;with call bell/phone within reach;with bed alarm set Nurse Communication: Mobility status PT Visit Diagnosis: Other abnormalities of gait and mobility (R26.89);Muscle weakness (generalized) (M62.81);Pain;Unsteadiness on feet (R26.81);History of falling (Z91.81) Pain - Right/Left: Left Pain - part of body: Leg;Hip     Time: 1421-1440 PT Time Calculation (min) (ACUTE ONLY): 19 min  Charges:    $Therapeutic Activity: 8-22 mins PT General Charges $$ ACUTE PT VISIT: 1 Visit                     Marye Round, PT DPT Acute Rehabilitation Services Secure Chat Preferred  Office [phone]    Meggan Dhaliwal Sheliah Plane 02/17/2023, 4:27 PM

## 2023-02-17 NOTE — Progress Notes (Signed)
Occupational Therapy Treatment Patient Details Name: BRYLAND RULLAN MRN: [id number] DOB: [birth year] Today's Date: 02/17/2023   History of present illness The pt is a 67 yo male presenting 7/3 for PLIF of L3-4 and L4-5 due to chronic back and RLE pain. PMH includes: anxiety, arthritis, DM II, DVT, HTN, Lupus, Lymphoma, sleep apnea, and cervical fusion.   OT comments  Pt doing well today, motivated to participate and improve functional strength. Pt able to perform bed mobility with increased time, HOB elevated, mod I. Pt supervision for STS from elevated surface, able to ambulate to toilet supervision with RW. Pt able to stand and perform toileting, manage clothing, not able to bend down to lift toilet seat. Pt is able to stand at sink performing hand hygiene unsupported, fair balance but cannot tolerate challenge. Pt would benefit greatly from continued skilled therapy to improve functional strength and independence with ADLs, post acute rehab <3 hours/day recommended.    Recommendations for follow up therapy are one component of a multi-disciplinary discharge planning process, led by the attending physician.  Recommendations may be updated based on patient status, additional functional criteria and insurance authorization.    Assistance Recommended at Discharge Frequent or constant Supervision/Assistance  Patient can return home with the following  A little help with walking and/or transfers;A little help with bathing/dressing/bathroom;Assistance with cooking/housework;Assist for transportation;Help with stairs or ramp for entrance   Equipment Recommendations  Other (comment) (defer)    Recommendations for Other Services      Precautions / Restrictions Precautions Precautions: Back Precaution Booklet Issued: Yes (comment) Precaution Comments: pt recalled 3/3/ Required Braces or Orthoses: Spinal Brace Spinal Brace: Thoracolumbosacral orthotic;Applied in sitting position Restrictions Weight  Bearing Restrictions: No       Mobility Bed Mobility Overal bed mobility: Modified Independent             General bed mobility comments: increased time, HOB elevated    Transfers Overall transfer level: Needs assistance Equipment used: Rolling walker (2 wheels) Transfers: Sit to/from Stand Sit to Stand: Supervision, From elevated surface           General transfer comment: elevated surface with RW     Balance Overall balance assessment: Needs assistance Sitting-balance support: No upper extremity supported, Feet supported Sitting balance-Leahy Scale: Good Sitting balance - Comments: sitting EOB   Standing balance support: Bilateral upper extremity supported, During functional activity Standing balance-Leahy Scale: Fair Standing balance comment: able to stand at sink performing hand hygiene unsupport, not able to tolerate challenge to balance                           ADL either performed or assessed with clinical judgement   ADL Overall ADL's : Needs assistance/impaired Eating/Feeding: Independent;Sitting   Grooming: Supervision/safety;Standing                   Toilet Transfer: Supervision/safety;Rolling walker (2 wheels)   Toileting- Clothing Manipulation and Hygiene: Supervision/safety;Sit to/from stand       Functional mobility during ADLs: Supervision/safety General ADL Comments: Pt supervision with RW for transfers, able to ambulate to toilet, perform hand hygiene and grooming standing at sink.    Extremity/Trunk Assessment              Vision       Perception     Praxis      Cognition Arousal/Alertness: Awake/alert Behavior During Therapy: WFL for tasks assessed/performed Overall Cognitive Status: Within Functional Limits  for tasks assessed                                 General Comments: good safety awareness and recall today        Exercises      Shoulder Instructions       General  Comments      Pertinent Vitals/ Pain       Pain Assessment Pain Assessment: 0-10 Pain Score: 3  Faces Pain Scale: Hurts little more Pain Location: L hip (pt reports progressively worse over weekend) Pain Descriptors / Indicators: Discomfort, Grimacing, Sharp Pain Intervention(s): Monitored during session  Home Living                                          Prior Functioning/Environment              Frequency  Min 2X/week        Progress Toward Goals  OT Goals(current goals can now be found in the care plan section)  Progress towards OT goals: Progressing toward goals  Acute Rehab OT Goals Patient Stated Goal: to improve balance and strength OT Goal Formulation: With patient Time For Goal Achievement: 02/28/23 Potential to Achieve Goals: Good ADL Goals Pt Will Perform Lower Body Dressing: with set-up;sit to/from stand;with adaptive equipment Pt Will Transfer to Toilet: with supervision;ambulating Pt Will Perform Toileting - Clothing Manipulation and hygiene: with modified independence;sitting/lateral leans Additional ADL Goal #1: Pt will be able to perform bed mobility with mod I while maintaining back precautions, utilizing log roll technique  Plan Discharge plan needs to be updated    Co-evaluation                 AM-PAC OT "6 Clicks" Daily Activity     Outcome Measure   Help from another person eating meals?: None Help from another person taking care of personal grooming?: A Little Help from another person toileting, which includes using toliet, bedpan, or urinal?: A Little Help from another person bathing (including washing, rinsing, drying)?: A Lot Help from another person to put on and taking off regular upper body clothing?: A Little Help from another person to put on and taking off regular lower body clothing?: A Lot 6 Click Score: 17    End of Session Equipment Utilized During Treatment: Rolling walker (2 wheels)  OT Visit  Diagnosis: Unsteadiness on feet (R26.81);Other abnormalities of gait and mobility (R26.89);Muscle weakness (generalized) (M62.81);Pain Pain - part of body:  (back)   Activity Tolerance Patient tolerated treatment well   Patient Left in bed;with call bell/phone within reach;Other (comment) (PT took over at end of session)   Nurse Communication Mobility status        Time: 0454-0981 OT Time Calculation (min): 16 min  Charges: OT General Charges $OT Visit: 1 Visit OT Treatments $Self Care/Home Management : 8-22 mins  [address] OTR/L   Alexis Goodell 02/17/2023, 2:28 PM

## 2023-02-17 NOTE — Progress Notes (Signed)
Subjective: The patient is alert and pleasant.  He looks and feels a bit better today.  He is still slow to mobilize.  He is urinating normally.  Objective: Vital signs in last 24 hours: Temp:  [97.6 F (36.4 C)-98.5 F (36.9 C)] 97.9 F (36.6 C) (07/09 0744) Pulse Rate:  [66-81] 66 (07/09 0744) Resp:  [19] 19 (07/09 0510) BP: (100-132)/(70-84) 122/80 (07/09 0744) SpO2:  [91 %-96 %] 96 % (07/09 0744) Estimated body mass index is 29.25 kg/m as calculated from the following:   Height as of this encounter: 6\' 2"  (1.88 m).   Weight as of this encounter: 103.3 kg.   Intake/Output from previous day: 07/08 0701 - 07/09 0700 In: -  Out: 2000 [Urine:2000] Intake/Output this shift: No intake/output data recorded.  Physical exam patient is alert and pleasant.  His strength is normal.  Lab Results: Recent Labs    02/17/23 0448  WBC 5.2  HGB 10.4*  HCT 31.6*  PLT 247   BMET No results for input(s): "NA", "K", "CL", "CO2", "GLUCOSE", "BUN", "CREATININE", "CALCIUM" in the last 72 hours.  Studies/Results: No results found.  Assessment/Plan: Postop day #6: Hopefully he will get into rehab today.  LOS: 4 days     Cristi Loron 02/17/2023, 7:45 AM     Patient ID: Eddie Ryan, male   DOB: [birth year], 67 y.o.   MRN: [id number]

## 2023-02-17 NOTE — Progress Notes (Signed)
PT Cancellation Note  Patient Details Name: Eddie Ryan MRN: [id number] DOB: 10-12-1955   Cancelled Treatment:    Reason Eval/Treat Not Completed: Other (comment) - pt up with OT upon arrival to room, will check back later.   Marye Round, PT DPT Acute Rehabilitation Services Secure Chat Preferred  Office [phone]    [name] 02/17/2023, 2:16 PM

## 2023-02-17 NOTE — Progress Notes (Signed)
Inpatient Rehab Admissions Coordinator:   Insurance has denied CIR. Pt. Does not wish to appeal. Would like SNF in [place]. I will sign off and notify TOC.   Megan Salon, MS, CCC-SLP Rehab Admissions Coordinator  [phone] (celll) [phone] (office)

## 2023-02-17 NOTE — Progress Notes (Addendum)
ANTICOAGULATION CONSULT NOTE - Follow Up Consult  Pharmacy Consult for warfarin Indication:  lupus anticoagulation, hx DVT  Allergies  Allergen Reactions   Losartan Potassium-Hctz Other (See Comments)    Unknown reaction.   Morphine And Codeine Itching    WITH PROLONGED USE    Patient Measurements: Height: 6\' 2"  (188 cm) Weight: 103.3 kg (227 lb 12.8 oz) IBW/kg (Calculated) : 82.2  Vital Signs: Temp: 97.9 F (36.6 C) (07/09 0744) Temp Source: Oral (07/09 0744) BP: 122/80 (07/09 0744) Pulse Rate: 66 (07/09 0744)  Labs: Recent Labs    02/17/23 0448  HGB 10.4*  HCT 31.6*  PLT 247  LABPROT 14.9  INR 1.2    CrCl cannot be calculated (Patient's most recent lab result is older than the maximum 21 days allowed.).   Medications:  Scheduled:   buPROPion  300 mg Oral Daily   docusate sodium  100 mg Oral BID   escitalopram  20 mg Oral Daily   glipiZIDE  10 mg Oral Q breakfast   insulin aspart  0-20 Units Subcutaneous Q4H   irbesartan  300 mg Oral Daily   loratadine  10 mg Oral Daily   rosuvastatin  10 mg Oral QHS   sodium chloride flush  3 mL Intravenous Q12H   tamsulosin  0.4 mg Oral Daily   Warfarin - Pharmacist Dosing Inpatient   Does not apply q1600    Assessment: Mr. Zeitlin is a 34 YOM on warfarin PTA for lupus and hx DVT. He is s/p L3-5 PLIF on 7/3. Last dose of warfarin prior to the procedure was 6/26 and was bridged with lovenox prior to the procedure. Warfarin was resumed post-op on 7/8. Warfarin weekly home dose was 37.5 mg (5 mg Sunday-Friday and 7.5 mg Saturday) prior to the procedure. No bleeding noted and CBC normal. INR subtherapeutic (1.2) on 7/9.   Goal of Therapy:  INR 2-3 Monitor platelets by anticoagulation protocol: Yes   Plan:  Warfarin 7.5 mg PO tonight Monitor INR and CBC daily Monitor for s/sx of bleeding  Enos Fling, PharmD PGY-1 Acute Care Pharmacy Resident 02/17/2023 10:36 AM

## 2023-02-18 LAB — CBC
HCT: 35.2 % — ABNORMAL LOW (ref 39.0–52.0)
Hemoglobin: 11.2 g/dL — ABNORMAL LOW (ref 13.0–17.0)
MCH: 27.9 pg (ref 26.0–34.0)
MCHC: 31.8 g/dL (ref 30.0–36.0)
MCV: 87.6 fL (ref 80.0–100.0)
Platelets: 301 10*3/uL (ref 150–400)
RBC: 4.02 MIL/uL — ABNORMAL LOW (ref 4.22–5.81)
RDW: 14.3 % (ref 11.5–15.5)
WBC: 6.6 10*3/uL (ref 4.0–10.5)
nRBC: 0 % (ref 0.0–0.2)

## 2023-02-18 LAB — PROTIME-INR
INR: 1.3 — ABNORMAL HIGH (ref 0.8–1.2)
Prothrombin Time: 16 seconds — ABNORMAL HIGH (ref 11.4–15.2)

## 2023-02-18 LAB — GLUCOSE, CAPILLARY
Glucose-Capillary: 110 mg/dL — ABNORMAL HIGH (ref 70–99)
Glucose-Capillary: 120 mg/dL — ABNORMAL HIGH (ref 70–99)
Glucose-Capillary: 134 mg/dL — ABNORMAL HIGH (ref 70–99)
Glucose-Capillary: 90 mg/dL (ref 70–99)

## 2023-02-18 MED ORDER — OXYCODONE-ACETAMINOPHEN 5-325 MG PO TABS: 1.0000 | ORAL_TABLET | ORAL | 0 refills | Status: DC | PRN

## 2023-02-18 MED ORDER — CYCLOBENZAPRINE HCL 10 MG PO TABS: 10.0000 mg | ORAL_TABLET | Freq: Three times a day (TID) | ORAL | 0 refills | Status: DC | PRN

## 2023-02-18 MED ORDER — WARFARIN SODIUM 5 MG PO TABS: 5.0000 mg | ORAL_TABLET | Freq: Once | ORAL | Status: DC

## 2023-02-18 MED ORDER — OXYCODONE-ACETAMINOPHEN 5-325 MG PO TABS: 1.0000 | ORAL_TABLET | ORAL | Status: DC | PRN

## 2023-02-18 MED ORDER — TAMSULOSIN HCL 0.4 MG PO CAPS: 0.4000 mg | ORAL_CAPSULE | Freq: Every day | ORAL | 0 refills | Status: DC

## 2023-02-18 NOTE — Progress Notes (Addendum)
ANTICOAGULATION CONSULT NOTE - Follow Up Consult  Pharmacy Consult for warfarin Indication:  lupus anticoagulation, hx DVT  Allergies  Allergen Reactions   Losartan Potassium-Hctz Other (See Comments)    Unknown reaction.   Morphine And Codeine Itching    WITH PROLONGED USE    Patient Measurements: Height: 6\' 2"  (188 cm) Weight: 103.3 kg (227 lb 12.8 oz) IBW/kg (Calculated) : 82.2  Vital Signs: Temp: 98.3 F (36.8 C) (07/10 0729) Temp Source: Oral (07/10 0729) BP: 120/75 (07/10 0729) Pulse Rate: 69 (07/10 0729)  Labs: Recent Labs    02/17/23 0448 02/18/23 0340  HGB 10.4* 11.2*  HCT 31.6* 35.2*  PLT 247 301  LABPROT 14.9 16.0*  INR 1.2 1.3*     CrCl cannot be calculated (Patient's most recent lab result is older than the maximum 21 days allowed.).   Medications:  Scheduled:   buPROPion  300 mg Oral Daily   docusate sodium  100 mg Oral BID   escitalopram  20 mg Oral Daily   glipiZIDE  10 mg Oral Q breakfast   insulin aspart  0-20 Units Subcutaneous Q4H   irbesartan  300 mg Oral Daily   loratadine  10 mg Oral Daily   rosuvastatin  10 mg Oral QHS   sodium chloride flush  3 mL Intravenous Q12H   tamsulosin  0.4 mg Oral Daily   Warfarin - Pharmacist Dosing Inpatient   Does not apply q1600    Assessment: Mr. Stemen is a 67 YOM on warfarin PTA for lupus and hx DVT. He is s/p L3-5 PLIF on 7/3. Last dose of warfarin prior to the procedure was 6/26 and was bridged with lovenox prior to the procedure. Warfarin was resumed post-op on 7/8. Warfarin PTA regimen was 5 mg daily and 7.5 mg Saturday prior to the procedure.   INR today 1.3 (subtherapeutic). CBC stable.   Goal of Therapy:  INR 2-3 Monitor platelets by anticoagulation protocol: Yes   Plan:  Warfarin 5 mg PO tonight - c/w home regimen Monitor INR and CBC daily Monitor for s/sx of bleeding  Anticipate INR to reach therapeutic range in next few days after warfarin resumed. At this time would recommend to  discharge home with previous home regimen.   Rexford Maus, PharmD, BCPS 02/18/2023 7:34 AM

## 2023-02-18 NOTE — TOC Transition Note (Signed)
Transition of Care [facility]) - CM/SW Discharge Note   Patient Details  Name: Eddie Ryan MRN: [id number] Date of Birth: 06-13-1956  Transition of Care [facility]) CM/SW Contact:  Epifanio Lesches, RN Phone Number: 02/18/2023, 2:22 PM   Clinical Narrative:    Patient will DC to: home Anticipated DC date: 02/18/2023 Family notified: yes Transport by: car       -  s/p L3-4 and L4-5 decompression, 7/3 Per MD patient ready for DC today. RN, patient, patient's family notified of DC.  Order noted for home health services. Pt agreeable to services. Pt without provider preference. Referral made with [facility] and accepted. Pt states sister and girlfriend to assist with care once d/c. Pt without DME needs. States already with RW @ home. Post hospital f/u noted on AVS. Pt without RX med concerns.   Family to provide transportation to home.  RNCM will sign off for now as intervention is no longer needed. Please consult [place] again if new needs arise.    Final next level of care: Home w Home Health Services Barriers to Discharge: No Barriers Identified   Patient Goals and CMS Choice   Choice offered to / list presented to : Patient  Discharge Placement                         Discharge Plan and Services Additional resources added to the After Visit Summary for     Discharge Planning Services: CM Consult                      HH Arranged: PT [facility] Agency: [facility] Date [facility] Agency Contacted: 02/18/23 Time HH Agency Contacted: 1333 Representative spoke with at [facility] Agency: [place]  Social Determinants of Health (SDOH) Interventions SDOH Screenings   Food Insecurity: No Food Insecurity (02/11/2023)  Housing: Low Risk  (02/11/2023)  Transportation Needs: No Transportation Needs (02/11/2023)  Utilities: Not At Risk (02/11/2023)  Tobacco Use: Medium Risk (02/11/2023)     Readmission Risk Interventions     No data to display

## 2023-02-18 NOTE — Discharge Summary (Signed)
Physician Discharge Summary  Patient ID: Eddie Ryan MRN: [id number] DOB/AGE: [birth year] 67 y.o.  Admit date: 02/11/2023 Discharge date: 02/18/2023  Admission Diagnoses: Lumbar spondylolisthesis, lumbar degenerative disease, lumbar facet arthropathy, lumbar spinal stenosis, lumbar radiculopathy, neurogenic claudication, lumbago  Discharge Diagnoses: The same Principal Problem:   Spondylolisthesis of lumbar region   Discharged Condition: good  Hospital Course: I performed an L3-4 and L4-5 decompression, instrumentation and fusion on the patient on 02/11/2023  The patient's postoperative course was remarkable for urinary retention which was treated with Flomax.  This resolved.  PT and care management have seen the patient.  His insurance refused approval for inpatient rehab.  I discussed skilled nursing facility placement versus going home with home PT the patient elected the latter.  02/18/2023 the patient was ready to go home and was discharged  Consults: PT, care management Significant Diagnostic Studies: None Treatments: L3-4 and L4-5 decompression, instrumentation and fusion Discharge Exam: Blood pressure 120/75, pulse 69, temperature 98.3 F (36.8 C), temperature source Oral, resp. rate 18, height 6\' 2"  (1.88 m), weight 103.3 kg, SpO2 95 %. The patient is alert and pleasant.  His strength is normal.  His wound is healing well with  Disposition: Home  Discharge Instructions     Call MD for:  difficulty breathing, headache or visual disturbances   Complete by: As directed    Call MD for:  extreme fatigue   Complete by: As directed    Call MD for:  hives   Complete by: As directed    Call MD for:  persistant dizziness or light-headedness   Complete by: As directed    Call MD for:  persistant nausea and vomiting   Complete by: As directed    Call MD for:  redness, tenderness, or signs of infection (pain, swelling, redness, odor or green/yellow discharge around incision  site)   Complete by: As directed    Call MD for:  severe uncontrolled pain   Complete by: As directed    Call MD for:  temperature >100.4   Complete by: As directed    Diet - low sodium heart healthy   Complete by: As directed    Discharge instructions   Complete by: As directed    Call [phone] for a followup appointment. Take a stool softener while you are using pain medications.   Driving Restrictions   Complete by: As directed    Do not drive for 2 weeks.   Increase activity slowly   Complete by: As directed    Lifting restrictions   Complete by: As directed    Do not lift more than 5 pounds. No excessive bending or twisting.   May shower / Bathe   Complete by: As directed    Remove the dressing for 3 days after surgery.  You may shower, but leave the incision alone.   No dressing needed   Complete by: As directed       Allergies as of 02/18/2023       Reactions   Losartan Potassium-hctz Other (See Comments)   Unknown reaction.   Morphine And Codeine Itching   WITH PROLONGED USE        Medication List     STOP taking these medications    etodolac 400 MG tablet Commonly known as: LODINE       TAKE these medications    ALPRAZolam 0.5 MG tablet Commonly known as: XANAX Take 0.5 mg by mouth in the morning and at bedtime.  buPROPion 150 MG 24 hr tablet Commonly known as: WELLBUTRIN XL Take 300 mg by mouth daily.   cyclobenzaprine 10 MG tablet Commonly known as: FLEXERIL Take 1 tablet (10 mg total) by mouth 3 (three) times daily as needed for muscle spasms.   diclofenac Sodium 1 % Gel Commonly known as: VOLTAREN Apply 1 g topically 4 (four) times daily as needed (thumb as needed for pain.).   docusate sodium 100 MG capsule Commonly known as: COLACE Take 200 mg by mouth daily.   ergocalciferol 1.25 MG (50000 UT) capsule Commonly known as: VITAMIN D2 Take 50,000 Units by mouth every Monday.   escitalopram 20 MG tablet Commonly known as:  LEXAPRO Take 20 mg by mouth daily.   glipiZIDE 10 MG 24 hr tablet Commonly known as: GLUCOTROL XL Take 10 mg by mouth daily with breakfast.   loratadine 10 MG tablet Commonly known as: CLARITIN Take 10 mg by mouth daily.   Magnesium 500 MG Tabs Take 1,000 mg by mouth daily.   oxyCODONE-acetaminophen 5-325 MG tablet Commonly known as: Percocet Take 1-2 tablets by mouth every 4 (four) hours as needed for moderate pain or severe pain.   rosuvastatin 10 MG tablet Commonly known as: CRESTOR Take 10 mg by mouth at bedtime.   tamsulosin 0.4 MG Caps capsule Commonly known as: FLOMAX Take 1 capsule (0.4 mg total) by mouth daily. Start taking on: February 19, 2023   telmisartan 80 MG tablet Commonly known as: MICARDIS Take 40 mg by mouth every evening.   warfarin 5 MG tablet Commonly known as: COUMADIN Take 5 mg by mouth See admin instructions. Take 7.5 mg by mouth on Saturday evening & take 5 mg on Sunday, Monday, Tuesday, Wednesday, Thursday & Friday evening.               Discharge Care Instructions  (From admission, onward)           Start     Ordered   02/18/23 0000  No dressing needed        07 /10/24 1130             Signed: Cristi Loron 02/18/2023, 11:32 AM

## 2023-02-18 NOTE — [place] FL2 (Signed)
[place] MEDICAID FL2 LEVEL OF CARE FORM     IDENTIFICATION  Patient Name: Eddie Ryan Birthdate: [birth year] Sex: male Admission Date (Current Location): 02/11/2023  [facility] and [place] Number:  Producer, television/film/video and Address:  [facility], [address] [place], [place] [address]      Provider Number: [id number]  Attending Physician Name and Address:  Tressie Stalker, MD  Relative Name and Phone Number:  Flo Shanks (Sister)  [phone] [facility])    Current Level of Care: SNF Recommended Level of Care: Skilled Nursing Facility Prior Approval Number:    Date Approved/Denied:   PASRR Number: Pending  Discharge Plan: SNF    Current Diagnoses: Patient Active Problem List   Diagnosis Date Noted   Spondylolisthesis of lumbar region 02/11/2023   Neuropathy of both feet 08/08/2021   Diffuse large B-cell lymphoma of extranodal site excluding spleen and other solid organs (HCC) 08/06/2020   Goals of care, counseling/discussion 07/20/2020   Nephrolithiasis 06/07/2020   Cervical post-laminectomy syndrome 06/15/2019   Acquired trigger finger 07/01/2018   DM type 2 with diabetic mixed hyperlipidemia (HCC) 05/25/2018   Hyperlipidemia, mixed 01/09/2016   Essential hypertension 05/23/2014   Cervical disc disease 12/21/2013   DVT (deep venous thrombosis) (HCC) 12/21/2013   Lupus anticoagulant syndrome (HCC) 12/21/2013    Orientation RESPIRATION BLADDER Height & Weight     Self, Time, Situation, Place  Normal Continent Weight: 227 lb 12.8 oz (103.3 kg) Height:  6\' 2"  (188 cm)  BEHAVIORAL SYMPTOMS/MOOD NEUROLOGICAL BOWEL NUTRITION STATUS      Continent Diet (see d/c summary)  AMBULATORY STATUS COMMUNICATION OF NEEDS Skin   Extensive Assist Verbally Surgical wounds (incision back)                       Personal Care Assistance Level of Assistance  Bathing, Feeding, Dressing Bathing Assistance: Limited assistance Feeding assistance:  Independent Dressing Assistance: Limited assistance     Functional Limitations Info  Sight, Hearing, Speech Sight Info: Adequate Hearing Info: Adequate Speech Info: Adequate    SPECIAL CARE FACTORS FREQUENCY  OT (By licensed OT), PT (By licensed PT)     PT Frequency: 5x/week OT Frequency: 5x/week            Contractures Contractures Info: Not present    Additional Factors Info  Code Status, Allergies Code Status Info: Full code Allergies Info: Losartan Potassium-hctz, morphine, codeine           Current Medications (02/18/2023):  This is the current hospital active medication list Current Facility-Administered Medications  Medication Dose Route Frequency Provider Last Rate Last Admin   0.9 %  sodium chloride infusion  250 mL Intravenous Continuous Tressie Stalker, MD   Stopped at 02/12/23 1124   acetaminophen (TYLENOL) tablet 650 mg  650 mg Oral Q4H PRN Tressie Stalker, MD   650 mg at 02/15/23 2306   Or   acetaminophen (TYLENOL) suppository 650 mg  650 mg Rectal Q4H PRN Tressie Stalker, MD       ALPRAZolam Prudy Feeler) tablet 0.5 mg  0.5 mg Oral BID PRN Tressie Stalker, MD   0.5 mg at 02/16/23 2032   bisacodyl (DULCOLAX) suppository 10 mg  10 mg Rectal Daily PRN Tressie Stalker, MD   10 mg at 02/14/23 1833   buPROPion (WELLBUTRIN XL) 24 hr tablet 300 mg  300 mg Oral Daily Tressie Stalker, MD   300 mg at 02/18/23 0824   cyclobenzaprine (FLEXERIL) tablet 10 mg  10 mg Oral TID PRN Tressie Stalker, MD   10 mg at 02/17/23 1250   docusate sodium (COLACE) capsule 100 mg  100 mg Oral BID Tressie Stalker, MD   100 mg at 02/18/23 0825   escitalopram (LEXAPRO) tablet 20 mg  20 mg Oral Daily Tressie Stalker, MD   20 mg at 02/18/23 0825   glipiZIDE (GLUCOTROL XL) 24 hr tablet 10 mg  10 mg Oral Q breakfast Tressie Stalker, MD   10 mg at 02/18/23 0825   insulin aspart (novoLOG) injection 0-20 Units  0-20 Units Subcutaneous Q4H Tressie Stalker, MD   3 Units at 02/18/23 1610    irbesartan (AVAPRO) tablet 300 mg  300 mg Oral Daily Tressie Stalker, MD   300 mg at 02/18/23 0825   loratadine (CLARITIN) tablet 10 mg  10 mg Oral Daily Tressie Stalker, MD   10 mg at 02/18/23 0825   menthol-cetylpyridinium (CEPACOL) lozenge 3 mg  1 lozenge Oral PRN Tressie Stalker, MD       Or   phenol (CHLORASEPTIC) mouth spray 1 spray  1 spray Mouth/Throat PRN Tressie Stalker, MD       morphine (PF) 4 MG/ML injection 4 mg  4 mg Intravenous Q2H PRN Tressie Stalker, MD   4 mg at 02/16/23 1438   ondansetron (ZOFRAN) tablet 4 mg  4 mg Oral Q6H PRN Tressie Stalker, MD   4 mg at 02/12/23 1032   Or   ondansetron (ZOFRAN) injection 4 mg  4 mg Intravenous Q6H PRN Tressie Stalker, MD       oxyCODONE (Oxy IR/ROXICODONE) immediate release tablet 10 mg  10 mg Oral Q3H PRN Tressie Stalker, MD   10 mg at 02/18/23 9604   oxyCODONE (Oxy IR/ROXICODONE) immediate release tablet 5 mg  5 mg Oral Q3H PRN Tressie Stalker, MD   5 mg at 02/12/23 1725   rosuvastatin (CRESTOR) tablet 10 mg  10 mg Oral QHS Tressie Stalker, MD   10 mg at 02/17/23 2152   sodium chloride flush (NS) 0.9 % injection 3 mL  3 mL Intravenous Q12H Tressie Stalker, MD   3 mL at 02/18/23 0826   sodium chloride flush (NS) 0.9 % injection 3 mL  3 mL Intravenous PRN Tressie Stalker, MD       tamsulosin [facility]) capsule 0.4 mg  0.4 mg Oral Daily Lisbeth Renshaw, MD   0.4 mg at 02/18/23 0825   warfarin (COUMADIN) tablet 5 mg  5 mg Oral ONCE-1600 Rexford Maus, [facility]       Warfarin - Pharmacist Dosing Inpatient   Does not apply q1600 Ilda Basset, [facility]         Discharge Medications: Please see discharge summary for a list of discharge medications.  Relevant Imaging Results:  Relevant Lab Results:   Additional Information SSN [id number]  [address] [place], [place]

## 2023-02-18 NOTE — Progress Notes (Signed)
PT Cancellation Note  Patient Details Name: Eddie Ryan MRN: [id number] DOB: October 03, 1955   Cancelled Treatment:    Reason Eval/Treat Not Completed: Other (comment) (Patient declined as he is awaiting discharge home today. He reports no questions/concerns about PT related issues. He is hopeful to have home health PT set up for home.)  Donna Bernard, PT, MPT  In[facility] 02/18/2023, 2:08 PM

## 2023-02-18 NOTE — Progress Notes (Cosign Needed)
       RE:  Eddie Ryan      Date of Birth:  [birth year]    Date:   02/18/2023      To Whom It May Concern:  Please be advised that the above-named patient will require a short-term nursing home stay - anticipated 30 days or less for rehabilitation and strengthening.  The plan is for return home.

## 2023-02-19 MED FILL — Heparin Sodium (Porcine) Inj 1000 Unit/ML: INTRAMUSCULAR | Qty: 30 | Status: AC

## 2023-05-08 ENCOUNTER — Inpatient Hospital Stay: Payer: Medicare Other | Admitting: Oncology

## 2023-05-08 ENCOUNTER — Inpatient Hospital Stay: Payer: Medicare Other | Attending: Oncology

## 2023-07-21 ENCOUNTER — Other Ambulatory Visit: Payer: Self-pay

## 2023-07-21 ENCOUNTER — Inpatient Hospital Stay: Payer: Medicare Other | Attending: Oncology

## 2023-07-21 ENCOUNTER — Encounter: Payer: Self-pay | Admitting: Oncology

## 2023-07-21 ENCOUNTER — Inpatient Hospital Stay: Payer: Medicare Other | Admitting: Oncology

## 2023-07-21 VITALS — BP 137/89 | HR 77 | Temp 97.6°F | Resp 19 | Wt 226.0 lb

## 2023-07-21 DIAGNOSIS — Z79899 Other long term (current) drug therapy: Secondary | ICD-10-CM | POA: Insufficient documentation

## 2023-07-21 DIAGNOSIS — Z08 Encounter for follow-up examination after completed treatment for malignant neoplasm: Secondary | ICD-10-CM

## 2023-07-21 DIAGNOSIS — Z86718 Personal history of other venous thrombosis and embolism: Secondary | ICD-10-CM | POA: Diagnosis not present

## 2023-07-21 DIAGNOSIS — D6862 Lupus anticoagulant syndrome: Secondary | ICD-10-CM | POA: Insufficient documentation

## 2023-07-21 DIAGNOSIS — Z818 Family history of other mental and behavioral disorders: Secondary | ICD-10-CM | POA: Insufficient documentation

## 2023-07-21 DIAGNOSIS — Z885 Allergy status to narcotic agent status: Secondary | ICD-10-CM | POA: Diagnosis not present

## 2023-07-21 DIAGNOSIS — Z8042 Family history of malignant neoplasm of prostate: Secondary | ICD-10-CM | POA: Diagnosis not present

## 2023-07-21 DIAGNOSIS — Z87442 Personal history of urinary calculi: Secondary | ICD-10-CM | POA: Insufficient documentation

## 2023-07-21 DIAGNOSIS — Z7901 Long term (current) use of anticoagulants: Secondary | ICD-10-CM | POA: Insufficient documentation

## 2023-07-21 DIAGNOSIS — Z806 Family history of leukemia: Secondary | ICD-10-CM | POA: Diagnosis not present

## 2023-07-21 DIAGNOSIS — C8336 Diffuse large B-cell lymphoma, intrapelvic lymph nodes: Secondary | ICD-10-CM | POA: Diagnosis present

## 2023-07-21 DIAGNOSIS — Z87891 Personal history of nicotine dependence: Secondary | ICD-10-CM | POA: Insufficient documentation

## 2023-07-21 DIAGNOSIS — Z800 Family history of malignant neoplasm of digestive organs: Secondary | ICD-10-CM | POA: Insufficient documentation

## 2023-07-21 DIAGNOSIS — N200 Calculus of kidney: Secondary | ICD-10-CM | POA: Diagnosis not present

## 2023-07-21 DIAGNOSIS — Z8579 Personal history of other malignant neoplasms of lymphoid, hematopoietic and related tissues: Secondary | ICD-10-CM

## 2023-07-21 LAB — COMPREHENSIVE METABOLIC PANEL
ALT: 21 U/L (ref 0–44)
AST: 19 U/L (ref 15–41)
Albumin: 4 g/dL (ref 3.5–5.0)
Alkaline Phosphatase: 47 U/L (ref 38–126)
Anion gap: 9 (ref 5–15)
BUN: 15 mg/dL (ref 8–23)
CO2: 27 mmol/L (ref 22–32)
Calcium: 9.3 mg/dL (ref 8.9–10.3)
Chloride: 105 mmol/L (ref 98–111)
Creatinine, Ser: 1.43 mg/dL — ABNORMAL HIGH (ref 0.61–1.24)
GFR, Estimated: 54 mL/min — ABNORMAL LOW (ref >60)
Glucose, Bld: 153 mg/dL — ABNORMAL HIGH (ref 70–99)
Potassium: 4.3 mmol/L (ref 3.5–5.1)
Sodium: 141 mmol/L (ref 135–145)
Total Bilirubin: 0.8 mg/dL (ref <1.2)
Total Protein: 6.7 g/dL (ref 6.5–8.1)

## 2023-07-21 LAB — CBC WITH DIFFERENTIAL/PLATELET
Abs Immature Granulocytes: 0.01 10*3/uL (ref 0.00–0.07)
Basophils Absolute: 0.1 10*3/uL (ref 0.0–0.1)
Basophils Relative: 1 %
Eosinophils Absolute: 0.1 10*3/uL (ref 0.0–0.5)
Eosinophils Relative: 2 %
HCT: 47.7 % (ref 39.0–52.0)
Hemoglobin: 15.5 g/dL (ref 13.0–17.0)
Immature Granulocytes: 0 %
Lymphocytes Relative: 26 %
Lymphs Abs: 1.9 10*3/uL (ref 0.7–4.0)
MCH: 26.7 pg (ref 26.0–34.0)
MCHC: 32.5 g/dL (ref 30.0–36.0)
MCV: 82.1 fL (ref 80.0–100.0)
Monocytes Absolute: 0.7 10*3/uL (ref 0.1–1.0)
Monocytes Relative: 9 %
Neutro Abs: 4.7 10*3/uL (ref 1.7–7.7)
Neutrophils Relative %: 62 %
Platelets: 217 10*3/uL (ref 150–400)
RBC: 5.81 MIL/uL (ref 4.22–5.81)
RDW: 14.6 % (ref 11.5–15.5)
WBC: 7.4 10*3/uL (ref 4.0–10.5)
nRBC: 0 % (ref 0.0–0.2)

## 2023-07-21 LAB — LACTATE DEHYDROGENASE: LDH: 125 U/L (ref 98–192)

## 2023-07-21 NOTE — Progress Notes (Signed)
Hematology/Oncology Consult note [facility]  [phone] [phone]  Patient Care Team: Danella Penton, MD as PCP - General (Internal Medicine) Creig Hines, MD as Consulting Physician (Oncology) Carmina Miller, MD as Consulting Physician (Radiation Oncology)   Name of the patient: Eddie Ryan  [id number]  07/02/1956   Date of visit: 07/21/23  Diagnosis-  stage I E high-grade B-cell lymphoma currently in CR 1   Chief complaint/ Reason for visit-routine surveillance visit for diffuse large B-cell lymphoma  Heme/Onc history: Patient is a 67 year old male with a prior history of DVT about 15 years ago and was diagnosed with antiphospholipid antibody syndrome with a positive lupus anticoagulant.  He has remained on Coumadin since then.  He has been seeing Dr. Apolinar Junes for kidney stones.  He had a CT abdomen and pelvis in August 2021 which showed thickening of the urothelium concerning for a urothelial lesion.  He underwent a cystoscopy and ureteroscopy in September 2021 which did not show any intraluminal pathology.  Plan was to get a repeat CT urogram 6 weeks following that.  He had a CT hematuria work-up again in November 2021 which showed enhancing soft tissue lesion in the proximal right ureter measuring 2.3 x 2.3 x 3.5 cm which was extending for 2.6 cm and enlarges compared to prior size of 1.5 x 1.1 x 2.9 cm.  There is no evidence of retroperitoneal adenopathy.  Left external iliac node was 1.2 cm and was unchanged as compared to prior CT scan.  He underwent a repeat ureteroscopy with stent placement.  There was no obvious filling defect but there was a slight narrowing of ureter and patient therefore underwent a CT-guided biopsy by IR   Biopsy showed B-cell lymphoma High-grade with a Ki-67 of greater than 90%. Comment:  Core biopsy sections display lymphoid tissue. There are aggregates  comprised of larger lymphocytes, interspersed with  areas of small mature  lymphocytes, and focal background bands of fibrosis. Typical follicular  architecture is not present.   Immunohistochemical studies show strong and diffuse positivity for CD20,  indicating a B cell proliferation, comprised of both smaller and larger  lymphocytes. CD3 marks scattered background T cells. The subset of  larger B cells shows positivity for CD10, BCL6, and MUM-1 (partial).  These cells are negative for BCL2, Cyclin-D1, and CD30. BCL2 appears to  show marking in the subset of smaller lymphocytes, as well as normal T  cells. Ki-67 is significantly elevated in the subset of larger B cells,  estimated at greater than 90% staining. C-myc shows dim staining in a  portion of the larger B cells, approximately 20-30%. Pancytokeratin and  CD56 are negative.   Flow cytometric studies detect an abnormal CD10+ B cell population in a  background of polytypic B cells. These CD10+ B cells represent 28% of B  cells and show very dim kappa light chain restriction, to negative light  chain expression. By light scatter, these B cells appear approximately  the same size as background T cells. There is no loss of, or aberrant  expression of, pan T-cell antigens to suggest a neoplastic T cell  process. Cell viability in this sample is sufficient for analysis, but  less than optimal.   Overall classification of this process is limited due to the small size  of core biopsy samples. The histologic and immunophenotypic findings are  compatible with involvement by a B cell lymphoma. CD10 expression, as  seen by immunohistochemistry and flow  cytometry, may imply  germinal/follicular center origin of these B cells. Absence of staining  for BCL2 could suggest against follicular lymphoma, although BCL2  expression may be absent in 25-50% of cases of grade 3 follicular  lymphoma. The differential diagnosis may also include diffuse large B  cell lymphoma, given the increased  proportion of larger abnormal B  cells. Further subclassification of this process would be dependent upon  excisional biopsy or review of overall lesional architecture. There is  sufficient tissue for ancillary FISH or NGS testing, if desired.   Bone marrow biopsy was negative for lymphoma.  PET/CT scan showed:A 3.3 x 2.7 cm soft tissue lesion around the right proximal ureter with an SUV of 13.4.  There was no other hypermetabolic abdominopelvic adenopathy noted.  No hypermetabolism noted in the liver or spleen or other organs.  No intrathoracic adenopathy.  No hypermetabolic activity noted in the bones.   Patient underwent 3 cycles of R-CHOP chemotherapy ending in February 2022 followed by involved field radiation.  He remains in remission  Interval history-he is doing well presently.  He underwent lumbar decompression surgery earlier this year and he still has ongoing back pain and wears a back brace.  ECOG PS- 0 Pain scale- 0   Review of systems- Review of Systems  Constitutional:  Negative for chills, fever, malaise/fatigue and weight loss.  HENT:  Negative for congestion, ear discharge and nosebleeds.   Eyes:  Negative for blurred vision.  Respiratory:  Negative for cough, hemoptysis, sputum production, shortness of breath and wheezing.   Cardiovascular:  Negative for chest pain, palpitations, orthopnea and claudication.  Gastrointestinal:  Negative for abdominal pain, blood in stool, constipation, diarrhea, heartburn, melena, nausea and vomiting.  Genitourinary:  Negative for dysuria, flank pain, frequency, hematuria and urgency.  Musculoskeletal:  Negative for back pain, joint pain and myalgias.  Skin:  Negative for rash.  Neurological:  Negative for dizziness, tingling, focal weakness, seizures, weakness and headaches.  Endo/Heme/Allergies:  Does not bruise/bleed easily.  Psychiatric/Behavioral:  Negative for depression and suicidal ideas. The patient does not have insomnia.        Allergies  Allergen Reactions   Losartan Potassium-Hctz Other (See Comments)    Unknown reaction.   Morphine And Codeine Itching    WITH PROLONGED USE     Past Medical History:  Diagnosis Date   Anxiety    Arthritis    Collagen vascular disease (HCC)    Depression    Diabetes mellitus without complication (HCC)    DVT (deep venous thrombosis) (HCC)    Headache    occasionally   History of kidney stones    Hypertension    Lupus anticoagulant disorder (HCC)    Lymphoma (HCC) 07/2020   Sleep apnea    Umbilical hernia      Past Surgical History:  Procedure Laterality Date   CERVICAL FUSION     CYSTOSCOPY W/ RETROGRADES Left 04/26/2020   Procedure: CYSTOSCOPY WITH RETROGRADE PYELOGRAM;  Surgeon: Vanna Scotland, MD;  Location: [facility];  Service: Urology;  Laterality: Left;   CYSTOSCOPY WITH URETEROSCOPY AND STENT PLACEMENT Right 07/02/2020   Procedure: CYSTOSCOPY WITH URETEROSCOPY AND STENT PLACEMENT;  Surgeon: Vanna Scotland, MD;  Location: [facility];  Service: Urology;  Laterality: Right;   CYSTOSCOPY/URETEROSCOPY/HOLMIUM LASER/STENT PLACEMENT Right 04/26/2020   Procedure: CYSTOSCOPY/URETEROSCOPY/HOLMIUM LASER/STENT PLACEMENT;  Surgeon: Vanna Scotland, MD;  Location: [facility];  Service: Urology;  Laterality: Right;   IR IMAGING GUIDED PORT INSERTION  07/27/2020   IR REMOVAL TUN  ACCESS W/ PORT W/O FL MOD SED  02/08/2021   PLANTAR FASCIA SURGERY Bilateral    ROTATOR CUFF REPAIR Left    THUMB FUSION Right    URETERAL BIOPSY Right 04/26/2020   Procedure: URETERAL BIOPSY with ablation;  Surgeon: Vanna Scotland, MD;  Location: [facility];  Service: Urology;  Laterality: Right;   URETEROSCOPY WITH HOLMIUM LASER LITHOTRIPSY      Social History   Socioeconomic History   Marital status: Single    Spouse name: Not on file   Number of children: 0   Years of education: Not on file   Highest education level: Not on file  Occupational History    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   Social Determinants of Health   Financial Resource Strain: Low Risk  (01/29/2023)   [facility], [facility]   Overall Financial Resource Strain (CARDIA)    Difficulty of Paying Living Expenses: Not hard at all  Food Insecurity: No Food Insecurity (02/11/2023)   Hunger Vital Sign    Worried About Running Out of Food in the Last Year: Never true    Ran Out of Food in the Last Year: Never true  Transportation Needs: No Transportation Needs (02/11/2023)   PRAPARE - Administrator, Civil Service (Medical): No    Lack of Transportation (Non-Medical): No  Physical Activity: Not on file  Stress: Not on file  Social Connections: Not on file  Intimate Partner Violence: Not At Risk (02/12/2023)   Humiliation, Afraid, Rape, and Kick questionnaire    Fear of Current or Ex-Partner: No    Emotionally Abused: No    Physically Abused: No    Sexually Abused: No    Family History  Problem Relation Age of Onset   Dementia Mother    Pancreatic cancer Father    Prostate cancer Paternal Grandmother    Leukemia Nephew      Current Outpatient Medications:    ALPRAZolam (XANAX) 0.5 MG tablet, Take 0.5 mg by mouth in the morning and at bedtime. , Disp: , Rfl:    buPROPion (WELLBUTRIN XL) 150 MG 24 hr tablet, Take 300 mg by mouth daily. , Disp: , Rfl:    diclofenac Sodium (VOLTAREN) 1 % GEL, Apply 1 g topically 4 (four) times daily as needed (thumb as needed for pain.). , Disp: , Rfl:    docusate sodium (COLACE) 100 MG  capsule, Take 200 mg by mouth daily., Disp: , Rfl:    ergocalciferol (VITAMIN D2) 1.25 MG (50000 UT) capsule, Take 50,000 Units by mouth every Monday. , Disp: , Rfl:    escitalopram (LEXAPRO) 20 MG tablet, Take 20 mg by mouth daily. , Disp: , Rfl:    glipiZIDE (GLUCOTROL XL) 10 MG 24 hr tablet, Take 10 mg by mouth daily with breakfast., Disp: , Rfl:    loratadine (CLARITIN) 10 MG tablet, Take 10 mg by mouth daily. , Disp: , Rfl:    Magnesium 500  MG TABS, Take 1,000 mg by mouth daily., Disp: , Rfl:    oxyCODONE-acetaminophen (PERCOCET) 5-325 MG tablet, Take 1-2 tablets by mouth every 4 (four) hours as needed for moderate pain or severe pain., Disp: 20 tablet, Rfl: 0   rosuvastatin (CRESTOR) 10 MG tablet, Take 10 mg by mouth at bedtime., Disp: , Rfl:    warfarin (COUMADIN) 5 MG tablet, Take 5 mg by mouth See admin instructions. Take 7.5 mg by mouth on Saturday evening & take 5 mg on Sunday, Monday, Tuesday, Wednesday, Thursday & Friday evening., Disp: , Rfl:    cyclobenzaprine (FLEXERIL) 10 MG tablet, Take 1 tablet (10 mg total) by mouth 3 (three) times daily as needed for muscle spasms., Disp: 30 tablet, Rfl: 0   tamsulosin (FLOMAX) 0.4 MG CAPS capsule, Take 1 capsule (0.4 mg total) by mouth daily., Disp: 30 capsule, Rfl: 0   telmisartan (MICARDIS) 80 MG tablet, Take 40 mg by mouth every evening. , Disp: , Rfl:   Physical exam:  Vitals:   07/21/23 1034  BP: 137/89  Pulse: 77  Resp: 19  Temp: 97.6 F (36.4 C)  SpO2: 98%  Weight: 226 lb (102.5 kg)   Physical Exam Cardiovascular:     Rate and Rhythm: Normal rate and regular rhythm.     Heart sounds: Normal heart sounds.  Pulmonary:     Effort: Pulmonary effort is normal.     Breath sounds: Normal breath sounds.  Abdominal:     General: Bowel sounds are normal.     Palpations: Abdomen is soft.     Comments: No palpable hepatosplenomegaly  Lymphadenopathy:     Comments: No palpable cervical, supraclavicular, axillary or inguinal  adenopathy    Skin:    General: Skin is warm and dry.  Neurological:     Mental Status: He is alert and oriented to person, place, and time.         Latest Ref Rng & Units 07/21/2023   10:22 AM  CMP  Glucose 70 - 99 mg/dL 253   BUN 8 - 23 mg/dL 15   Creatinine 6.64 - 1.24 mg/dL 4.03   Sodium 474 - 259 mmol/L 141   Potassium 3.5 - 5.1 mmol/L 4.3   Chloride 98 - 111 mmol/L 105   CO2 22 - 32 mmol/L 27   Calcium 8.9 - 10.3 mg/dL 9.3   Total Protein 6.5 - 8.1 g/dL 6.7   Total Bilirubin <5.6 mg/dL 0.8   Alkaline Phos 38 - 126 U/L 47   AST 15 - 41 U/L 19   ALT 0 - 44 U/L 21       Latest Ref Rng & Units 07/21/2023   10:22 AM  CBC  WBC 4.0 - 10.5 K/uL 7.4   Hemoglobin 13.0 - 17.0 g/dL 38.7   Hematocrit 56.4 - 52.0 % 47.7   Platelets 150 - 400 K/uL 217      Assessment and plan- Patient is a 68 y.o. male  with history of stage I E diffuse large B-cell lymphoma s/p 3 cycles of R-CHOP and IFRT.  He is currently in CR 1 and this is a routine surveillance visit  Clinically patient is doing well with no concerning signs and symptoms of recurrence based on today's exam.  He is more than 2 years out of his lymphoma diagnosis and his CT scans and earlier part of this year also did not show any evidence of recurrence.  I will continue to monitor his lymphoma with labs and  physical exams every 6 months.   Visit Diagnosis 1. Encounter for follow-up surveillance of diffuse large B-cell lymphoma      Dr. Owens Shark, MD, MPH [facility] at [facility] [phone] 07/21/2023 12:28 PM

## 2023-10-06 ENCOUNTER — Other Ambulatory Visit: Payer: Self-pay | Admitting: Neurosurgery

## 2023-10-21 ENCOUNTER — Other Ambulatory Visit: Payer: Self-pay | Admitting: Neurosurgery

## 2023-11-02 ENCOUNTER — Encounter (HOSPITAL_COMMUNITY): Payer: Self-pay

## 2023-11-02 ENCOUNTER — Other Ambulatory Visit: Payer: Self-pay

## 2023-11-02 ENCOUNTER — Encounter (HOSPITAL_COMMUNITY)
Admission: RE | Admit: 2023-11-02 | Discharge: 2023-11-02 | Disposition: A | Discharge status: Home / Self Care | Source: Ambulatory Visit | Attending: Neurosurgery | Admitting: Neurosurgery

## 2023-11-02 VITALS — BP 134/93 | HR 83 | Temp 98.2°F | Resp 20 | Ht 74.0 in | Wt 231.6 lb

## 2023-11-02 DIAGNOSIS — E119 Type 2 diabetes mellitus without complications: Secondary | ICD-10-CM | POA: Diagnosis not present

## 2023-11-02 DIAGNOSIS — M4722 Other spondylosis with radiculopathy, cervical region: Secondary | ICD-10-CM | POA: Insufficient documentation

## 2023-11-02 DIAGNOSIS — Z87891 Personal history of nicotine dependence: Secondary | ICD-10-CM | POA: Insufficient documentation

## 2023-11-02 DIAGNOSIS — D6861 Antiphospholipid syndrome: Secondary | ICD-10-CM | POA: Insufficient documentation

## 2023-11-02 DIAGNOSIS — E1169 Type 2 diabetes mellitus with other specified complication: Secondary | ICD-10-CM

## 2023-11-02 DIAGNOSIS — Z86718 Personal history of other venous thrombosis and embolism: Secondary | ICD-10-CM | POA: Diagnosis not present

## 2023-11-02 DIAGNOSIS — Z01818 Encounter for other preprocedural examination: Secondary | ICD-10-CM | POA: Diagnosis present

## 2023-11-02 DIAGNOSIS — I10 Essential (primary) hypertension: Secondary | ICD-10-CM | POA: Diagnosis not present

## 2023-11-02 DIAGNOSIS — D6862 Lupus anticoagulant syndrome: Secondary | ICD-10-CM | POA: Insufficient documentation

## 2023-11-02 LAB — CBC
HCT: 49.3 % (ref 39.0–52.0)
Hemoglobin: 15.8 g/dL (ref 13.0–17.0)
MCH: 26.8 pg (ref 26.0–34.0)
MCHC: 32 g/dL (ref 30.0–36.0)
MCV: 83.6 fL (ref 80.0–100.0)
Platelets: 207 K/uL (ref 150–400)
RBC: 5.9 MIL/uL — ABNORMAL HIGH (ref 4.22–5.81)
RDW: 14.4 % (ref 11.5–15.5)
WBC: 6.1 K/uL (ref 4.0–10.5)
nRBC: 0 % (ref 0.0–0.2)

## 2023-11-02 LAB — BASIC METABOLIC PANEL WITH GFR
Anion gap: 8 (ref 5–15)
BUN: 12 mg/dL (ref 8–23)
CO2: 26 mmol/L (ref 22–32)
Calcium: 9.4 mg/dL (ref 8.9–10.3)
Chloride: 105 mmol/L (ref 98–111)
Creatinine, Ser: 1.42 mg/dL — ABNORMAL HIGH (ref 0.61–1.24)
GFR, Estimated: 54 mL/min — ABNORMAL LOW
Glucose, Bld: 181 mg/dL — ABNORMAL HIGH (ref 70–99)
Potassium: 4.8 mmol/L (ref 3.5–5.1)
Sodium: 139 mmol/L (ref 135–145)

## 2023-11-02 LAB — SURGICAL PCR SCREEN
MRSA, PCR: NEGATIVE
Staphylococcus aureus: NEGATIVE

## 2023-11-02 LAB — TYPE AND SCREEN
ABO/RH(D): A NEG
Antibody Screen: NEGATIVE

## 2023-11-02 LAB — HEMOGLOBIN A1C
Hgb A1c MFr Bld: 7.9 % — ABNORMAL HIGH (ref 4.8–5.6)
Mean Plasma Glucose: 180.03 mg/dL

## 2023-11-02 LAB — GLUCOSE, CAPILLARY: Glucose-Capillary: 165 mg/dL — ABNORMAL HIGH (ref 70–99)

## 2023-11-02 NOTE — Progress Notes (Signed)
 PCP - Bethann Punches MD Cardiologist -   PPM/ICD - denies Device Orders - n/a Rep Notified - n.a  Chest x-ray -  EKG - 11-02-23 Stress Test -  ECHO - 07-26-20 Cardiac Cath -   Sleep Study - years ago CPAP - nightly  Fasting Blood Sugar -  MD monitors A1c last A1c 7.6 on 07-20-23 Checks Blood Sugar  Per patient MD requests he check his blood sugar every 2 weeks but does not always do so   Blood Thinner Instructions: Coumadin Per patient he has instructions at home with dates but has not read them Aspirin Instructions: denies  ERAS Protcol -NPO   COVID TEST- n/a   Anesthesia review: yes Hx of DVT on coumadin bridge to lovenox  Patient denies shortness of breath, fever, cough and chest pain at PAT appointment   All instructions explained to the patient, with a verbal understanding of the material. Patient agrees to go over the instructions while at home for a better understanding. Patient also instructed to self quarantine after being tested for COVID-19. The opportunity to ask questions was provided.

## 2023-11-02 NOTE — Progress Notes (Signed)
 Surgical Instructions   Your procedure is scheduled on November 16, 2023. Report to [facility] Main Entrance "A" at 10;45 A.M., then check in with the Admitting office. Any questions or running late day of surgery: call [phone]  Questions prior to your surgery date: call [phone], Monday-Friday, 8am-4pm. If you experience any cold or flu symptoms such as cough, fever, chills, shortness of breath, etc. between now and your scheduled surgery, please notify us at the above number.     Remember:  Do not eat after midnight the night before your surgery      Take these medicines the morning of surgery with A SIP OF WATER  ALPRAZolam (XANAX)  buPROPion (WELLBUTRIN XL)  escitalopram (LEXAPRO)  loratadine (CLARITIN)  tamsulosin [facility])   May take these medicines IF NEEDED: cyclobenzaprine (FLEXERIL)  oxyCODONE-acetaminophen (PERCOCET)   warfarin (COUMADIN) PLEASE REACH OUT TO YOUR SURGEON FOR INSTRUCTIONS REGARDING COUMADIN   One week prior to surgery, STOP taking any Aspirin (unless otherwise instructed by your surgeon) Aleve, Naproxen, Ibuprofen, Motrin, Advil, Goody's, BC's, all herbal medications, fish oil, and non-prescription vitamins.              WHAT DO I DO ABOUT MY DIABETES MEDICATION?   Do not take oral diabetes medicines  glipiZIDE (GLUCOTROL XL) the morning of surgery.    The day of surgery, do not take other diabetes injectables, including Byetta (exenatide), Bydureon (exenatide ER), Victoza (liraglutide), or Trulicity (dulaglutide).  If your CBG is greater than 220 mg/dL, you may take  of your sliding scale (correction) dose of insulin.   HOW TO MANAGE YOUR DIABETES BEFORE AND AFTER SURGERY  Why is it important to control my blood sugar before and after surgery? Improving blood sugar levels before and after surgery helps healing and can limit problems. A way of improving blood sugar control is eating a healthy diet by:  Eating less sugar and  carbohydrates  Increasing activity/exercise  Talking with your doctor about reaching your blood sugar goals High blood sugars (greater than 180 mg/dL) can raise your risk of infections and slow your recovery, so you will need to focus on controlling your diabetes during the weeks before surgery. Make sure that the doctor who takes care of your diabetes knows about your planned surgery including the date and location.  How do I manage my blood sugar before surgery? Check your blood sugar at least 4 times a day, starting 2 days before surgery, to make sure that the level is not too high or low.  Check your blood sugar the morning of your surgery when you wake up and every 2 hours until you get to the Short Stay unit.  If your blood sugar is less than 70 mg/dL, you will need to treat for low blood sugar: Do not take insulin. Treat a low blood sugar (less than 70 mg/dL) with  cup of clear juice (cranberry or apple), 4 glucose tablets, OR glucose gel. Recheck blood sugar in 15 minutes after treatment (to make sure it is greater than 70 mg/dL). If your blood sugar is not greater than 70 mg/dL on recheck, call [phone] for further instructions. Report your blood sugar to the short stay nurse when you get to Short Stay.  If you are admitted to the hospital after surgery: Your blood sugar will be checked by the staff and you will probably be given insulin after surgery (instead of oral diabetes medicines) to make sure you have good blood sugar levels. The goal for  blood sugar control after surgery is 80-180 mg/dL.        Do NOT Smoke (Tobacco/Vaping) for 24 hours prior to your procedure.  If you use a CPAP at night, you may bring your mask/headgear for your overnight stay.   You will be asked to remove any contacts, glasses, piercing's, hearing aid's, dentures/partials prior to surgery. Please bring cases for these items if needed.    Patients discharged the day of surgery will not be allowed  to drive home, and someone needs to stay with them for 24 hours.  SURGICAL WAITING ROOM VISITATION Patients may have no more than 2 support people in the waiting area - these visitors may rotate.   Pre-op nurse will coordinate an appropriate time for 1 ADULT support person, who may not rotate, to accompany patient in pre-op.  Children under the age of 57 must have an adult with them who is not the patient and must remain in the main waiting area with an adult.  If the patient needs to stay at the hospital during part of their recovery, the visitor guidelines for inpatient rooms apply.  Please refer to the [facility] website for the visitor guidelines for any additional information.   If you received a COVID test during your pre-op visit  it is requested that you wear a mask when out in public, stay away from anyone that may not be feeling well and notify your surgeon if you develop symptoms. If you have been in contact with anyone that has tested positive in the last 10 days please notify you surgeon.      Pre-operative 5 CHG Bathing Instructions   You can play a key role in reducing the risk of infection after surgery. Your skin needs to be as free of germs as possible. You can reduce the number of germs on your skin by washing with CHG (chlorhexidine gluconate) soap before surgery. CHG is an antiseptic soap that kills germs and continues to kill germs even after washing.   DO NOT use if you have an allergy to chlorhexidine/CHG or antibacterial soaps. If your skin becomes reddened or irritated, stop using the CHG and notify one of our RNs at [phone].   Please shower with the CHG soap starting 4 days before surgery using the following schedule:     Please keep in mind the following:  DO NOT shave, including legs and underarms, starting the day of your first shower.   You may shave your face at any point before/day of surgery.  Place clean sheets on your bed the day you start using  CHG soap. Use a clean washcloth (not used since being washed) for each shower. DO NOT sleep with pets once you start using the CHG.   CHG Shower Instructions:  Wash your face and private area with normal soap. If you choose to wash your hair, wash first with your normal shampoo.  After you use shampoo/soap, rinse your hair and body thoroughly to remove shampoo/soap residue.  Turn the water OFF and apply about 3 tablespoons (45 ml) of CHG soap to a CLEAN washcloth.  Apply CHG soap ONLY FROM YOUR NECK DOWN TO YOUR TOES (washing for 3-5 minutes)  DO NOT use CHG soap on face, private areas, open wounds, or sores.  Pay special attention to the area where your surgery is being performed.  If you are having back surgery, having someone wash your back for you may be helpful. Wait 2 minutes after CHG soap  is applied, then you may rinse off the CHG soap.  Pat dry with a clean towel  Put on clean clothes/pajamas   If you choose to wear lotion, please use ONLY the CHG-compatible lotions that are listed below.  Additional instructions for the day of surgery: DO NOT APPLY any lotions, deodorants, cologne, or perfumes.   Do not bring valuables to the hospital. [facility] is not responsible for any belongings/valuables. Do not wear nail polish, gel polish, artificial nails, or any other type of covering on natural nails (fingers and toes) Do not wear jewelry or makeup Put on clean/comfortable clothes.  Please brush your teeth.  Ask your nurse before applying any prescription medications to the skin.     CHG Compatible Lotions   Aveeno Moisturizing lotion  Cetaphil Moisturizing Cream  Cetaphil Moisturizing Lotion  Clairol Herbal Essence Moisturizing Lotion, Dry Skin  Clairol Herbal Essence Moisturizing Lotion, Extra Dry Skin  Clairol Herbal Essence Moisturizing Lotion, Normal Skin  Curel Age Defying Therapeutic Moisturizing Lotion with Alpha Hydroxy  Curel Extreme Care Body Lotion  Curel  Soothing Hands Moisturizing Hand Lotion  Curel Therapeutic Moisturizing Cream, Fragrance-Free  Curel Therapeutic Moisturizing Lotion, Fragrance-Free  Curel Therapeutic Moisturizing Lotion, Original Formula  Eucerin Daily Replenishing Lotion  Eucerin Dry Skin Therapy Plus Alpha Hydroxy Crme  Eucerin Dry Skin Therapy Plus Alpha Hydroxy Lotion  Eucerin Original Crme  Eucerin Original Lotion  Eucerin Plus Crme Eucerin Plus Lotion  Eucerin TriLipid Replenishing Lotion  Keri Anti-Bacterial Hand Lotion  Keri Deep Conditioning Original Lotion Dry Skin Formula Softly Scented  Keri Deep Conditioning Original Lotion, Fragrance Free Sensitive Skin Formula  Keri Lotion Fast Absorbing Fragrance Free Sensitive Skin Formula  Keri Lotion Fast Absorbing Softly Scented Dry Skin Formula  Keri Original Lotion  Keri Skin Renewal Lotion Keri Silky Smooth Lotion  Keri Silky Smooth Sensitive Skin Lotion  Nivea Body Creamy Conditioning Oil  Nivea Body Extra Enriched Lotion  Nivea Body Original Lotion  Nivea Body Sheer Moisturizing Lotion Nivea Crme  Nivea Skin Firming Lotion  NutraDerm 30 Skin Lotion  NutraDerm Skin Lotion  NutraDerm Therapeutic Skin Cream  NutraDerm Therapeutic Skin Lotion  ProShield Protective Hand Cream  Provon moisturizing lotion  Please read over the following fact sheets that you were given.

## 2023-11-02 NOTE — Progress Notes (Signed)
 Surgical Instructions    Your procedure is scheduled on November 16, 2023.  Report to [facility] Main Entrance "A" at 10:45 A.M., then check in with the Admitting office.  Call this number if you have problems the morning of surgery:  [phone]  If you have any questions prior to your surgery date call [phone]: Open Monday-Friday 8am-4pm If you experience any cold or flu symptoms such as cough, fever, chills, shortness of breath, etc. between now and your scheduled surgery, please notify us at the above number.     Remember:  Do not eat or drink after midnight the night before your surgery      Take these medicines the morning of surgery with A SIP OF WATER  ALPRAZolam (XANAX)  buPROPion (WELLBUTRIN XL)  escitalopram (LEXAPRO)  loratadine (CLARITIN)  tamsulosin (FLOMAX)   IF NEEDED cyclobenzaprine (FLEXERIL)  oxyCODONE-acetaminophen (PERCOCET)   warfarin (COUMADIN) PLEASE REACH OUT TO YOUR SURGEON FOR INSTRUCTIONS REGARDING COUMADIN  As of today, STOP taking any Aspirin (unless otherwise instructed by your surgeon) Aleve, Naproxen, Ibuprofen, Motrin, Advil, Goody's, BC's, all herbal medications, fish oil, and all vitamins. THIS INCLUDES YOUR diclofenac Sodium (VOLTAREN).     WHAT DO I DO ABOUT MY DIABETES MEDICATION?   Do not take oral diabetes medicines  glipiZIDE (GLUCOTROL XL) the morning of surgery.    The day of surgery, do not take other diabetes injectables, including Byetta (exenatide), Bydureon (exenatide ER), Victoza (liraglutide), or Trulicity (dulaglutide).  If your CBG is greater than 220 mg/dL, you may take  of your sliding scale (correction) dose of insulin.   HOW TO MANAGE YOUR DIABETES BEFORE AND AFTER SURGERY  Why is it important to control my blood sugar before and after surgery? Improving blood sugar levels before and after surgery helps healing and can limit problems. A way of improving blood sugar control is eating a healthy diet by:  Eating  less sugar and carbohydrates  Increasing activity/exercise  Talking with your doctor about reaching your blood sugar goals High blood sugars (greater than 180 mg/dL) can raise your risk of infections and slow your recovery, so you will need to focus on controlling your diabetes during the weeks before surgery. Make sure that the doctor who takes care of your diabetes knows about your planned surgery including the date and location.  How do I manage my blood sugar before surgery? Check your blood sugar at least 4 times a day, starting 2 days before surgery, to make sure that the level is not too high or low.  Check your blood sugar the morning of your surgery when you wake up and every 2 hours until you get to the Short Stay unit.  If your blood sugar is less than 70 mg/dL, you will need to treat for low blood sugar: Do not take insulin. Treat a low blood sugar (less than 70 mg/dL) with  cup of clear juice (cranberry or apple), 4 glucose tablets, OR glucose gel. Recheck blood sugar in 15 minutes after treatment (to make sure it is greater than 70 mg/dL). If your blood sugar is not greater than 70 mg/dL on recheck, call [phone] for further instructions. Report your blood sugar to the short stay nurse when you get to Short Stay.  If you are admitted to the hospital after surgery: Your blood sugar will be checked by the staff and you will probably be given insulin after surgery (instead of oral diabetes medicines) to make sure you have good blood sugar levels.  The goal for blood sugar control after surgery is 80-180 mg/dL.                 Do NOT Smoke (Tobacco/Vaping) for 24 hours prior to your procedure.  If you use a CPAP at night, you may bring your mask/headgear for your overnight stay.   Contacts, glasses, piercing's, hearing aid's, dentures or partials may not be worn into surgery, please bring cases for these belongings.    For patients admitted to the hospital, discharge time  will be determined by your treatment team.   Patients discharged the day of surgery will not be allowed to drive home, and someone needs to stay with them for 24 hours.  SURGICAL WAITING ROOM VISITATION Patients having surgery or a procedure may have no more than 2 support people in the waiting area - these visitors may rotate.   Children under the age of 78 must have an adult with them who is not the patient. If the patient needs to stay at the hospital during part of their recovery, the visitor guidelines for inpatient rooms apply. Pre-op nurse will coordinate an appropriate time for 1 support person to accompany patient in pre-op.  This support person may not rotate.   Please refer to the [facility] website for the visitor guidelines for Inpatients (after your surgery is over and you are in a regular room).    Special instructions:   [place]- Preparing For Surgery  Before surgery, you can play an important role. Because skin is not sterile, your skin needs to be as free of germs as possible. You can reduce the number of germs on your skin by washing with CHG (chlorahexidine gluconate) Soap before surgery.  CHG is an antiseptic cleaner which kills germs and bonds with the skin to continue killing germs even after washing.    Oral Hygiene is also important to reduce your risk of infection.  Remember - BRUSH YOUR TEETH THE MORNING OF SURGERY WITH YOUR REGULAR TOOTHPASTE  Please do not use if you have an allergy to CHG or antibacterial soaps. If your skin becomes reddened/irritated stop using the CHG.  Do not shave (including legs and underarms) for at least 48 hours prior to first CHG shower. It is OK to shave your face.  Please follow these instructions carefully.   Shower the NIGHT BEFORE SURGERY and the MORNING OF SURGERY  If you chose to wash your hair, wash your hair first as usual with your normal shampoo.  After you shampoo, rinse your hair and body thoroughly to remove the  shampoo.  Use CHG Soap as you would any other liquid soap. You can apply CHG directly to the skin and wash gently with a scrungie or a clean washcloth.   Apply the CHG Soap to your body ONLY FROM THE NECK DOWN.  Do not use on open wounds or open sores. Avoid contact with your eyes, ears, mouth and genitals (private parts). Wash Face and genitals (private parts)  with your normal soap.   Wash thoroughly, paying special attention to the area where your surgery will be performed.  Thoroughly rinse your body with warm water from the neck down.  DO NOT shower/wash with your normal soap after using and rinsing off the CHG Soap.  Pat yourself dry with a CLEAN TOWEL.  Wear CLEAN PAJAMAS to bed the night before surgery  Place CLEAN SHEETS on your bed the night before your surgery  DO NOT SLEEP WITH PETS.  Day of Surgery: Take a shower with CHG soap. Do not wear jewelry or makeup Do not wear lotions, powders, perfumes/colognes, or deodorant. Do not shave 48 hours prior to surgery.  Men may shave face and neck. Do not bring valuables to the hospital.  [facility] is not responsible for any belongings or valuables. Do not wear nail polish, gel polish, artificial nails, or any other type of covering on natural nails (fingers and toes) If you have artificial nails or gel coating that need to be removed by a nail salon, please have this removed prior to surgery. Artificial nails or gel coating may interfere with anesthesia's ability to adequately monitor your vital signs. Wear Clean/Comfortable clothing the morning of surgery Remember to brush your teeth WITH YOUR REGULAR TOOTHPASTE.   Please read over the following fact sheets that you were given.    If you received a COVID test during your pre-op visit  it is requested that you wear a mask when out in public, stay away from anyone that may not be feeling well and notify your surgeon if you develop symptoms. If you have been in contact with  anyone that has tested positive in the last 10 days please notify you surgeon.

## 2023-11-03 NOTE — Progress Notes (Addendum)
 Anesthesia Chart Review:  Case: [id number] Date/Time: 11/16/23 1229   Procedure: ACDF,IP,PLATE/SREWS M57,Q46,N62;XBMWUXL - 3C   Anesthesia type: General   Pre-op diagnosis: CERVICAL SPONDYLOSIS WITH RADICULOPATHY   Location: [facility] ROOM 20 / [facility] OR   Surgeons: Tressie Stalker, MD       DISCUSSION: Patient is a 68 year old male scheduled for the above procedure.   History includes former smoker (quit 08/12/79), antiphospholipid syndrome/lupus anticoagulant positive, RLE DVT ( ~ 2005; non-obstructive left popliteal artery thrombus 12/03/05, HTN, DM2, lymphoma (as of 07/21/23: stage I E high-grade B-cell lymphoma currently in CR 1; s/p IFRT, R-CHOP x 3 ending 09/2020), HTN, spinal surgery (C5-7 ACDF 06/12/10; L3-5 PLIF 02/11/23, had post-operative urinary retention), nephrolithiasis (04/26/20).  Continue surveillance of stage I E diffuse large B-cell lymphoma at 07/21/23 follow-up with [facility] Dr. Smith Robert.   Last PCP follow-up visit with Dr. Hyacinth Meeker was on 07/27/23. A1c 7.5%, Creatinine 1.4. 11/02/23 Creatinine stable at 1.42. A1c up to 7.9%. He is on glipizide 10 mg daily.   Due to his history Lovenox bridge while warfarin on hold advised by primary care. He says he has already been given instructions.   Anesthesia team to evaluate on the day of surgery. PT/INR on arrival.   VS: BP (!) 134/93   Pulse 83   Temp 36.8 C   Resp 20   Ht 6\' 2"  (1.88 m)   Wt 105.1 kg   SpO2 96%   BMI 29.74 kg/m   PROVIDERS: Danella Penton, MD is PCP  Owens Shark, MD is [facility]   LABS: Labs reviewed: Acceptable for surgery. LFTs normal on 07/21/23.  (all labs ordered are listed, but only abnormal results are displayed)  Labs Reviewed  GLUCOSE, CAPILLARY - Abnormal; Notable for the following components:      Result Value   Glucose-Capillary 165 (*)    All other components within normal limits  HEMOGLOBIN A1C - Abnormal; Notable for the following components:   Hgb A1c MFr Bld 7.9 (*)    All other components  within normal limits  BASIC METABOLIC PANEL - Abnormal; Notable for the following components:   Glucose, Bld 181 (*)    Creatinine, Ser 1.42 (*)    GFR, Estimated 54 (*)    All other components within normal limits  CBC - Abnormal; Notable for the following components:   RBC 5.90 (*)    All other components within normal limits  SURGICAL PCR SCREEN  TYPE AND SCREEN     IMAGES: CT C-spine 12/26/22: IMPRESSION: 1. C5-C7 ACDF with solid arthrodesis at C6-7. 2. Pseudoarthrosis at C5-6 with back out of the C5 screws. 3. Mild spinal stenosis and severe bilateral neural foraminal stenosis at C3-4. 4. Severe left neural foraminal stenosis at C4-5. 5. Moderate neural foraminal stenosis on the left at C5-6 and on the right at C7-T1.  CT Chest/abd/pelvis 11/14/22: IMPRESSION: 1. No lymphadenopathy in the chest, abdomen or pelvis. 2. Small area of nodular tissue adjacent to the proximal right ureter, decreased in size when compared with the prior exam, consistent with treated disease. 3. Aortic Atherosclerosis (ICD10-I70.0).    EKG: 11/02/23: Normal sinus rhythm Left axis deviation Abnormal ECG Left axis deviation New since previous tracing Confirmed by Olga Millers ([id number]) on 11/02/2023 3:47:21 PM   CV: Echo 07/26/20: IMPRESSIONS   1. Left ventricular ejection fraction, by estimation, is 60 to 65%. The  left ventricle has normal function. The left ventricle has no regional  wall motion abnormalities. Left ventricular diastolic  parameters are  consistent with Grade I diastolic  dysfunction (impaired relaxation). The average left ventricular global  longitudinal strain is -16.8 %. The global longitudinal strain is normal.   2. Right ventricular systolic function is normal. The right ventricular  size is normal.   Stress Echo 06/10/18 ([facility]): INTERPRETATION  Normal Stress Echocardiogram  NORMAL RIGHT VENTRICULAR SYSTOLIC FUNCTION  MILD VALVULAR REGURGITATION (See above)   NO VALVULAR STENOSIS NOTED   Past Medical History:  Diagnosis Date   Anxiety    Arthritis    Collagen vascular disease (HCC)    Depression    Diabetes mellitus without complication (HCC)    DVT (deep venous thrombosis) (HCC)    Headache    occasionally   History of kidney stones    Hypertension    Lupus anticoagulant disorder (HCC)    Lymphoma (HCC) 07/2020   Sleep apnea    Umbilical hernia     Past Surgical History:  Procedure Laterality Date   CERVICAL FUSION     CYSTOSCOPY W/ RETROGRADES Left 04/26/2020   Procedure: CYSTOSCOPY WITH RETROGRADE PYELOGRAM;  Surgeon: Vanna Scotland, MD;  Location: [facility] ORS;  Service: Urology;  Laterality: Left;   CYSTOSCOPY WITH URETEROSCOPY AND STENT PLACEMENT Right 07/02/2020   Procedure: CYSTOSCOPY WITH URETEROSCOPY AND STENT PLACEMENT;  Surgeon: Vanna Scotland, MD;  Location: [facility] ORS;  Service: Urology;  Laterality: Right;   CYSTOSCOPY/URETEROSCOPY/HOLMIUM LASER/STENT PLACEMENT Right 04/26/2020   Procedure: CYSTOSCOPY/URETEROSCOPY/HOLMIUM LASER/STENT PLACEMENT;  Surgeon: Vanna Scotland, MD;  Location: [facility] ORS;  Service: Urology;  Laterality: Right;   IR IMAGING GUIDED PORT INSERTION  07/27/2020   IR REMOVAL TUN ACCESS W/ PORT W/O FL MOD SED  02/08/2021   PLANTAR FASCIA SURGERY Bilateral    ROTATOR CUFF REPAIR Left    THUMB FUSION Right    URETERAL BIOPSY Right 04/26/2020   Procedure: URETERAL BIOPSY with ablation;  Surgeon: Vanna Scotland, MD;  Location: [facility] ORS;  Service: Urology;  Laterality: Right;   URETEROSCOPY WITH HOLMIUM LASER LITHOTRIPSY      MEDICATIONS:  ALPRAZolam (XANAX) 0.5 MG tablet   buPROPion (WELLBUTRIN XL) 150 MG 24 hr tablet   clindamycin-benzoyl peroxide (BENZACLIN) gel   diclofenac Sodium (VOLTAREN) 1 % GEL   docusate sodium (COLACE) 100 MG capsule   [START ON 11/12/2023] enoxaparin (LOVENOX) 100 MG/ML injection   ergocalciferol (VITAMIN D2) 1.25 MG (50000 UT) capsule   escitalopram (LEXAPRO) 20 MG tablet    gabapentin (NEURONTIN) 300 MG capsule   glipiZIDE (GLUCOTROL XL) 10 MG 24 hr tablet   loratadine (CLARITIN) 10 MG tablet   oxyCODONE-acetaminophen (PERCOCET) 5-325 MG tablet   rosuvastatin (CRESTOR) 10 MG tablet   warfarin (COUMADIN) 5 MG tablet   No current facility-administered medications for this encounter.    Shonna Chock, PA-C Surgical Short Stay/Anesthesiology [facility] Phone [phone] [facility] Phone [phone] 11/03/2023 3:17 PM

## 2023-11-03 NOTE — Anesthesia Preprocedure Evaluation (Addendum)
 Anesthesia Evaluation  Patient identified by MRN, date of birth, ID band Patient awake    Reviewed: Allergy & Precautions, H&P , NPO status , Patient's Chart, lab work & pertinent test results  Airway Mallampati: II  TM Distance: >3 FB Neck ROM: Full    Dental no notable dental hx. (+) Teeth Intact, Dental Advisory Given   Pulmonary sleep apnea , former smoker   Pulmonary exam normal breath sounds clear to auscultation       Cardiovascular hypertension, Pt. on medications  Rhythm:Regular Rate:Normal     Neuro/Psych  Headaches  Anxiety Depression       GI/Hepatic negative GI ROS, Neg liver ROS,,,  Endo/Other  diabetes, Type 2, Oral Hypoglycemic Agents    Renal/GU Renal disease  negative genitourinary   Musculoskeletal  (+) Arthritis , Osteoarthritis,    Abdominal   Peds  Hematology negative hematology ROS (+)   Anesthesia Other Findings   Reproductive/Obstetrics negative OB ROS                             Anesthesia Physical Anesthesia Plan  ASA: 3  Anesthesia Plan: General   Post-op Pain Management: Tylenol PO (pre-op)*   Induction: Intravenous  PONV Risk Score and Plan: 3 and Ondansetron, Dexamethasone and Midazolam  Airway Management Planned: Oral ETT  Additional Equipment:   Intra-op Plan:   Post-operative Plan: Extubation in OR  Informed Consent: I have reviewed the patients History and Physical, chart, labs and discussed the procedure including the risks, benefits and alternatives for the proposed anesthesia with the patient or authorized representative who has indicated his/her understanding and acceptance.     Dental advisory given  Plan Discussed with: CRNA  Anesthesia Plan Comments: (PAT note written 11/03/2023 by Shonna Chock, PA-C.  )       Anesthesia Quick Evaluation

## 2023-11-05 ENCOUNTER — Inpatient Hospital Stay (HOSPITAL_COMMUNITY): Admission: RE | Admit: 2023-11-05 | Source: Ambulatory Visit

## 2023-11-16 ENCOUNTER — Other Ambulatory Visit: Payer: Self-pay

## 2023-11-16 ENCOUNTER — Inpatient Hospital Stay (HOSPITAL_COMMUNITY)
Admission: RE | Admit: 2023-11-16 | Discharge: 2023-11-17 | DRG: 472 | Disposition: A | Discharge status: Home / Self Care | Payer: Medicare Other | Attending: Neurosurgery | Admitting: Neurosurgery

## 2023-11-16 ENCOUNTER — Inpatient Hospital Stay (HOSPITAL_COMMUNITY)

## 2023-11-16 ENCOUNTER — Encounter (HOSPITAL_COMMUNITY)
Admission: RE | Disposition: A | Discharge status: Home / Self Care | Payer: Self-pay | Source: Home / Self Care | Attending: Neurosurgery

## 2023-11-16 ENCOUNTER — Inpatient Hospital Stay (HOSPITAL_COMMUNITY): Payer: Self-pay | Admitting: Vascular Surgery

## 2023-11-16 ENCOUNTER — Encounter (HOSPITAL_COMMUNITY): Payer: Self-pay | Admitting: Neurosurgery

## 2023-11-16 DIAGNOSIS — Z87891 Personal history of nicotine dependence: Secondary | ICD-10-CM | POA: Diagnosis not present

## 2023-11-16 DIAGNOSIS — M4722 Other spondylosis with radiculopathy, cervical region: Secondary | ICD-10-CM | POA: Diagnosis not present

## 2023-11-16 DIAGNOSIS — Z87442 Personal history of urinary calculi: Secondary | ICD-10-CM

## 2023-11-16 DIAGNOSIS — I10 Essential (primary) hypertension: Secondary | ICD-10-CM

## 2023-11-16 DIAGNOSIS — M960 Pseudarthrosis after fusion or arthrodesis: Secondary | ICD-10-CM | POA: Diagnosis present

## 2023-11-16 DIAGNOSIS — Z885 Allergy status to narcotic agent status: Secondary | ICD-10-CM | POA: Diagnosis not present

## 2023-11-16 DIAGNOSIS — D6862 Lupus anticoagulant syndrome: Secondary | ICD-10-CM | POA: Diagnosis present

## 2023-11-16 DIAGNOSIS — Z7984 Long term (current) use of oral hypoglycemic drugs: Secondary | ICD-10-CM | POA: Diagnosis not present

## 2023-11-16 DIAGNOSIS — E119 Type 2 diabetes mellitus without complications: Secondary | ICD-10-CM | POA: Diagnosis present

## 2023-11-16 DIAGNOSIS — Z981 Arthrodesis status: Secondary | ICD-10-CM | POA: Diagnosis not present

## 2023-11-16 DIAGNOSIS — G4733 Obstructive sleep apnea (adult) (pediatric): Secondary | ICD-10-CM

## 2023-11-16 DIAGNOSIS — M542 Cervicalgia: Secondary | ICD-10-CM | POA: Diagnosis present

## 2023-11-16 DIAGNOSIS — Z79899 Other long term (current) drug therapy: Secondary | ICD-10-CM | POA: Diagnosis not present

## 2023-11-16 DIAGNOSIS — M5412 Radiculopathy, cervical region: Secondary | ICD-10-CM | POA: Diagnosis present

## 2023-11-16 DIAGNOSIS — Y838 Other surgical procedures as the cause of abnormal reaction of the patient, or of later complication, without mention of misadventure at the time of the procedure: Secondary | ICD-10-CM | POA: Diagnosis present

## 2023-11-16 DIAGNOSIS — Z7901 Long term (current) use of anticoagulants: Secondary | ICD-10-CM | POA: Diagnosis not present

## 2023-11-16 DIAGNOSIS — S129XXA Fracture of neck, unspecified, initial encounter: Principal | ICD-10-CM | POA: Diagnosis present

## 2023-11-16 DIAGNOSIS — E1169 Type 2 diabetes mellitus with other specified complication: Secondary | ICD-10-CM

## 2023-11-16 DIAGNOSIS — Z888 Allergy status to other drugs, medicaments and biological substances status: Secondary | ICD-10-CM | POA: Diagnosis not present

## 2023-11-16 DIAGNOSIS — M479 Spondylosis, unspecified: Secondary | ICD-10-CM | POA: Diagnosis present

## 2023-11-16 DIAGNOSIS — Z01818 Encounter for other preprocedural examination: Secondary | ICD-10-CM

## 2023-11-16 HISTORY — PX: ANTERIOR CERVICAL DECOMP/DISCECTOMY FUSION: SHX1161

## 2023-11-16 HISTORY — PX: HARDWARE REMOVAL: SHX979

## 2023-11-16 LAB — GLUCOSE, CAPILLARY
Glucose-Capillary: 167 mg/dL — ABNORMAL HIGH (ref 70–99)
Glucose-Capillary: 146 mg/dL — ABNORMAL HIGH (ref 70–99)
Glucose-Capillary: 174 mg/dL — ABNORMAL HIGH (ref 70–99)
Glucose-Capillary: 204 mg/dL — ABNORMAL HIGH (ref 70–99)
Glucose-Capillary: 203 mg/dL — ABNORMAL HIGH (ref 70–99)
Glucose-Capillary: 238 mg/dL — ABNORMAL HIGH (ref 70–99)

## 2023-11-16 LAB — PROTIME-INR
INR: 1.1 (ref 0.8–1.2)
Prothrombin Time: 14.1 s (ref 11.4–15.2)

## 2023-11-16 SURGERY — ANTERIOR CERVICAL DECOMPRESSION/DISCECTOMY FUSION 3 LEVELS
Anesthesia: General | Site: Spine Cervical

## 2023-11-16 MED ORDER — THROMBIN 5000 UNITS EX KIT
PACK | CUTANEOUS | Status: AC
  Filled 2023-11-16: qty 1

## 2023-11-16 MED ORDER — ACETAMINOPHEN 500 MG PO TABS
1000.0000 mg | ORAL_TABLET | Freq: Four times a day (QID) | ORAL | Status: DC
  Filled 2023-11-16: qty 2

## 2023-11-16 MED ORDER — PHENYLEPHRINE 80 MCG/ML (10ML) SYRINGE FOR IV PUSH (FOR BLOOD PRESSURE SUPPORT)
PREFILLED_SYRINGE | INTRAVENOUS | Status: AC
  Filled 2023-11-16: qty 10

## 2023-11-16 MED ORDER — GABAPENTIN 300 MG PO CAPS
300.0000 mg | ORAL_CAPSULE | Freq: Two times a day (BID) | ORAL | Status: DC
  Administered 2023-11-16 – 2023-11-17 (×2): 300 mg via ORAL
  Filled 2023-11-16 (×2): qty 1

## 2023-11-16 MED ORDER — CEFAZOLIN SODIUM-DEXTROSE 2-4 GM/100ML-% IV SOLN
2.0000 g | Freq: Three times a day (TID) | INTRAVENOUS | Status: AC
  Administered 2023-11-16 – 2023-11-17 (×2): 2 g via INTRAVENOUS
  Filled 2023-11-16 (×2): qty 100

## 2023-11-16 MED ORDER — ALUM & MAG HYDROXIDE-SIMETH 200-200-20 MG/5ML PO SUSP: 30.0000 mL | Freq: Four times a day (QID) | ORAL | Status: DC | PRN

## 2023-11-16 MED ORDER — FENTANYL CITRATE (PF) 250 MCG/5ML IJ SOLN
INTRAMUSCULAR | Status: DC | PRN
  Administered 2023-11-16: 50 ug via INTRAVENOUS
  Administered 2023-11-16 (×2): 25 ug via INTRAVENOUS
  Administered 2023-11-16: 100 ug via INTRAVENOUS
  Administered 2023-11-16: 50 ug via INTRAVENOUS

## 2023-11-16 MED ORDER — DEXAMETHASONE SODIUM PHOSPHATE 10 MG/ML IJ SOLN
INTRAMUSCULAR | Status: AC
  Filled 2023-11-16: qty 1

## 2023-11-16 MED ORDER — DEXAMETHASONE 4 MG PO TABS
4.0000 mg | ORAL_TABLET | Freq: Four times a day (QID) | ORAL | Status: AC
  Administered 2023-11-16 – 2023-11-17 (×2): 4 mg via ORAL
  Filled 2023-11-16 (×2): qty 1

## 2023-11-16 MED ORDER — CHLORHEXIDINE GLUCONATE 0.12 % MT SOLN
15.0000 mL | Freq: Once | OROMUCOSAL | Status: AC
  Administered 2023-11-16: 15 mL via OROMUCOSAL
  Filled 2023-11-16: qty 15

## 2023-11-16 MED ORDER — PANTOPRAZOLE SODIUM 40 MG IV SOLR: 40.0000 mg | Freq: Every day | INTRAVENOUS | Status: DC

## 2023-11-16 MED ORDER — ALPRAZOLAM 0.5 MG PO TABS: 0.5000 mg | ORAL_TABLET | Freq: Two times a day (BID) | ORAL | Status: DC | PRN

## 2023-11-16 MED ORDER — PROPOFOL 10 MG/ML IV BOLUS
INTRAVENOUS | Status: DC | PRN
  Administered 2023-11-16: 120 mg via INTRAVENOUS

## 2023-11-16 MED ORDER — LIDOCAINE 2% (20 MG/ML) 5 ML SYRINGE
INTRAMUSCULAR | Status: DC | PRN
  Administered 2023-11-16: 60 mg via INTRAVENOUS

## 2023-11-16 MED ORDER — LACTATED RINGERS IV SOLN: INTRAVENOUS | Status: DC

## 2023-11-16 MED ORDER — PHENOL 1.4 % MT LIQD: 1.0000 | OROMUCOSAL | Status: DC | PRN

## 2023-11-16 MED ORDER — ACETAMINOPHEN 650 MG RE SUPP: 650.0000 mg | RECTAL | Status: DC | PRN

## 2023-11-16 MED ORDER — ONDANSETRON HCL 4 MG PO TABS: 4.0000 mg | ORAL_TABLET | Freq: Four times a day (QID) | ORAL | Status: DC | PRN

## 2023-11-16 MED ORDER — INSULIN ASPART 100 UNIT/ML IJ SOLN
0.0000 [IU] | Freq: Three times a day (TID) | INTRAMUSCULAR | Status: DC
  Administered 2023-11-17: 7 [IU] via SUBCUTANEOUS

## 2023-11-16 MED ORDER — FENTANYL CITRATE (PF) 250 MCG/5ML IJ SOLN
INTRAMUSCULAR | Status: AC
  Filled 2023-11-16: qty 5

## 2023-11-16 MED ORDER — ACETAMINOPHEN 325 MG PO TABS: 650.0000 mg | ORAL_TABLET | ORAL | Status: DC | PRN

## 2023-11-16 MED ORDER — HYDROMORPHONE HCL 1 MG/ML IJ SOLN
INTRAMUSCULAR | Status: AC
Start: 2023-11-16 — End: ?
  Filled 2023-11-16: qty 0.5

## 2023-11-16 MED ORDER — BUPIVACAINE-EPINEPHRINE (PF) 0.5% -1:200000 IJ SOLN
INTRAMUSCULAR | Status: AC
  Filled 2023-11-16: qty 30

## 2023-11-16 MED ORDER — INSULIN ASPART 100 UNIT/ML IJ SOLN
0.0000 [IU] | INTRAMUSCULAR | Status: AC | PRN
  Administered 2023-11-16: 4 [IU] via SUBCUTANEOUS
  Administered 2023-11-16 (×2): 2 [IU] via SUBCUTANEOUS

## 2023-11-16 MED ORDER — BUPIVACAINE-EPINEPHRINE (PF) 0.5% -1:200000 IJ SOLN
INTRAMUSCULAR | Status: DC | PRN
  Administered 2023-11-16: 10 mL

## 2023-11-16 MED ORDER — PROPOFOL 10 MG/ML IV BOLUS
INTRAVENOUS | Status: AC
  Filled 2023-11-16: qty 20

## 2023-11-16 MED ORDER — MIDAZOLAM HCL 2 MG/2ML IJ SOLN
INTRAMUSCULAR | Status: AC
  Filled 2023-11-16: qty 2

## 2023-11-16 MED ORDER — BISACODYL 10 MG RE SUPP: 10.0000 mg | Freq: Every day | RECTAL | Status: DC | PRN

## 2023-11-16 MED ORDER — ONDANSETRON HCL 4 MG/2ML IJ SOLN
INTRAMUSCULAR | Status: AC
  Filled 2023-11-16: qty 2

## 2023-11-16 MED ORDER — ROCURONIUM BROMIDE 10 MG/ML (PF) SYRINGE
PREFILLED_SYRINGE | INTRAVENOUS | Status: AC
  Filled 2023-11-16: qty 10

## 2023-11-16 MED ORDER — MENTHOL 3 MG MT LOZG
1.0000 | LOZENGE | OROMUCOSAL | Status: DC | PRN
  Administered 2023-11-16: 3 mg via ORAL
  Filled 2023-11-16: qty 9

## 2023-11-16 MED ORDER — OXYCODONE-ACETAMINOPHEN 5-325 MG PO TABS
1.0000 | ORAL_TABLET | ORAL | Status: DC | PRN
  Administered 2023-11-16 – 2023-11-17 (×3): 2 via ORAL
  Filled 2023-11-16: qty 2
  Filled 2023-11-16: qty 1
  Filled 2023-11-16: qty 2
  Filled 2023-11-16: qty 1

## 2023-11-16 MED ORDER — DOCUSATE SODIUM 100 MG PO CAPS
100.0000 mg | ORAL_CAPSULE | Freq: Two times a day (BID) | ORAL | Status: DC
  Administered 2023-11-16 – 2023-11-17 (×2): 100 mg via ORAL
  Filled 2023-11-16 (×2): qty 1

## 2023-11-16 MED ORDER — CYCLOBENZAPRINE HCL 10 MG PO TABS: 10.0000 mg | ORAL_TABLET | Freq: Three times a day (TID) | ORAL | Status: DC | PRN

## 2023-11-16 MED ORDER — CEFAZOLIN SODIUM 1 G IJ SOLR
INTRAMUSCULAR | Status: AC
  Filled 2023-11-16: qty 20

## 2023-11-16 MED ORDER — THROMBIN 5000 UNITS EX SOLR: OROMUCOSAL | Status: DC | PRN

## 2023-11-16 MED ORDER — INSULIN ASPART 100 UNIT/ML IJ SOLN
INTRAMUSCULAR | Status: AC
  Filled 2023-11-16: qty 1

## 2023-11-16 MED ORDER — BUPROPION HCL ER (XL) 150 MG PO TB24
150.0000 mg | ORAL_TABLET | Freq: Every day | ORAL | Status: DC
  Administered 2023-11-17: 150 mg via ORAL
  Filled 2023-11-16: qty 1

## 2023-11-16 MED ORDER — BACITRACIN ZINC 500 UNIT/GM EX OINT
TOPICAL_OINTMENT | CUTANEOUS | Status: AC
Start: 2023-11-16 — End: ?
  Filled 2023-11-16: qty 28.35

## 2023-11-16 MED ORDER — ONDANSETRON HCL 4 MG/2ML IJ SOLN
INTRAMUSCULAR | Status: DC | PRN
  Administered 2023-11-16: 4 mg via INTRAVENOUS

## 2023-11-16 MED ORDER — LACTATED RINGERS IV SOLN: INTRAVENOUS | Status: DC | PRN

## 2023-11-16 MED ORDER — DEXAMETHASONE SODIUM PHOSPHATE 4 MG/ML IJ SOLN: 4.0000 mg | Freq: Four times a day (QID) | INTRAMUSCULAR | Status: AC

## 2023-11-16 MED ORDER — LORATADINE 10 MG PO TABS
10.0000 mg | ORAL_TABLET | Freq: Every day | ORAL | Status: DC
  Administered 2023-11-16 – 2023-11-17 (×2): 10 mg via ORAL
  Filled 2023-11-16 (×2): qty 1

## 2023-11-16 MED ORDER — SUGAMMADEX SODIUM 200 MG/2ML IV SOLN
INTRAVENOUS | Status: DC | PRN
  Administered 2023-11-16: 200 mg via INTRAVENOUS

## 2023-11-16 MED ORDER — DEXAMETHASONE SODIUM PHOSPHATE 10 MG/ML IJ SOLN
INTRAMUSCULAR | Status: DC | PRN
  Administered 2023-11-16: 5 mg via INTRAVENOUS

## 2023-11-16 MED ORDER — ROSUVASTATIN CALCIUM 5 MG PO TABS
10.0000 mg | ORAL_TABLET | Freq: Every day | ORAL | Status: DC
  Administered 2023-11-16: 10 mg via ORAL
  Filled 2023-11-16: qty 2

## 2023-11-16 MED ORDER — GLIPIZIDE ER 10 MG PO TB24
10.0000 mg | ORAL_TABLET | Freq: Every day | ORAL | Status: DC
  Administered 2023-11-17: 10 mg via ORAL
  Filled 2023-11-16: qty 1

## 2023-11-16 MED ORDER — ACETAMINOPHEN 500 MG PO TABS
1000.0000 mg | ORAL_TABLET | Freq: Once | ORAL | Status: AC
  Administered 2023-11-16: 1000 mg via ORAL
  Filled 2023-11-16: qty 2

## 2023-11-16 MED ORDER — HYDROMORPHONE HCL 1 MG/ML IJ SOLN: 0.2500 mg | INTRAMUSCULAR | Status: DC | PRN

## 2023-11-16 MED ORDER — 0.9 % SODIUM CHLORIDE (POUR BTL) OPTIME
TOPICAL | Status: DC | PRN
  Administered 2023-11-16: 1000 mL

## 2023-11-16 MED ORDER — CHLORHEXIDINE GLUCONATE CLOTH 2 % EX PADS: 6.0000 | MEDICATED_PAD | Freq: Once | CUTANEOUS | Status: DC

## 2023-11-16 MED ORDER — ORAL CARE MOUTH RINSE: 15.0000 mL | Freq: Once | OROMUCOSAL | Status: AC

## 2023-11-16 MED ORDER — ONDANSETRON HCL 4 MG/2ML IJ SOLN: 4.0000 mg | Freq: Four times a day (QID) | INTRAMUSCULAR | Status: DC | PRN

## 2023-11-16 MED ORDER — PHENYLEPHRINE HCL-NACL 20-0.9 MG/250ML-% IV SOLN
INTRAVENOUS | Status: DC | PRN
  Administered 2023-11-16: 10 ug/min via INTRAVENOUS
  Administered 2023-11-16: 80 ug via INTRAVENOUS

## 2023-11-16 MED ORDER — HYDROMORPHONE HCL 1 MG/ML IJ SOLN
INTRAMUSCULAR | Status: DC | PRN
  Administered 2023-11-16: .25 mg via INTRAVENOUS

## 2023-11-16 MED ORDER — CEFAZOLIN SODIUM-DEXTROSE 2-4 GM/100ML-% IV SOLN
2.0000 g | INTRAVENOUS | Status: AC
Start: 2023-11-16 — End: 2023-11-16
  Administered 2023-11-16 (×2): 2 g via INTRAVENOUS
  Filled 2023-11-16: qty 100

## 2023-11-16 MED ORDER — ESCITALOPRAM OXALATE 20 MG PO TABS
20.0000 mg | ORAL_TABLET | Freq: Every day | ORAL | Status: DC
  Administered 2023-11-17: 20 mg via ORAL
  Filled 2023-11-16: qty 1

## 2023-11-16 MED ORDER — ROCURONIUM BROMIDE 10 MG/ML (PF) SYRINGE
PREFILLED_SYRINGE | INTRAVENOUS | Status: DC | PRN
  Administered 2023-11-16 (×2): 10 mg via INTRAVENOUS
  Administered 2023-11-16: 30 mg via INTRAVENOUS
  Administered 2023-11-16 (×2): 10 mg via INTRAVENOUS
  Administered 2023-11-16: 20 mg via INTRAVENOUS
  Administered 2023-11-16: 60 mg via INTRAVENOUS

## 2023-11-16 MED ORDER — LIDOCAINE 2% (20 MG/ML) 5 ML SYRINGE
INTRAMUSCULAR | Status: AC
  Filled 2023-11-16: qty 5

## 2023-11-16 MED ORDER — INSULIN ASPART 100 UNIT/ML IJ SOLN: 0.0000 [IU] | Freq: Three times a day (TID) | INTRAMUSCULAR | Status: DC

## 2023-11-16 MED ORDER — ALBUMIN HUMAN 5 % IV SOLN: INTRAVENOUS | Status: DC | PRN

## 2023-11-16 MED ORDER — INSULIN ASPART 100 UNIT/ML IJ SOLN
0.0000 [IU] | Freq: Every day | INTRAMUSCULAR | Status: DC
  Administered 2023-11-16: 2 [IU] via SUBCUTANEOUS

## 2023-11-16 MED ORDER — MIDAZOLAM HCL 2 MG/2ML IJ SOLN
INTRAMUSCULAR | Status: DC | PRN
  Administered 2023-11-16: 2 mg via INTRAVENOUS

## 2023-11-16 MED ORDER — MORPHINE SULFATE (PF) 2 MG/ML IV SOLN: 2.0000 mg | INTRAVENOUS | Status: DC | PRN

## 2023-11-16 MED ORDER — PANTOPRAZOLE SODIUM 40 MG PO TBEC
40.0000 mg | DELAYED_RELEASE_TABLET | Freq: Every day | ORAL | Status: DC
  Administered 2023-11-16: 40 mg via ORAL
  Filled 2023-11-16: qty 1

## 2023-11-16 SURGICAL SUPPLY — 50 items
BAG COUNTER SPONGE SURGICOUNT (BAG) ×2 IMPLANT
BAND RUBBER #18 3X1/16 STRL (MISCELLANEOUS) ×4 IMPLANT
BENZOIN TINCTURE PRP APPL 2/3 (GAUZE/BANDAGES/DRESSINGS) ×4 IMPLANT
BIT DRILL NEURO 2X3.1 SFT TUCH (MISCELLANEOUS) ×2 IMPLANT
BLADE SURG 15 STRL LF DISP TIS (BLADE) ×2 IMPLANT
BLADE ULTRA TIP 2M (BLADE) ×2 IMPLANT
BUR BARREL STRAIGHT FLUTE 4.0 (BURR) ×2 IMPLANT
BUR MATCHSTICK NEURO 3.0 LAGG (BURR) ×2 IMPLANT
CANISTER SUCT 3000ML PPV (MISCELLANEOUS) ×2 IMPLANT
COVER MAYO STAND STRL (DRAPES) ×2 IMPLANT
DRAPE LAPAROTOMY 100X72 PEDS (DRAPES) ×2 IMPLANT
DRAPE MICROSCOPE SLANT 54X150 (MISCELLANEOUS) IMPLANT
DRAPE SURG 17X23 STRL (DRAPES) ×4 IMPLANT
DRILL NEURO 2X3.1 SOFT TOUCH (MISCELLANEOUS) ×2 IMPLANT
DRSG OPSITE POSTOP 3X4 (GAUZE/BANDAGES/DRESSINGS) ×2 IMPLANT
DRSG OPSITE POSTOP 4X6 (GAUZE/BANDAGES/DRESSINGS) IMPLANT
ELECT REM PT RETURN 9FT ADLT (ELECTROSURGICAL) ×2 IMPLANT
ELECTRODE REM PT RTRN 9FT ADLT (ELECTROSURGICAL) ×2 IMPLANT
GAUZE 4X4 16PLY ~~LOC~~+RFID DBL (SPONGE) IMPLANT
GLOVE BIO SURGEON STRL SZ 6.5 (GLOVE) ×2 IMPLANT
GLOVE BIO SURGEON STRL SZ8 (GLOVE) ×2 IMPLANT
GLOVE BIO SURGEON STRL SZ8.5 (GLOVE) ×2 IMPLANT
GLOVE BIOGEL PI IND STRL 6.5 (GLOVE) ×2 IMPLANT
GLOVE EXAM NITRILE XL STR (GLOVE) IMPLANT
GOWN STRL REUS W/ TWL LRG LVL3 (GOWN DISPOSABLE) ×2 IMPLANT
GOWN STRL REUS W/ TWL XL LVL3 (GOWN DISPOSABLE) ×2 IMPLANT
HEMOSTAT POWDER KIT SURGIFOAM (HEMOSTASIS) ×2 IMPLANT
KIT BASIN OR (CUSTOM PROCEDURE TRAY) ×2 IMPLANT
KIT TURNOVER KIT B (KITS) ×2 IMPLANT
MARKER SKIN DUAL TIP RULER LAB (MISCELLANEOUS) ×2 IMPLANT
NDL HYPO 22X1.5 SAFETY MO (MISCELLANEOUS) ×2 IMPLANT
NDL SPNL 18GX3.5 QUINCKE PK (NEEDLE) ×2 IMPLANT
NEEDLE HYPO 22X1.5 SAFETY MO (MISCELLANEOUS) ×2 IMPLANT
NEEDLE SPNL 18GX3.5 QUINCKE PK (NEEDLE) ×2 IMPLANT
NS IRRIG 1000ML POUR BTL (IV SOLUTION) ×2 IMPLANT
PACK LAMINECTOMY NEURO (CUSTOM PROCEDURE TRAY) ×2 IMPLANT
PIN DISTRACTION 14MM (PIN) ×4 IMPLANT
PLATE ANT CERV XTEND 3 LV 48 (Plate) IMPLANT
PUTTY DBM 10CC CALC GRAN (Putty) IMPLANT
SCREW VAR 4.2 XD SELF DRILL 14 (Screw) IMPLANT
SCREW XTD VAR 4.2 SELF TAP (Screw) IMPLANT
SPACER HEDRON C 12X14X7 0D (Spacer) IMPLANT
SPACER HEDRON C 12X14X8 0D (Spacer) IMPLANT
SPONGE INTESTINAL PEANUT (DISPOSABLE) ×4 IMPLANT
STRIP CLOSURE SKIN 1/2X4 (GAUZE/BANDAGES/DRESSINGS) ×2 IMPLANT
SUT VIC AB 0 CT1 27XBRD ANTBC (SUTURE) ×2 IMPLANT
SUT VIC AB 3-0 SH 8-18 (SUTURE) ×2 IMPLANT
TOWEL GREEN STERILE (TOWEL DISPOSABLE) ×2 IMPLANT
TOWEL GREEN STERILE FF (TOWEL DISPOSABLE) ×2 IMPLANT
WATER STERILE IRR 1000ML POUR (IV SOLUTION) ×2 IMPLANT

## 2023-11-16 NOTE — Progress Notes (Signed)
 Orthopedic Tech Progress Note Patient Details:  Eddie Ryan January 09, 1956 [id number]  Ortho Devices Type of Ortho Device: Aspen cervical collar Ortho Device/Splint Location: at bedside with PACU RN Ortho Device/Splint Interventions: Ordered      Docia Furl 11/16/2023, 7:12 PM

## 2023-11-16 NOTE — Op Note (Signed)
 Brief history: The patient is a 68 year old white male on whom another physician performed a C5-6 and C6-7 anterior cervical discectomy fusion and plating years ago.  He has developed recurrent neck and arm pain consistent with a cervical radiculopathy.  He was worked up with a cervical myelo CT which demonstrated findings consistent with a cervical pseudoarthrosis as well as cervical spondylosis, stenosis, etc.  I discussed the various treatment options with him.  He has decided proceed with surgery.  Preoperative diagnosis: Cervical pseudoarthrosis, cervicalgia, cervical spondylosis, cervical radiculopathy  Postoperative diagnosis: The same  Procedure: Exploration of cervical fusion/removal of cervical hardware; exploration of cervical fusion/removal of cervical hardware; C3-4, C4-5 and redo C5-6 anterior cervical discectomy/decompression; C3-4, C4-5 and C5-6 interbody arthrodesis with local morcellized autograft bone and Zimmer DBM; insertion of interbody prosthesis at C3-4, C4-5 and C5-6 (globus titanium interbody prosthesis); anterior cervical plating from C3-C6 with globus titanium plate  Surgeon: Dr. Delma Officer  Asst.: Dr. Coletta Memos and Hildred Priest, NP  Anesthesia: Gen. endotracheal  Estimated blood loss: 150 cc  Drains: 10 mm flat Jackson-Pratt drain in the prevertebral space  Complications: None  Description of procedure: The patient was brought to the operating room by the anesthesia team. General endotracheal anesthesia was induced. A roll was placed under the patient's shoulders to keep the neck in the neutral position. The patient's anterior cervical region was then prepared with Betadine scrub and Betadine solution. Sterile drapes were applied.  The area to be incised was then injected with Marcaine with epinephrine solution. I then used a scalpel to make a transverse incision in the patient's left anterior neck. I used the Metzenbaum scissors to dissect through the scar  tissue and to divide the platysmal muscle and then to dissect medial to the sternocleidomastoid muscle, jugular vein, and carotid artery. I carefully dissected down towards the anterior cervical spine identifying the esophagus and retracting it medially. Then using Kitner swabs to clear soft tissue from the anterior cervical spine and to expose the old cervical plate from X3-K4.  I inserted the Caspar retractor for exposure.  I explored the fusion by removing the cervical plate and screws from C5-C7.  As expected the solo C5 had partially backed up the plate. There was a pseudoarthrosis at C5-6.  I then used electrocautery to detach the medial border of the longus colli muscle bilaterally from the C3-4, C4-5 and C5-6 intervertebral disc spaces. I then inserted the Caspar self-retaining retractor underneath the longus colli muscle bilaterally to provide exposure.  We then incised the intervertebral disc at C3-4. We then performed a partial intervertebral discectomy with a pituitary forceps and the Karlin curettes. I then inserted distraction screws into the vertebral bodies at C3-4. We then distracted the interspace. We then used the high-speed drill to decorticate the vertebral endplates at C3-4, to drill away the remainder of the intervertebral disc, to drill away some posterior spondylosis, and to thin out the posterior longitudinal ligament. I then incised ligament with the arachnoid knife. We then removed the ligament with a Kerrison punches undercutting the vertebral endplates and decompressing the thecal sac. We then performed foraminotomies about the bilateral C4 nerve roots. This completed the decompression at this level.  I then repeated this procedure and was fashion at C4-5 decompression of thecal sac and about all C5 nerve roots.  I also drilled out the pseudoarthrosis at C5-6 decompressing the thecal sac and performing foraminotomies about the bilateral C6 nerve root with a Kerrison  punches.  We now  turned our to attention to the interbody fusion. We used the trial spacers to determine the appropriate size for the interbody prosthesis. We then pre-filled prosthesis with a combination of local morcellized autograft bone that we obtained during decompression as well as Zimmer DBM. We then inserted the titanium prosthesis into the distracted interspace at C3-4, C4-5 and C5-6. We then removed the distraction screws. There was a good snug fit of the prosthesis in the interspaces.  Having completed the fusion we now turned attention to the anterior spinal instrumentation. We used the high-speed drill to drill away some anterior spondylosis at the disc spaces so that the plate lay down flat. We selected the appropriate length titanium anterior cervical plate. We laid it along the anterior aspect of the vertebral bodies from C3-C6. We then drilled 14 mm holes at C3, C4, C5 and C6. We then secured the plate to the vertebral bodies by placing two 14 mm self-tapping screws at C3, C4, C5 and C6. We then obtained intraoperative radiograph. The demonstrating good position of the upper instrumentation.  We cannot see the lower plate and screws very well because of the patient's shoulders.  The instrumentation look good in vivo.  We therefore secured the screws the plate the locking each cam. This completed the instrumentation from C3-C6.  We then obtained hemostasis using bipolar electrocautery. We irrigated the wound out with bacitracin solution. We then removed the retractor. We inspected the esophagus for any damage. There was none apparent.  We placed a 10 mm flat Jackson-Pratt drain in the prevertebral space and tunneled it out through a separate stab wound.  We then reapproximated patient's platysmal muscle with interrupted 3-0 Vicryl suture. We then reapproximated the subcutaneous tissue with interrupted 3-0 Vicryl suture. The skin was reapproximated with Steri-Strips and benzoin. The wound was  then covered with bacitracin ointment. A sterile dressing was applied. The drapes were removed. Patient was subsequently extubated by the anesthesia team and transported to the post anesthesia care unit in stable condition. All sponge instrument and needle counts were reportedly correct at the end of this case.

## 2023-11-16 NOTE — Transfer of Care (Signed)
 Immediate Anesthesia Transfer of Care Note  Patient: Eddie Ryan  Procedure(s) Performed: CERVICAL THREE-FOUR, CERVICAL FOUR-FIVE, CERVICAL FIVE-SIX ANTERIOR CERVICAL DECOMPRESSION FUSION (Spine Cervical) REMOVAL OF PREVIOUS ANTERIOR CERVICAL FUSION HARDWARE (Spine Cervical)  Patient Location: PACU  Anesthesia Type:General  Level of Consciousness: awake, alert , and oriented  Airway & Oxygen Therapy: Patient Spontanous Breathing and Patient connected to face mask oxygen  Post-op Assessment: Report given to RN and Post -op Vital signs reviewed and stable  Post vital signs: Reviewed and stable  Last Vitals:  Vitals Value Taken Time  BP 126/67 11/16/23 1715  Temp    Pulse 73 11/16/23 1715  Resp 9 11/16/23 1715  SpO2 88 % 11/16/23 1715  Vitals shown include unfiled device data.  Last Pain:  Vitals:   11/16/23 1127  TempSrc:   PainSc: 4       Patients Stated Pain Goal: 2 (11/16/23 1111)  Complications: No notable events documented.

## 2023-11-16 NOTE — H&P (Signed)
 Subjective: The patient is a 68 year old white male whose had a previous cervical fusion.  He has developed recurrent neck pain into his right shoulder and arm.  He has failed medical management and was worked up with a cervical myelo CT which demonstrated cervical spondylosis and cervical pseudoarthrosis.  I discussed the various treatment options with him.  He has decided to proceed with surgery.  Past Medical History:  Diagnosis Date   Anxiety    Arthritis    Collagen vascular disease (HCC)    Depression    Diabetes mellitus without complication (HCC)    DVT (deep venous thrombosis) (HCC)    Headache    occasionally   History of kidney stones    Hypertension    Lupus anticoagulant disorder (HCC)    Lymphoma (HCC) 07/2020   Sleep apnea    Umbilical hernia     Past Surgical History:  Procedure Laterality Date   CERVICAL FUSION     CYSTOSCOPY W/ RETROGRADES Left 04/26/2020   Procedure: CYSTOSCOPY WITH RETROGRADE PYELOGRAM;  Surgeon: Vanna Scotland, MD;  Location: [facility];  Service: Urology;  Laterality: Left;   CYSTOSCOPY WITH URETEROSCOPY AND STENT PLACEMENT Right 07/02/2020   Procedure: CYSTOSCOPY WITH URETEROSCOPY AND STENT PLACEMENT;  Surgeon: Vanna Scotland, MD;  Location: [facility];  Service: Urology;  Laterality: Right;   CYSTOSCOPY/URETEROSCOPY/HOLMIUM LASER/STENT PLACEMENT Right 04/26/2020   Procedure: CYSTOSCOPY/URETEROSCOPY/HOLMIUM LASER/STENT PLACEMENT;  Surgeon: Vanna Scotland, MD;  Location: [facility];  Service: Urology;  Laterality: Right;   IR IMAGING GUIDED PORT INSERTION  07/27/2020   IR REMOVAL TUN ACCESS W/ PORT W/O FL MOD SED  02/08/2021   PLANTAR FASCIA SURGERY Bilateral    ROTATOR CUFF REPAIR Left    THUMB FUSION Right    URETERAL BIOPSY Right 04/26/2020   Procedure: URETERAL BIOPSY with ablation;  Surgeon: Vanna Scotland, MD;  Location: [facility];  Service: Urology;  Laterality: Right;   URETEROSCOPY WITH HOLMIUM LASER LITHOTRIPSY      Allergies   Allergen Reactions   Losartan Potassium-Hctz Other (See Comments)    Unknown reaction.   Morphine And Codeine Itching    WITH PROLONGED USE    Social History   Tobacco Use   Smoking status: Former    Current packs/day: 0.00    Types: Cigarettes    Quit date: 1981    Years since quitting: 44.2   Smokeless tobacco: Never  Substance Use Topics   Alcohol use: Never    Family History  Problem Relation Age of Onset   Dementia Mother    Pancreatic cancer Father    Prostate cancer Paternal Grandmother    Leukemia Nephew    Prior to Admission medications   Medication Sig Start Date End Date Taking? Authorizing Provider  ALPRAZolam Prudy Feeler) 0.5 MG tablet Take 0.5 mg by mouth in the morning and at bedtime.  01/29/20  Yes [provider]  buPROPion (WELLBUTRIN XL) 150 MG 24 hr tablet Take 150 mg by mouth daily. 01/29/20  Yes [provider]  docusate sodium (COLACE) 100 MG capsule Take 200 mg by mouth daily.   Yes [provider]  enoxaparin (LOVENOX) 100 MG/ML injection Inject into the skin. 11/12/23 11/19/23 Yes [provider]  escitalopram (LEXAPRO) 20 MG tablet Take 20 mg by mouth daily.  01/29/20  Yes [provider]  gabapentin (NEURONTIN) 300 MG capsule Take 300 mg by mouth 2 (two) times daily.   Yes [provider]  glipiZIDE (GLUCOTROL XL) 10 MG 24 hr tablet Take  10 mg by mouth daily with breakfast. 06/07/20  Yes [provider]  loratadine (CLARITIN) 10 MG tablet Take 10 mg by mouth daily.    Yes [provider]  rosuvastatin (CRESTOR) 10 MG tablet Take 10 mg by mouth at bedtime. 01/31/21  Yes [provider]  warfarin (COUMADIN) 5 MG tablet Take 5-7.5 mg by mouth See admin instructions. Take 5 mg by mouth on Sunday, Monday, Tuesday, Wednesday, Thursday, and Friday. Take 7.5 mg by mouth once daily on Saturday. 03/23/19  Yes [provider]  clindamycin-benzoyl peroxide (BENZACLIN) gel Apply  topically 2 (two) times daily. Patient not taking: Reported on 11/02/2023 07/27/23 07/26/24  [provider]  diclofenac Sodium (VOLTAREN) 1 % GEL Apply 1 Application topically daily as needed (thumb as needed for pain.).    [provider]  ergocalciferol (VITAMIN D2) 1.25 MG (50000 UT) capsule Take 50,000 Units by mouth every Monday.  Patient not taking: Reported on 11/02/2023    [provider]  oxyCODONE-acetaminophen (PERCOCET) 5-325 MG tablet Take 1-2 tablets by mouth every 4 (four) hours as needed for moderate pain or severe pain. Patient taking differently: Take 0.25 tablets by mouth daily as needed for moderate pain (pain score 4-6) or severe pain (pain score 7-10). 02/18/23   Tressie Stalker, MD     Review of Systems  Positive ROS: As above  All other systems have been reviewed and were otherwise negative with the exception of those mentioned in the HPI and as above.  Objective: Vital signs in last 24 hours: Temp:  [97.7 F (36.5 C)] 97.7 F (36.5 C) (04/07 1056) Pulse Rate:  [73] 73 (04/07 1056) Resp:  [20] 20 (04/07 1056) BP: (148)/(91) 148/91 (04/07 1056) SpO2:  [92 %] 92 % (04/07 1056) Weight:  [104.8 kg] 104.8 kg (04/07 1056) Estimated body mass index is 29.66 kg/m as calculated from the following:   Height as of this encounter: 6\' 2"  (1.88 m).   Weight as of this encounter: 104.8 kg.   General Appearance: Alert Head: Normocephalic, without obvious abnormality, atraumatic Eyes: PERRL, conjunctiva/corneas clear, EOM's intact,    Ears: Normal  Throat: Normal  Neck: Supple, Back: unremarkable Lungs: Clear to auscultation bilaterally, respirations unlabored Heart: Regular rate and rhythm, no murmur, rub or gallop Abdomen: Soft, non-tender Extremities: Extremities normal, atraumatic, no cyanosis or edema Skin: unremarkable  NEUROLOGIC:   Mental status: alert and oriented,Motor Exam - grossly normal Sensory Exam - grossly  normal Reflexes:  Coordination - grossly normal Gait - grossly normal Balance - grossly normal Cranial Nerves: I: smell Not tested  II: visual acuity  OS: Normal  OD: Normal   II: visual fields Full to confrontation  II: pupils Equal, round, reactive to light  III,VII: ptosis None  III,IV,VI: extraocular muscles  Full ROM  V: mastication Normal  V: facial light touch sensation  Normal  V,VII: corneal reflex  Present  VII: facial muscle function - upper  Normal  VII: facial muscle function - lower Normal  VIII: hearing Not tested  IX: soft palate elevation  Normal  IX,X: gag reflex Present  XI: trapezius strength  5/5  XI: sternocleidomastoid strength 5/5  XI: neck flexion strength  5/5  XII: tongue strength  Normal    Data Review Lab Results  Component Value Date   WBC 6.1 11/02/2023   HGB 15.8 11/02/2023   HCT 49.3 11/02/2023   MCV 83.6 11/02/2023   PLT 207 11/02/2023   Lab Results  Component Value Date  NA 139 11/02/2023   K 4.8 11/02/2023   CL 105 11/02/2023   CO2 26 11/02/2023   BUN 12 11/02/2023   CREATININE 1.42 (H) 11/02/2023   GLUCOSE 181 (H) 11/02/2023   Lab Results  Component Value Date   INR 1.3 (H) 02/18/2023   PROTIME 30.0 (H) 07/01/2010    Assessment/Plan: Cervical pseudoarthrosis, cervical spondylosis, cervicalgia, cervical radiculopathy: I have discussed the situation with the patient.  I reviewed his spinal CT with him and pointed out the abnormalities.  We have discussed the various treatment options including surgery.  I described the surgical treatment option of next placed on the cervical fusion, removal of old cervical plate at Z6-1 and C4-5 anterior cervical discectomy with a redo cervical decompression at C5-6 with instrumentation and fusion.  I have shown him surgical models.  I have given him a surgical pamphlet.  We have discussed the risk, benefits, alternatives, expected postop course, and likelihood of achieving our goals with  surgery.  I have answered all patient's questions.  He has decided proceed with surgery.   Cristi Loron 11/16/2023 11:36 AM

## 2023-11-16 NOTE — Anesthesia Procedure Notes (Signed)
 Procedure Name: Intubation Date/Time: 11/16/2023 12:37 PM  Performed by: Carolynne Edouard, RNPre-anesthesia Checklist: Patient identified, Emergency Drugs available, Suction available and Patient being monitored Patient Re-evaluated:Patient Re-evaluated prior to induction Oxygen Delivery Method: Circle system utilized Preoxygenation: Pre-oxygenation with 100% oxygen Induction Type: IV induction Ventilation: Mask ventilation without difficulty and Oral airway inserted - appropriate to patient size Laryngoscope Size: Glidescope and 4 Grade View: Grade I Tube type: Oral Tube size: 7.5 mm Number of attempts: 1 Airway Equipment and Method: Stylet and Oral airway Placement Confirmation: ETT inserted through vocal cords under direct vision, positive ETCO2 and breath sounds checked- equal and bilateral Secured at: 22 cm Tube secured with: Tape Dental Injury: Teeth and Oropharynx as per pre-operative assessment  Comments: Head maintained in neutral position

## 2023-11-16 NOTE — Anesthesia Postprocedure Evaluation (Signed)
 Anesthesia Post Note  Patient: ZAI CHMIEL  Procedure(s) Performed: CERVICAL THREE-FOUR, CERVICAL FOUR-FIVE, CERVICAL FIVE-SIX ANTERIOR CERVICAL DECOMPRESSION FUSION (Spine Cervical) REMOVAL OF PREVIOUS ANTERIOR CERVICAL FUSION HARDWARE (Spine Cervical)     Patient location during evaluation: PACU Anesthesia Type: General Level of consciousness: awake and alert Pain management: pain level controlled Vital Signs Assessment: post-procedure vital signs reviewed and stable Respiratory status: spontaneous breathing, nonlabored ventilation, respiratory function stable and patient connected to nasal cannula oxygen Cardiovascular status: blood pressure returned to baseline and stable Postop Assessment: no apparent nausea or vomiting Anesthetic complications: no  No notable events documented.  Last Vitals:  Vitals:   11/16/23 1730 11/16/23 1745  BP: 127/74 (!) 102/59  Pulse: 75 68  Resp: 17 11  Temp:    SpO2: 93% 92%    Last Pain:  Vitals:   11/16/23 1730  TempSrc:   PainSc: Asleep    LLE Motor Response: Purposeful movement (11/16/23 1745) LLE Sensation: Tingling (11/16/23 1745) RLE Motor Response: Purposeful movement (11/16/23 1745) RLE Sensation: Tingling (11/16/23 1745)      Nataly Pacifico,W. EDMOND

## 2023-11-17 ENCOUNTER — Encounter (HOSPITAL_COMMUNITY): Payer: Self-pay | Admitting: Neurosurgery

## 2023-11-17 LAB — GLUCOSE, CAPILLARY: Glucose-Capillary: 220 mg/dL — ABNORMAL HIGH (ref 70–99)

## 2023-11-17 MED ORDER — OXYCODONE-ACETAMINOPHEN 5-325 MG PO TABS
1.0000 | ORAL_TABLET | ORAL | Status: DC | PRN
  Administered 2023-11-17: 1 via ORAL
  Filled 2023-11-17: qty 1

## 2023-11-17 MED ORDER — OXYCODONE-ACETAMINOPHEN 5-325 MG PO TABS: 1.0000 | ORAL_TABLET | ORAL | 0 refills | Status: AC | PRN

## 2023-11-17 MED FILL — Thrombin For Soln 5000 Unit: CUTANEOUS | Qty: 5000 | Status: AC

## 2023-11-17 NOTE — Discharge Summary (Signed)
 Physician Discharge Summary  Patient ID: Eddie Ryan MRN: [id number] DOB/AGE: Dec 25, 1955 68 y.o.  Admit date: 11/16/2023 Discharge date: 11/17/2023  Admission Diagnoses: Cervical pseudoarthrosis, cervical spondylosis, cervical radiculopathy, cervicalgia  Discharge Diagnoses: The same Principal Problem:   Cervical pseudoarthrosis [facility])   Discharged Condition: good  Hospital Course: I performed a C3-4, C4-5 and C5-6 anterior cervicectomy fusion and plating on the patient on 11/16/2023.  The surgery went well.  The patient's postoperative course was unremarkable.  On postoperative day 1 he felt well and requested discharge home.  The patient was given verbal and written discharge instructions.  All his questions were answered.  Consults: OT, care management Significant Diagnostic Studies: None Treatments: Exploration of cervical fusion/removal of cervical hardware; C3-4, C4-5 and C5-6 anterior cervical discectomy fusion and plating. Discharge Exam: Blood pressure 138/84, pulse 69, temperature 98 F (36.7 C), temperature source Oral, resp. rate 17, height 6\' 2"  (1.88 m), weight 104.8 kg, SpO2 95%. The patient is alert and pleasant.  He looks well.  He is dressing is clean and dry.  There is no hematoma or shift.  His strength is normal.  Disposition: Home  Discharge Instructions     Call MD for:  difficulty breathing, headache or visual disturbances   Complete by: As directed    Call MD for:  extreme fatigue   Complete by: As directed    Call MD for:  hives   Complete by: As directed    Call MD for:  persistant dizziness or light-headedness   Complete by: As directed    Call MD for:  persistant nausea and vomiting   Complete by: As directed    Call MD for:  redness, tenderness, or signs of infection (pain, swelling, redness, odor or green/yellow discharge around incision site)   Complete by: As directed    Call MD for:  severe uncontrolled pain   Complete by: As directed    Call  MD for:  temperature >100.4   Complete by: As directed    Diet - low sodium heart healthy   Complete by: As directed    Discharge instructions   Complete by: As directed    Call [phone] for a followup appointment. Take a stool softener while you are using pain medications.   Driving Restrictions   Complete by: As directed    Do not drive for 2 weeks.   Increase activity slowly   Complete by: As directed    Lifting restrictions   Complete by: As directed    Do not lift more than 5 pounds. No excessive bending or twisting.   May shower / Bathe   Complete by: As directed    Remove the dressing for 3 days after surgery.  You may shower, but leave the incision alone.   Remove dressing in 48 hours   Complete by: As directed       Allergies as of 11/17/2023       Reactions   Losartan Potassium-hctz Other (See Comments)   Unknown reaction.   Morphine And Codeine Itching   WITH PROLONGED USE        Medication List     TAKE these medications    ALPRAZolam 0.5 MG tablet Commonly known as: XANAX Take 0.5 mg by mouth in the morning and at bedtime.   buPROPion 150 MG 24 hr tablet Commonly known as: WELLBUTRIN XL Take 150 mg by mouth daily.   clindamycin-benzoyl peroxide gel Commonly known as: BENZACLIN Apply topically 2 (two) times  daily.   diclofenac Sodium 1 % Gel Commonly known as: VOLTAREN Apply 1 Application topically daily as needed (thumb as needed for pain.).   docusate sodium 100 MG capsule Commonly known as: COLACE Take 200 mg by mouth daily.   enoxaparin 100 MG/ML injection Commonly known as: LOVENOX Inject into the skin.   ergocalciferol 1.25 MG (50000 UT) capsule Commonly known as: VITAMIN D2 Take 50,000 Units by mouth every Monday.   escitalopram 20 MG tablet Commonly known as: LEXAPRO Take 20 mg by mouth daily.   gabapentin 300 MG capsule Commonly known as: NEURONTIN Take 300 mg by mouth 2 (two) times daily.   glipiZIDE 10 MG 24 hr  tablet Commonly known as: GLUCOTROL XL Take 10 mg by mouth daily with breakfast.   loratadine 10 MG tablet Commonly known as: CLARITIN Take 10 mg by mouth daily.   oxyCODONE-acetaminophen 5-325 MG tablet Commonly known as: Percocet Take 1-2 tablets by mouth every 4 (four) hours as needed for moderate pain (pain score 4-6) or severe pain (pain score 7-10). What changed:  how much to take when to take this   rosuvastatin 10 MG tablet Commonly known as: CRESTOR Take 10 mg by mouth at bedtime.   warfarin 5 MG tablet Commonly known as: COUMADIN Take 5-7.5 mg by mouth See admin instructions. Take 5 mg by mouth on Sunday, Monday, Tuesday, Wednesday, Thursday, and Friday. Take 7.5 mg by mouth once daily on Saturday.         Signed: Cristi Loron 11/17/2023, 7:48 AM

## 2023-11-17 NOTE — Evaluation (Signed)
 Occupational Therapy Evaluation Patient Details Name: Eddie Ryan MRN: [id number] DOB: 1956/01/31 Today's Date: 11/17/2023   History of Present Illness   Pt is a 68 y.o. male s/p ACDF C3-6 with removal of previous anterior cervical fusion hardware 11/16/2023. PMH significant for ACDF, PLIF, anxiety, arthritis, DMII, DVT, HTN, lupus, sleep apnea.     Clinical Impressions PTA, pt lived alone and reports being independent. Upon eval, pt mod I for BADL. Pt educated and demonstrating compensatory techniques for bed mobility, UB ADL, LB ADL, brace application, toileting, shower transfers, stair training, car transfers, and grooming within precautions. Pt educated regarding mobility recommendations as well. 1-2 cues to avoid bending over during session and for optimal positioning of neck. Brace donned throughout with education provided for changing pads and applying brace. All education provided and questions answered. Recommending discharge home with no follow up OT at this time. OT to sign off. Please re-consult if change in status.       If plan is discharge home, recommend the following:   Other (comment) (on pt request)     Functional Status Assessment   Patient has had a recent decline in their functional status and demonstrates the ability to make significant improvements in function in a reasonable and predictable amount of time.     Equipment Recommendations   None recommended by OT     Recommendations for Other Services         Precautions/Restrictions   Precautions Precautions: Cervical Precaution Booklet Issued: Yes (comment) Recall of Precautions/Restrictions: Intact Precaution/Restrictions Comments: all precautions reviewed within the context of ADL Required Braces or Orthoses: Cervical Brace Cervical Brace: Hard collar (routine orders) Restrictions Weight Bearing Restrictions Per Provider Order: No     Mobility Bed Mobility Overal bed mobility:  Independent, Modified Independent                  Transfers Overall transfer level: Modified independent                        Balance Overall balance assessment: Mild deficits observed, not formally tested                                         ADL either performed or assessed with clinical judgement   ADL Overall ADL's : Modified independent                                             Vision Ability to See in Adequate Light: 0 Adequate Patient Visual Report: No change from baseline Vision Assessment?: No apparent visual deficits     Perception Perception: Within Functional Limits       Praxis Praxis: WFL       Pertinent Vitals/Pain Pain Assessment Pain Assessment: No/denies pain     Extremity/Trunk Assessment Upper Extremity Assessment Upper Extremity Assessment: Overall WFL for tasks assessed (pt reports no pain in RUE at current time, functional strength at shoulder level not formally assessed due to S/p cervical spinal surgery)   Lower Extremity Assessment Lower Extremity Assessment: Overall WFL for tasks assessed   Cervical / Trunk Assessment Cervical / Trunk Assessment: Back Surgery;Neck Surgery   Communication Communication Communication: No apparent difficulties   Cognition Arousal: Alert Behavior During Therapy: [facility]  for tasks assessed/performed Cognition: No apparent impairments                               Following commands: Intact       Cueing  General Comments      VSS. mild drainage from bandaging on anterior neck. Pt asking RN to change bandage. C-collar soiled; educated regarding cleaning and changing of padding   Exercises     Shoulder Instructions      Home Living Family/patient expects to be discharged to:: Private residence Living Arrangements: Alone Available Help at Discharge: Other (Comment) (significant other) Type of Home: House Home Access: Stairs to  enter [employer] of Steps: 3 Entrance Stairs-Rails: Right;Left;Can reach both Home Layout: Two level;1/2 bath on main level Alternate Level Stairs-Number of Steps: 3 + landing + 7 Alternate Level Stairs-Rails: Left;Right Bathroom Shower/Tub: Producer, television/film/video: Handicapped height Bathroom Accessibility: Yes How Accessible: Accessible via walker Home Equipment: Shower seat;Grab bars - tub/shower;BSC/3in1;Rolling Walker (2 wheels);[place] Mor - single point   Additional Comments: lives alone, plans to stay with girlfriend at discharge.      Prior Functioning/Environment Prior Level of Function : Independent/Modified Independent;Working/employed             Mobility Comments: independent ADLs Comments: works with heavy machinery    OT Problem List: Decreased strength;Impaired balance (sitting and/or standing);Decreased activity tolerance;Decreased knowledge of precautions   OT Treatment/Interventions:        OT Goals(Current goals can be found in the care plan section)   Acute Rehab OT Goals Patient Stated Goal: go home OT Goal Formulation: With patient   OT Frequency:       Co-evaluation              AM-PAC OT "6 Clicks" Daily Activity     Outcome Measure Help from another person eating meals?: None Help from another person taking care of personal grooming?: None Help from another person toileting, which includes using toliet, bedpan, or urinal?: None Help from another person bathing (including washing, rinsing, drying)?: None Help from another person to put on and taking off regular upper body clothing?: None Help from another person to put on and taking off regular lower body clothing?: None 6 Click Score: 24   End of Session Equipment Utilized During Treatment: Gait belt;Cervical collar Nurse Communication: Mobility status  Activity Tolerance: Patient tolerated treatment well Patient left: in bed;with call bell/phone within  reach  OT Visit Diagnosis: Unsteadiness on feet (R26.81);Muscle weakness (generalized) (M62.81)                Time: 0454-0981 OT Time Calculation (min): 30 min Charges:  OT General Charges $OT Visit: 1 Visit OT Evaluation $OT Eval Low Complexity: 1 Low OT Treatments $Self Care/Home Management : 8-22 mins  Tyler Deis, OTR/L [facility] Acute Rehabilitation Office: [phone]   Myrla Halsted 11/17/2023, 9:33 AM

## 2023-11-17 NOTE — Progress Notes (Signed)
   11/17/23 1220  AVS Discharge Documentation  AVS Discharge Instructions Including Medications Provided to patient/caregiver  Name of Person Receiving AVS Discharge Instructions Including Medications Eddie Ryan  Name of Clinician That Reviewed AVS Discharge Instructions Including Medications Lafonda Mosses RN    RN went over discharge instructions with the patient at bedside. RN educated patient on medication changes and follow up appointments. Patient verbalize understanding. Site is clean, dry and intact. Transportation en route and patient is getting dressed for discharge.

## 2024-01-19 ENCOUNTER — Inpatient Hospital Stay: Payer: Medicare Other | Attending: Oncology | Admitting: Oncology

## 2024-01-19 ENCOUNTER — Encounter: Payer: Self-pay | Admitting: Oncology

## 2024-01-19 ENCOUNTER — Inpatient Hospital Stay: Payer: Medicare Other

## 2024-01-19 DIAGNOSIS — Z86718 Personal history of other venous thrombosis and embolism: Secondary | ICD-10-CM | POA: Diagnosis not present

## 2024-01-19 DIAGNOSIS — Z87442 Personal history of urinary calculi: Secondary | ICD-10-CM | POA: Diagnosis not present

## 2024-01-19 DIAGNOSIS — Z885 Allergy status to narcotic agent status: Secondary | ICD-10-CM | POA: Diagnosis not present

## 2024-01-19 DIAGNOSIS — Z79899 Other long term (current) drug therapy: Secondary | ICD-10-CM | POA: Diagnosis not present

## 2024-01-19 DIAGNOSIS — Z8579 Personal history of other malignant neoplasms of lymphoid, hematopoietic and related tissues: Secondary | ICD-10-CM

## 2024-01-19 DIAGNOSIS — Z08 Encounter for follow-up examination after completed treatment for malignant neoplasm: Secondary | ICD-10-CM

## 2024-01-19 DIAGNOSIS — Z806 Family history of leukemia: Secondary | ICD-10-CM | POA: Diagnosis not present

## 2024-01-19 DIAGNOSIS — D6862 Lupus anticoagulant syndrome: Secondary | ICD-10-CM | POA: Insufficient documentation

## 2024-01-19 DIAGNOSIS — N200 Calculus of kidney: Secondary | ICD-10-CM | POA: Insufficient documentation

## 2024-01-19 DIAGNOSIS — Z8042 Family history of malignant neoplasm of prostate: Secondary | ICD-10-CM | POA: Insufficient documentation

## 2024-01-19 DIAGNOSIS — C8338 Diffuse large B-cell lymphoma, lymph nodes of multiple sites: Secondary | ICD-10-CM | POA: Diagnosis present

## 2024-01-19 DIAGNOSIS — Z87891 Personal history of nicotine dependence: Secondary | ICD-10-CM | POA: Diagnosis not present

## 2024-01-19 DIAGNOSIS — Z800 Family history of malignant neoplasm of digestive organs: Secondary | ICD-10-CM | POA: Diagnosis not present

## 2024-01-19 DIAGNOSIS — Z7901 Long term (current) use of anticoagulants: Secondary | ICD-10-CM | POA: Insufficient documentation

## 2024-01-19 DIAGNOSIS — E119 Type 2 diabetes mellitus without complications: Secondary | ICD-10-CM | POA: Diagnosis not present

## 2024-01-19 DIAGNOSIS — Z818 Family history of other mental and behavioral disorders: Secondary | ICD-10-CM | POA: Diagnosis not present

## 2024-01-19 DIAGNOSIS — Z9221 Personal history of antineoplastic chemotherapy: Secondary | ICD-10-CM | POA: Diagnosis not present

## 2024-01-19 LAB — CMP (CANCER CENTER ONLY)
ALT: 19 U/L (ref 0–44)
AST: 18 U/L (ref 15–41)
Albumin: 3.8 g/dL (ref 3.5–5.0)
Alkaline Phosphatase: 48 U/L (ref 38–126)
Anion gap: 9 (ref 5–15)
BUN: 18 mg/dL (ref 8–23)
CO2: 25 mmol/L (ref 22–32)
Calcium: 8.6 mg/dL — ABNORMAL LOW (ref 8.9–10.3)
Chloride: 104 mmol/L (ref 98–111)
Creatinine: 1.38 mg/dL — ABNORMAL HIGH (ref 0.61–1.24)
GFR, Estimated: 56 mL/min — ABNORMAL LOW (ref >60)
Glucose, Bld: 124 mg/dL — ABNORMAL HIGH (ref 70–99)
Potassium: 4 mmol/L (ref 3.5–5.1)
Sodium: 138 mmol/L (ref 135–145)
Total Bilirubin: 0.7 mg/dL (ref 0.0–1.2)
Total Protein: 6.5 g/dL (ref 6.5–8.1)

## 2024-01-19 LAB — CBC WITH DIFFERENTIAL (CANCER CENTER ONLY)
Abs Immature Granulocytes: 0.02 10*3/uL (ref 0.00–0.07)
Basophils Absolute: 0 10*3/uL (ref 0.0–0.1)
Basophils Relative: 1 %
Eosinophils Absolute: 0.2 10*3/uL (ref 0.0–0.5)
Eosinophils Relative: 2 %
HCT: 46.1 % (ref 39.0–52.0)
Hemoglobin: 14.8 g/dL (ref 13.0–17.0)
Immature Granulocytes: 0 %
Lymphocytes Relative: 30 %
Lymphs Abs: 2 10*3/uL (ref 0.7–4.0)
MCH: 26.7 pg (ref 26.0–34.0)
MCHC: 32.1 g/dL (ref 30.0–36.0)
MCV: 83.2 fL (ref 80.0–100.0)
Monocytes Absolute: 0.6 10*3/uL (ref 0.1–1.0)
Monocytes Relative: 9 %
Neutro Abs: 3.8 10*3/uL (ref 1.7–7.7)
Neutrophils Relative %: 58 %
Platelet Count: 198 10*3/uL (ref 150–400)
RBC: 5.54 MIL/uL (ref 4.22–5.81)
RDW: 14.3 % (ref 11.5–15.5)
WBC Count: 6.5 10*3/uL (ref 4.0–10.5)
nRBC: 0 % (ref 0.0–0.2)

## 2024-01-19 LAB — LACTATE DEHYDROGENASE: LDH: 145 U/L (ref 98–192)

## 2024-01-19 NOTE — Progress Notes (Signed)
 Hematology/Oncology Consult note [facility]  Telephone:([phone] [phone]  Patient Care Team: Sari Cunning, MD as PCP - General (Internal Medicine) Avonne Boettcher, MD as Consulting Physician (Oncology) Glenis Langdon, MD as Consulting Physician (Radiation Oncology)   Name of the patient: Eddie Ryan  [id number]  [birth year]   Date of visit: 01/19/24  Diagnosis- stage I E high-grade B-cell lymphoma currently in CR 1     Chief complaint/ Reason for visit-routine follow-up visit for diffuse large B-cell lymphoma surveillance  Heme/Onc history: Patient is a 68 year old male with a prior history of DVT about 15 years ago and was diagnosed with antiphospholipid antibody syndrome with a positive lupus anticoagulant.  He has remained on Coumadin  since then.  He has been seeing Dr. Ace Holder for kidney stones.  He had a CT abdomen and pelvis in August 2021 which showed thickening of the urothelium concerning for a urothelial lesion.  He underwent a cystoscopy and ureteroscopy in September 2021 which did not show any intraluminal pathology.  Plan was to get a repeat CT urogram 6 weeks following that.  He had a CT hematuria work-up again in November 2021 which showed enhancing soft tissue lesion in the proximal right ureter measuring 2.3 x 2.3 x 3.5 cm which was extending for 2.6 cm and enlarges compared to prior size of 1.5 x 1.1 x 2.9 cm.  There is no evidence of retroperitoneal adenopathy.  Left external iliac node was 1.2 cm and was unchanged as compared to prior CT scan.  He underwent a repeat ureteroscopy with stent placement.  There was no obvious filling defect but there was a slight narrowing of ureter and patient therefore underwent a CT-guided biopsy by IR   Biopsy showed B-cell lymphoma High-grade with a Ki-67 of greater than 90%. Comment:  Core biopsy sections display lymphoid tissue. There are aggregates  comprised of larger lymphocytes,  interspersed with areas of small mature  lymphocytes, and focal background bands of fibrosis. Typical follicular  architecture is not present.   Immunohistochemical studies show strong and diffuse positivity for CD20,  indicating a B cell proliferation, comprised of both smaller and larger  lymphocytes. CD3 marks scattered background T cells. The subset of  larger B cells shows positivity for CD10, BCL6, and MUM-1 (partial).  These cells are negative for BCL2, Cyclin-D1, and CD30. BCL2 appears to  show marking in the subset of smaller lymphocytes, as well as normal T  cells. Ki-67 is significantly elevated in the subset of larger B cells,  estimated at greater than 90% staining. C-myc shows dim staining in a  portion of the larger B cells, approximately 20-30%. Pancytokeratin and  CD56 are negative.   Flow cytometric studies detect an abnormal CD10+ B cell population in a  background of polytypic B cells. These CD10+ B cells represent 28% of B  cells and show very dim kappa light chain restriction, to negative light  chain expression. By light scatter, these B cells appear approximately  the same size as background T cells. There is no loss of, or aberrant  expression of, pan T-cell antigens to suggest a neoplastic T cell  process. Cell viability in this sample is sufficient for analysis, but  less than optimal.   Overall classification of this process is limited due to the small size  of core biopsy samples. The histologic and immunophenotypic findings are  compatible with involvement by a B cell lymphoma. CD10 expression, as  seen by immunohistochemistry  and flow cytometry, may imply  germinal/follicular center origin of these B cells. Absence of staining  for BCL2 could suggest against follicular lymphoma, although BCL2  expression may be absent in 25-50% of cases of grade 3 follicular  lymphoma. The differential diagnosis may also include diffuse large B  cell lymphoma, given the  increased proportion of larger abnormal B  cells. Further subclassification of this process would be dependent upon  excisional biopsy or review of overall lesional architecture. There is  sufficient tissue for ancillary FISH or NGS testing, if desired.   Bone marrow biopsy was negative for lymphoma.  PET/CT scan showed:A 3.3 x 2.7 cm soft tissue lesion around the right proximal ureter with an SUV of 13.4.  There was no other hypermetabolic abdominopelvic adenopathy noted.  No hypermetabolism noted in the liver or spleen or other organs.  No intrathoracic adenopathy.  No hypermetabolic activity noted in the bones.   Patient underwent 3 cycles of R-CHOP chemotherapy ending in February 2022 followed by involved field radiation.  He remains in remission    Interval history-patient recently underwent neck surgery after undergoing back surgery in December 2024.  He is slowly recovering from that.  He still wears a back brace.  Denies any changes in his appetite.  He is trying to lose weight.  ECOG PS- 1 Pain scale- 0   Review of systems- Review of Systems  Constitutional:  Negative for chills, fever, malaise/fatigue and weight loss.  HENT:  Negative for congestion, ear discharge and nosebleeds.   Eyes:  Negative for blurred vision.  Respiratory:  Negative for cough, hemoptysis, sputum production, shortness of breath and wheezing.   Cardiovascular:  Negative for chest pain, palpitations, orthopnea and claudication.  Gastrointestinal:  Negative for abdominal pain, blood in stool, constipation, diarrhea, heartburn, melena, nausea and vomiting.  Genitourinary:  Negative for dysuria, flank pain, frequency, hematuria and urgency.  Musculoskeletal:  Negative for back pain, joint pain and myalgias.  Skin:  Negative for rash.  Neurological:  Negative for dizziness, tingling, focal weakness, seizures, weakness and headaches.  Endo/Heme/Allergies:  Does not bruise/bleed easily.  Psychiatric/Behavioral:   Negative for depression and suicidal ideas. The patient does not have insomnia.       Allergies  Allergen Reactions   Losartan Potassium-Hctz Other (See Comments)    Unknown reaction.   Morphine  And Codeine Itching    WITH PROLONGED USE     Past Medical History:  Diagnosis Date   Anxiety    Arthritis    Collagen vascular disease (HCC)    Depression    Diabetes mellitus without complication (HCC)    DVT (deep venous thrombosis) (HCC)    Headache    occasionally   History of kidney stones    Hypertension    Lupus anticoagulant disorder (HCC)    Lymphoma (HCC) 07/2020   Sleep apnea    Umbilical hernia      Past Surgical History:  Procedure Laterality Date   ANTERIOR CERVICAL DECOMP/DISCECTOMY FUSION N/A 11/16/2023   Procedure: CERVICAL THREE-FOUR, CERVICAL FOUR-FIVE, CERVICAL FIVE-SIX ANTERIOR CERVICAL DECOMPRESSION FUSION;  Surgeon: Garry Kansas, MD;  Location: [facility] OR;  Service: Neurosurgery;  Laterality: N/A;  3C   CERVICAL FUSION     CYSTOSCOPY W/ RETROGRADES Left 04/26/2020   Procedure: CYSTOSCOPY WITH RETROGRADE PYELOGRAM;  Surgeon: Dustin Gimenez, MD;  Location: [facility] ORS;  Service: Urology;  Laterality: Left;   CYSTOSCOPY WITH URETEROSCOPY AND STENT PLACEMENT Right 07/02/2020   Procedure: CYSTOSCOPY WITH URETEROSCOPY AND STENT PLACEMENT;  Surgeon: Dustin Gimenez, MD;  Location: [facility] ORS;  Service: Urology;  Laterality: Right;   CYSTOSCOPY/URETEROSCOPY/HOLMIUM LASER/STENT PLACEMENT Right 04/26/2020   Procedure: CYSTOSCOPY/URETEROSCOPY/HOLMIUM LASER/STENT PLACEMENT;  Surgeon: Dustin Gimenez, MD;  Location: [facility] ORS;  Service: Urology;  Laterality: Right;   HARDWARE REMOVAL  11/16/2023   Procedure: REMOVAL OF PREVIOUS ANTERIOR CERVICAL FUSION HARDWARE;  Surgeon: Garry Kansas, MD;  Location: [facility] OR;  Service: Neurosurgery;;   IR IMAGING GUIDED PORT INSERTION  07/27/2020   IR REMOVAL TUN ACCESS W/ PORT W/O FL MOD SED  02/08/2021   PLANTAR FASCIA SURGERY Bilateral     ROTATOR CUFF REPAIR Left    THUMB FUSION Right    URETERAL BIOPSY Right 04/26/2020   Procedure: URETERAL BIOPSY with ablation;  Surgeon: Dustin Gimenez, MD;  Location: [facility] ORS;  Service: Urology;  Laterality: Right;   URETEROSCOPY WITH HOLMIUM LASER LITHOTRIPSY      Social History   Socioeconomic History   Marital status: Single    Spouse name: Not on file   Number of children: 0   Years of education: Not on file   Highest education level: Not on file  Occupational History    Comment: heavy Arboriculturist (work for himself)  Tobacco Use   Smoking status: Former    Current packs/day: 0.00    Types: Cigarettes    Quit date: 1981    Years since quitting: 44.4   Smokeless tobacco: Never  Vaping Use   Vaping status: Never Used  Substance and Sexual Activity   Alcohol use: Never   Drug use: Never   Sexual activity: Not Currently  Other Topics Concern   Not on file  Social History Narrative   Lives by himself..   Social Drivers of Health   Financial Resource Strain: Low Risk  (01/29/2023)   Received [facility], [facility]   Overall Financial Resource Strain (CARDIA)    Difficulty of Paying Living Expenses: Not hard at all  Food Insecurity: No Food Insecurity (02/11/2023)   Hunger Vital Sign    Worried About Running Out of Food in the Last Year: Never true    Ran Out of Food in the Last Year: Never true  Transportation Needs: No Transportation Needs (02/11/2023)   PRAPARE - Administrator, Civil Service (Medical): No    Lack of Transportation (Non-Medical): No  Physical Activity: Not on file  Stress: Not on file  Social Connections: Not on file  Intimate Partner Violence: Not At Risk (02/12/2023)   Humiliation, Afraid, Rape, and Kick questionnaire    Fear of Current or Ex-Partner: No    Emotionally Abused: No    Physically Abused: No    Sexually Abused: No    Family History  Problem Relation Age of Onset    Dementia Mother    Pancreatic cancer Father    Prostate cancer Paternal Grandmother    Leukemia Nephew      Current Outpatient Medications:    ALPRAZolam  (XANAX ) 0.5 MG tablet, Take 0.5 mg by mouth in the morning and at bedtime. , Disp: , Rfl:    buPROPion  (WELLBUTRIN  XL) 150 MG 24 hr tablet, Take 150 mg by mouth daily., Disp: , Rfl:    diclofenac Sodium (VOLTAREN) 1 % GEL, Apply 1 Application topically daily as needed (thumb as needed for pain.)., Disp: , Rfl:    docusate sodium  (COLACE) 100 MG capsule, Take 200 mg by mouth daily., Disp: , Rfl:    escitalopram  (LEXAPRO ) 20  MG tablet, Take 20 mg by mouth daily. , Disp: , Rfl:    gabapentin  (NEURONTIN ) 300 MG capsule, Take 300 mg by mouth 2 (two) times daily., Disp: , Rfl:    glipiZIDE  (GLUCOTROL  XL) 10 MG 24 hr tablet, Take 10 mg by mouth daily with breakfast., Disp: , Rfl:    loratadine  (CLARITIN ) 10 MG tablet, Take 10 mg by mouth daily. , Disp: , Rfl:    oxyCODONE -acetaminophen  (PERCOCET) 5-325 MG tablet, Take 1-2 tablets by mouth every 4 (four) hours as needed for moderate pain (pain score 4-6) or severe pain (pain score 7-10)., Disp: 20 tablet, Rfl: 0   rosuvastatin  (CRESTOR ) 10 MG tablet, Take 10 mg by mouth at bedtime., Disp: , Rfl:    warfarin (COUMADIN ) 5 MG tablet, Take 5-7.5 mg by mouth See admin instructions. Take 5 mg by mouth on Sunday, Monday, Tuesday, Wednesday, Thursday, and Friday. Take 7.5 mg by mouth once daily on Saturday., Disp: , Rfl:    clindamycin-benzoyl peroxide (BENZACLIN) gel, Apply topically 2 (two) times daily. (Patient not taking: Reported on 11/02/2023), Disp: , Rfl:    ergocalciferol (VITAMIN D2) 1.25 MG (50000 UT) capsule, Take 50,000 Units by mouth every Monday.  (Patient not taking: Reported on 11/02/2023), Disp: , Rfl:   Physical exam:  Vitals:   01/19/24 1040  BP: (!) 149/97  Pulse: 65  Resp: 16  Temp: (!) 96.7 F (35.9 C)  TempSrc: Tympanic  SpO2: 96%  Weight: 228 lb 6.4 oz (103.6 kg)   Physical  Exam Cardiovascular:     Rate and Rhythm: Normal rate and regular rhythm.     Heart sounds: Normal heart sounds.  Pulmonary:     Effort: Pulmonary effort is normal.     Breath sounds: Normal breath sounds.  Lymphadenopathy:     Comments: No palpable cervical, supraclavicular, axillary or inguinal adenopathy    Skin:    General: Skin is warm and dry.  Neurological:     Mental Status: He is alert and oriented to person, place, and time.      I have personally reviewed labs listed below:    Latest Ref Rng & Units 01/19/2024   10:07 AM  CMP  Glucose 70 - 99 mg/dL 604   BUN 8 - 23 mg/dL 18   Creatinine 5.40 - 1.24 mg/dL 9.81   Sodium 191 - 478 mmol/L 138   Potassium 3.5 - 5.1 mmol/L 4.0   Chloride 98 - 111 mmol/L 104   CO2 22 - 32 mmol/L 25   Calcium  8.9 - 10.3 mg/dL 8.6   Total Protein 6.5 - 8.1 g/dL 6.5   Total Bilirubin 0.0 - 1.2 mg/dL 0.7   Alkaline Phos 38 - 126 U/L 48   AST 15 - 41 U/L 18   ALT 0 - 44 U/L 19       Latest Ref Rng & Units 01/19/2024   10:07 AM  CBC  WBC 4.0 - 10.5 K/uL 6.5   Hemoglobin 13.0 - 17.0 g/dL 29.5   Hematocrit 62.1 - 52.0 % 46.1   Platelets 150 - 400 K/uL 198     Assessment and plan- Patient is a 68 y.o. male  with history of stage I E diffuse large B-cell lymphoma s/p 3 cycles of R-CHOP and IFRT.  He is here for routine surveillance visit for diffuse large B-cell lymphoma  Clinically patient is doing well with no concerning signs and symptoms of recurrence based on today's exam.  He completed chemotherapy for diffuse  large B-cell lymphoma back in February 2022.No indication for surveillance imaging at this time.  His last scan from April 2024 did not show any evidence of recurrence.  I will see him back in 6 months with CBC with differential CMP and LDH   Visit Diagnosis 1. Encounter for follow-up surveillance of diffuse large B-cell lymphoma      Dr. Seretha Dance, MD, MPH [facility] [place] at [facility] [phone] 01/19/2024 1:38 PM

## 2024-07-13 ENCOUNTER — Telehealth: Payer: Self-pay | Admitting: Oncology

## 2024-07-13 NOTE — Telephone Encounter (Signed)
 Pt left vm to r/s appt from 12/10 due to having a procedure at the hospital on this day.   I called and spoke with pt and have r/s the appts to the opening on 12/9. New date/time confirmed with pt

## 2024-07-19 ENCOUNTER — Inpatient Hospital Stay

## 2024-07-19 ENCOUNTER — Inpatient Hospital Stay: Admitting: Oncology

## 2024-07-20 ENCOUNTER — Other Ambulatory Visit: Payer: Self-pay

## 2024-07-20 ENCOUNTER — Encounter: Payer: Self-pay | Admitting: Gastroenterology

## 2024-07-20 ENCOUNTER — Ambulatory Visit: Payer: Self-pay | Admitting: Anesthesiology

## 2024-07-20 ENCOUNTER — Encounter: Payer: Self-pay | Admitting: Anesthesiology

## 2024-07-20 ENCOUNTER — Other Ambulatory Visit

## 2024-07-20 ENCOUNTER — Ambulatory Visit: Admitting: Oncology

## 2024-07-20 ENCOUNTER — Encounter: Admission: RE | Disposition: A | Payer: Self-pay | Source: Home / Self Care | Attending: Gastroenterology

## 2024-07-20 ENCOUNTER — Ambulatory Visit
Admission: RE | Admit: 2024-07-20 | Discharge: 2024-07-20 | Disposition: A | Attending: Gastroenterology | Admitting: Gastroenterology

## 2024-07-20 HISTORY — PX: POLYPECTOMY: SHX149

## 2024-07-20 HISTORY — PX: HEMOSTASIS CLIP PLACEMENT: SHX6857

## 2024-07-20 HISTORY — PX: SUBMUCOSAL INJECTION: SHX5543

## 2024-07-20 HISTORY — PX: COLONOSCOPY: SHX5424

## 2024-07-20 LAB — GLUCOSE, CAPILLARY: Glucose-Capillary: 194 mg/dL — ABNORMAL HIGH (ref 70–99)

## 2024-07-20 SURGERY — COLONOSCOPY
Anesthesia: General

## 2024-07-20 MED ORDER — LIDOCAINE HCL (CARDIAC) PF 100 MG/5ML IV SOSY
PREFILLED_SYRINGE | INTRAVENOUS | Status: DC | PRN
  Administered 2024-07-20: 60 mg via INTRAVENOUS

## 2024-07-20 MED ORDER — SODIUM CHLORIDE (PF) 0.9 % IJ SOLN
INTRAMUSCULAR | Status: DC | PRN
  Administered 2024-07-20: 10 mL via INTRAVENOUS

## 2024-07-20 MED ORDER — PROPOFOL 10 MG/ML IV BOLUS
INTRAVENOUS | Status: DC | PRN
  Administered 2024-07-20: 90 mg via INTRAVENOUS

## 2024-07-20 MED ORDER — PROPOFOL 500 MG/50ML IV EMUL
INTRAVENOUS | Status: DC | PRN
  Administered 2024-07-20: 125 ug/kg/min via INTRAVENOUS

## 2024-07-20 MED ORDER — SODIUM CHLORIDE 0.9 % IV SOLN: INTRAVENOUS | Status: DC

## 2024-07-20 NOTE — H&P (Signed)
 Ruel Kung , MD [address], [address], [place], [place], [address] Phone: [phone] Fax: [phone]  Primary Care Physician:  Cleotilde Oneil FALCON, MD   Pre-Procedure History & Physical: HPI:  Eddie Ryan is a 68 y.o. male is here for an colonoscopy.   Past Medical History:  Diagnosis Date   Anxiety    Arthritis    Collagen vascular disease    Depression    Diabetes mellitus without complication (HCC)    DVT (deep venous thrombosis) (HCC)    Headache    occasionally   History of kidney stones    Hypertension    Lupus anticoagulant disorder    Lymphoma (HCC) 07/2020   Sleep apnea    Umbilical hernia     Past Surgical History:  Procedure Laterality Date   ANTERIOR CERVICAL DECOMP/DISCECTOMY FUSION N/A 11/16/2023   Procedure: CERVICAL THREE-FOUR, CERVICAL FOUR-FIVE, CERVICAL FIVE-SIX ANTERIOR CERVICAL DECOMPRESSION FUSION;  Surgeon: Mavis Purchase, MD;  Location: [facility] OR;  Service: Neurosurgery;  Laterality: N/A;  3C   CERVICAL FUSION     CYSTOSCOPY W/ RETROGRADES Left 04/26/2020   Procedure: CYSTOSCOPY WITH RETROGRADE PYELOGRAM;  Surgeon: Penne Knee, MD;  Location: [facility] ORS;  Service: Urology;  Laterality: Left;   CYSTOSCOPY WITH URETEROSCOPY AND STENT PLACEMENT Right 07/02/2020   Procedure: CYSTOSCOPY WITH URETEROSCOPY AND STENT PLACEMENT;  Surgeon: Penne Knee, MD;  Location: [facility] ORS;  Service: Urology;  Laterality: Right;   CYSTOSCOPY/URETEROSCOPY/HOLMIUM LASER/STENT PLACEMENT Right 04/26/2020   Procedure: CYSTOSCOPY/URETEROSCOPY/HOLMIUM LASER/STENT PLACEMENT;  Surgeon: Penne Knee, MD;  Location: [facility] ORS;  Service: Urology;  Laterality: Right;   HARDWARE REMOVAL  11/16/2023   Procedure: REMOVAL OF PREVIOUS ANTERIOR CERVICAL FUSION HARDWARE;  Surgeon: Mavis Purchase, MD;  Location: [facility] OR;  Service: Neurosurgery;;   IR IMAGING  GUIDED PORT INSERTION  07/27/2020   IR REMOVAL TUN ACCESS W/ PORT W/O FL MOD SED  02/08/2021   PLANTAR FASCIA SURGERY Bilateral    ROTATOR CUFF REPAIR Left    THUMB FUSION Right    URETERAL BIOPSY Right 04/26/2020   Procedure: URETERAL BIOPSY with ablation;  Surgeon: Penne Knee, MD;  Location: [facility] ORS;  Service: Urology;  Laterality: Right;   URETEROSCOPY WITH HOLMIUM LASER LITHOTRIPSY      Prior to Admission medications   Medication Sig Start Date End Date Taking? Authorizing Provider  ALPRAZolam  (XANAX ) 0.5 MG tablet Take 0.5 mg by mouth in the morning and at bedtime.  01/29/20   [provider]  buPROPion  (WELLBUTRIN  XL) 150 MG 24 hr tablet Take 150 mg by mouth daily. 01/29/20   [provider]  clindamycin-benzoyl peroxide (BENZACLIN) gel Apply topically 2 (two) times daily. Patient not taking: Reported on 11/02/2023 07/27/23 07/26/24  [provider]  diclofenac Sodium (VOLTAREN) 1 % GEL Apply 1 Application topically daily as needed (thumb as needed for pain.).    [provider]  docusate sodium  (COLACE) 100 MG capsule Take 200 mg by mouth daily.    [provider]  ergocalciferol (VITAMIN D2) 1.25 MG (50000 UT) capsule Take 50,000 Units by mouth every Monday.  Patient not taking: Reported on 11/02/2023    [provider]  escitalopram  (LEXAPRO ) 20 MG tablet Take 20 mg by mouth daily.  01/29/20   [provider]  gabapentin  (NEURONTIN ) 300 MG capsule Take 300 mg by mouth 2 (two) times daily.    [provider]  glipiZIDE  (GLUCOTROL  XL) 10 MG 24 hr tablet Take 10 mg by mouth daily with breakfast.  06/07/20   [provider]  loratadine  (CLARITIN ) 10 MG tablet Take 10 mg by mouth daily.     [provider]  oxyCODONE -acetaminophen  (PERCOCET) 5-325 MG tablet Take 1-2 tablets by mouth every 4 (four) hours as needed for moderate pain (pain score 4-6) or severe pain (pain score 7-10). 11/17/23   Mavis Purchase, MD  rosuvastatin  (CRESTOR ) 10 MG tablet Take 10 mg by mouth at bedtime. 01/31/21   [provider]  warfarin (COUMADIN ) 5 MG tablet Take 5-7.5 mg by mouth See admin instructions. Take 5 mg by mouth on Sunday, Monday, Tuesday, Wednesday, Thursday, and Friday. Take 7.5 mg by mouth once daily on Saturday. 03/23/19   [provider]    Allergies as of 06/30/2024 - Review Complete 01/19/2024  Allergen Reaction Noted   Losartan potassium-hctz Other (See Comments) 10/29/2013   Morphine  and codeine Itching 04/18/2020    Family History  Problem Relation Age of Onset   Dementia Mother    Pancreatic cancer Father    Prostate cancer Paternal Grandmother    Leukemia Nephew     Social History   Socioeconomic History   Marital status: Single    Spouse name: Not on file   Number of children: 0   Years of education: Not on file   Highest education level: Not on file  Occupational History    Comment: heavy arboriculturist (work for himself)  Tobacco Use   Smoking status: Former    Current packs/day: 0.00    Types: Cigarettes    Quit date: 1981    Years since quitting: 44.9   Smokeless tobacco: Never  Vaping Use   Vaping status: Never Used  Substance and Sexual Activity   Alcohol use: Never   Drug use: Never   Sexual activity: Not Currently  Other Topics Concern   Not on file  Social History Narrative   Lives by himself..   Social Drivers of Health   Financial Resource Strain: Low Risk  (06/28/2024)   Received from [facility]   Overall Financial Resource Strain (CARDIA)    Difficulty of Paying Living Expenses: Not hard at all  Food Insecurity: No Food Insecurity (06/28/2024)   Received from [facility]   Hunger Vital Sign    Within the past 12 months, you worried that your food would run out before you got the money to buy more.: Never true    Within the past 12 months, the food you bought just didn't last and you  didn't have money to get more.: Never true  Transportation Needs: No Transportation Needs (06/28/2024)   Received from [facility] - Transportation    In the past 12 months, has lack of transportation kept you from medical appointments or from getting medications?: No    Lack of Transportation (Non-Medical): No  Physical Activity: Not on file  Stress: Not on file  Social Connections: Not on file  Intimate Partner Violence: Not At Risk (02/12/2023)   Humiliation, Afraid, Rape, and Kick questionnaire    Fear of Current or Ex-Partner: No    Emotionally Abused: No    Physically Abused: No    Sexually Abused: No    Review of Systems: See HPI, otherwise negative ROS  Physical Exam: BP (!) 119/102   Temp (!) 96.4 F (35.8 C) (Tympanic)   Resp 20   Ht 6' 2 (1.88 m)   Wt 100.2 kg   SpO2 96%  BMI 28.37 kg/m  General:   Alert,  pleasant and cooperative in NAD Head:  Normocephalic and atraumatic. Neck:  Supple; no masses or thyromegaly. Lungs:  Clear throughout to auscultation, normal respiratory effort.    Heart:  +S1, +S2, Regular rate and rhythm, No edema. Abdomen:  Soft, nontender and nondistended. Normal bowel sounds, without guarding, and without rebound.   Neurologic:  Alert and  oriented x4;  grossly normal neurologically.  Impression/Plan: Eddie Ryan is here for an colonoscopy to be performed for Screening colonoscopy average risk   Risks, benefits, limitations, and alternatives regarding  colonoscopy have been reviewed with the patient.  Questions have been answered.  All parties agreeable.   Ruel Kung, MD  07/20/2024, 7:41 AM

## 2024-07-20 NOTE — Transfer of Care (Signed)
 Immediate Anesthesia Transfer of Care Note  Patient: Eddie Ryan  Procedure(s) Performed: COLONOSCOPY POLYPECTOMY, INTESTINE CONTROL OF HEMORRHAGE, GI TRACT, ENDOSCOPIC, BY CLIPPING OR OVERSEWING INJECTION, SUBMUCOSAL  Patient Location: PACU  Anesthesia Type:General  Level of Consciousness: drowsy  Airway & Oxygen Therapy: Patient Spontanous Breathing  Post-op Assessment: Report given to RN and Post -op Vital signs reviewed and stable  Post vital signs: Reviewed and stable  Last Vitals:  Vitals Value Taken Time  BP 113/78 07/20/24 08:57  Temp    Pulse 66 07/20/24 08:58  Resp 13 07/20/24 08:58  SpO2 96 % 07/20/24 08:58  Vitals shown include unfiled device data.  Last Pain:  Vitals:   07/20/24 0726  TempSrc: Tympanic  PainSc: 0-No pain         Complications: No notable events documented.

## 2024-07-20 NOTE — Anesthesia Preprocedure Evaluation (Addendum)
 Anesthesia Evaluation  Patient identified by MRN, date of birth, ID band Patient awake    Reviewed: Allergy & Precautions, H&P , NPO status , Patient's Chart, lab work & pertinent test results  Airway Mallampati: II  TM Distance: >3 FB Neck ROM: Full    Dental no notable dental hx. (+) Teeth Intact, Dental Advisory Given   Pulmonary sleep apnea , former smoker   Pulmonary exam normal breath sounds clear to auscultation       Cardiovascular hypertension, Pt. on medications  Rhythm:Regular Rate:Normal     Neuro/Psych  Headaches  Anxiety Depression       GI/Hepatic negative GI ROS, Neg liver ROS,,,  Endo/Other  diabetes, Type 2, Oral Hypoglycemic Agents    Renal/GU Renal disease  negative genitourinary   Musculoskeletal  (+) Arthritis , Osteoarthritis,    Abdominal Normal abdominal exam  (+)   Peds  Hematology negative hematology ROS (+)   Anesthesia Other Findings   Reproductive/Obstetrics negative OB ROS                              Anesthesia Physical Anesthesia Plan  ASA: 3  Anesthesia Plan: General   Post-op Pain Management:    Induction: Intravenous  PONV Risk Score and Plan: 3 and Propofol  infusion and TIVA  Airway Management Planned: Natural Airway  Additional Equipment:   Intra-op Plan:   Post-operative Plan:   Informed Consent: I have reviewed the patients History and Physical, chart, labs and discussed the procedure including the risks, benefits and alternatives for the proposed anesthesia with the patient or authorized representative who has indicated his/her understanding and acceptance.       Plan Discussed with: CRNA  Anesthesia Plan Comments: ( )        Anesthesia Quick Evaluation

## 2024-07-20 NOTE — Op Note (Signed)
 [facility] Patient Name: Eddie Ryan Procedure Date: 07/20/2024 8:09 AM MRN: [id number] Account #: [id number] Date of Birth: 13-Mar-1956 Admit Type: Outpatient Age: 68 Room: [facility] ENDO ROOM 3 Gender: Male Note Status: Finalized Instrument Name: Colon Scope [id number] Procedure:             Colonoscopy Indications:           Screening for colorectal malignant neoplasm Providers:             Ruel Kung MD, MD Referring MD:          Oneil PHEBE Pinal, MD (Referring MD) Medicines:             Monitored Anesthesia Care Complications:         No immediate complications. Procedure:             Pre-Anesthesia Assessment:                        - Prior to the procedure, a History and Physical was                         performed, and patient medications, allergies and                         sensitivities were reviewed. The patient's tolerance                         of previous anesthesia was reviewed.                        - The risks and benefits of the procedure and the                         sedation options and risks were discussed with the                         patient. All questions were answered and informed                         consent was obtained.                        - ASA Grade Assessment: II - A patient with mild                         systemic disease.                        After obtaining informed consent, the colonoscope was                         passed under direct vision. Throughout the procedure,                         the patient's blood pressure, pulse, and oxygen                         saturations were monitored continuously. The                         Colonoscope  was introduced through the anus and                         advanced to the the cecum, identified by the                         appendiceal orifice. Findings:      The perianal and digital rectal examinations were normal.      Nine sessile polyps were found  in the ascending colon. The polyps were 5       to 9 mm in size. These polyps were removed with a cold snare. Resection       and retrieval were complete.      A 20 mm polyp was found in the proximal ascending colon. The polyp was       semi-sessile. Preparations were made for mucosal resection. Demarcation       of the lesion was performed during the procedure, with high-definition       white light to clearly identify the boundaries of the lesion. Saline was       injected to raise the lesion. Snare mucosal resection was performed.       Resection and retrieval were complete. Resected tissue margins were       examined and clear of polyp tissue. To prevent bleeding after mucosal       resection, two hemostatic clips were successfully placed. Clip       manufacturer: [employer]. There was no bleeding at the end of the       procedure.      Three sessile polyps were found in the ascending colon. The polyps were       5 to 8 mm in size. These polyps were removed with a cold snare.       Resection and retrieval were complete.      A 10 mm polyp was found in the descending colon. The polyp was       semi-sessile. The polyp was removed with a hot snare. Resection and       retrieval were complete. To prevent bleeding post-intervention, one       hemostatic clip was successfully placed. Clip manufacturer: [employer]. There was no bleeding at the end of the procedure.      The exam was otherwise without abnormality on direct and retroflexion       views. Impression:            - Nine 5 to 9 mm polyps in the ascending colon,                         removed with a cold snare. Resected and retrieved.                        - One 20 mm polyp in the proximal ascending colon,                         removed with mucosal resection. Resected and                         retrieved. Clips were placed. Clip manufacturer:  [employer].                         - Three 5 to 8 mm polyps in the ascending colon,                         removed with a cold snare. Resected and retrieved.                        - One 10 mm polyp in the descending colon, removed                         with a hot snare. Resected and retrieved. Clip was                         placed. Clip manufacturer: [employer].                        - The examination was otherwise normal on direct and                         retroflexion views.                        - Mucosal resection was performed. Resection and                         retrieval were complete. Recommendation:        - Discharge patient to home (with escort).                        - Resume previous diet.                        - Continue present medications.                        - Await pathology results.                        - Repeat colonoscopy in 1 year for surveillance. Procedure Code(s):     --- Professional ---                        [id number], Colonoscopy, flexible; with endoscopic mucosal                         resection                        [id number], 59, Colonoscopy, flexible; with removal of                         tumor(s), polyp(s), or other lesion(s) by snare                         technique Diagnosis Code(s):     --- Professional ---                        Z12.11, Encounter for screening for malignant neoplasm  of colon                        D12.4, Benign neoplasm of descending colon                        D12.2, Benign neoplasm of ascending colon CPT copyright 2022 [employer]. All rights reserved. The codes documented in this report are preliminary and upon coder review may  be revised to meet current compliance requirements. Ruel Kung, MD Ruel Kung MD, MD 07/20/2024 8:57:06 AM This report has been signed electronically. Number of Addenda: 0 Note Initiated On: 07/20/2024 8:09 AM Scope Withdrawal Time: 0 hours 18 minutes 25 seconds  Total  Procedure Duration: 0 hours 25 minutes 37 seconds  Estimated Blood Loss:  Estimated blood loss: none.      [facility]

## 2024-07-20 NOTE — Anesthesia Postprocedure Evaluation (Signed)
 Anesthesia Post Note  Patient: Eddie Ryan  Procedure(s) Performed: COLONOSCOPY POLYPECTOMY, INTESTINE CONTROL OF HEMORRHAGE, GI TRACT, ENDOSCOPIC, BY CLIPPING OR OVERSEWING INJECTION, SUBMUCOSAL  Patient location during evaluation: Endoscopy Anesthesia Type: General Level of consciousness: awake and alert Pain management: pain level controlled Vital Signs Assessment: post-procedure vital signs reviewed and stable Respiratory status: spontaneous breathing, nonlabored ventilation and respiratory function stable Cardiovascular status: blood pressure returned to baseline and stable Postop Assessment: no apparent nausea or vomiting Anesthetic complications: no   No notable events documented.   Last Vitals:  Vitals:   07/20/24 0914 07/20/24 0920  BP: (!) 122/100 (!) 133/93  Pulse: 68 60  Resp: 15 16  Temp: (!) 35.8 C (!) 35.8 C  SpO2: 97%     Last Pain:  Vitals:   07/20/24 0920  TempSrc: Temporal  PainSc: 0-No pain                 Camellia Merilee Louder

## 2024-07-21 LAB — SURGICAL PATHOLOGY

## 2024-07-22 ENCOUNTER — Ambulatory Visit: Payer: Self-pay | Admitting: Gastroenterology

## 2024-08-08 ENCOUNTER — Encounter: Payer: Self-pay | Admitting: Oncology

## 2024-08-08 ENCOUNTER — Inpatient Hospital Stay: Attending: Oncology

## 2024-08-08 ENCOUNTER — Inpatient Hospital Stay: Admitting: Oncology
